# Patient Record
Sex: Female | Born: 1945
Health system: Southern US, Community
[De-identification: ages and names within clinical notes are randomized; demographics above are authoritative.]

## PROBLEM LIST (undated history)

## (undated) DIAGNOSIS — J449 Chronic obstructive pulmonary disease, unspecified: Secondary | ICD-10-CM

## (undated) DIAGNOSIS — F319 Bipolar disorder, unspecified: Secondary | ICD-10-CM

## (undated) DIAGNOSIS — I10 Essential (primary) hypertension: Secondary | ICD-10-CM

## (undated) DIAGNOSIS — R079 Chest pain, unspecified: Secondary | ICD-10-CM

## (undated) DIAGNOSIS — M1990 Unspecified osteoarthritis, unspecified site: Secondary | ICD-10-CM

## (undated) DIAGNOSIS — R0602 Shortness of breath: Secondary | ICD-10-CM

## (undated) DIAGNOSIS — M797 Fibromyalgia: Secondary | ICD-10-CM

## (undated) DIAGNOSIS — F909 Attention-deficit hyperactivity disorder, unspecified type: Secondary | ICD-10-CM

## (undated) DIAGNOSIS — I340 Nonrheumatic mitral (valve) insufficiency: Secondary | ICD-10-CM

## (undated) DIAGNOSIS — M48061 Spinal stenosis, lumbar region without neurogenic claudication: Secondary | ICD-10-CM

## (undated) DIAGNOSIS — M1712 Unilateral primary osteoarthritis, left knee: Secondary | ICD-10-CM

## (undated) DIAGNOSIS — E785 Hyperlipidemia, unspecified: Secondary | ICD-10-CM

## (undated) DIAGNOSIS — Z95810 Presence of automatic (implantable) cardiac defibrillator: Secondary | ICD-10-CM

## (undated) DIAGNOSIS — E079 Disorder of thyroid, unspecified: Secondary | ICD-10-CM

## (undated) DIAGNOSIS — G931 Anoxic brain damage, not elsewhere classified: Secondary | ICD-10-CM

## (undated) DIAGNOSIS — H543 Unqualified visual loss, both eyes: Secondary | ICD-10-CM

## (undated) DIAGNOSIS — I469 Cardiac arrest, cause unspecified: Secondary | ICD-10-CM

## (undated) HISTORY — DX: Presence of automatic (implantable) cardiac defibrillator: Z95.810

## (undated) HISTORY — DX: Anoxic brain damage, not elsewhere classified: G93.1

## (undated) HISTORY — DX: Cardiac arrest, cause unspecified: I46.9

## (undated) HISTORY — PX: PACEMAKER INSERTION: SHX728

## (undated) HISTORY — DX: Unqualified visual loss, both eyes: H54.3

## (undated) HISTORY — DX: Nonrheumatic mitral (valve) insufficiency: I34.0

## (undated) HISTORY — DX: Unilateral primary osteoarthritis, left knee: M17.12

## (undated) HISTORY — PX: TONSILLECTOMY: SUR1361

## (undated) HISTORY — DX: Spinal stenosis, lumbar region without neurogenic claudication: M48.061

## (undated) HISTORY — PX: ABDOMINAL HYSTERECTOMY: SHX81

## (undated) HISTORY — DX: Disorder of thyroid, unspecified: E07.9

## (undated) HISTORY — DX: Hyperlipidemia, unspecified: E78.5

## (undated) HISTORY — DX: Chronic obstructive pulmonary disease, unspecified: J44.9

## (undated) HISTORY — DX: Fibromyalgia: M79.7

## (undated) HISTORY — PX: COLON SURGERY: SHX602

## (undated) HISTORY — DX: Attention-deficit hyperactivity disorder, unspecified type: F90.9

## (undated) HISTORY — DX: Bipolar disorder, unspecified: F31.9

## (undated) HISTORY — PX: HAND SURGERY: SHX662

---

## 1989-10-17 HISTORY — PX: APPENDECTOMY: SHX54

## 1989-10-17 HISTORY — PX: TOTAL VAGINAL HYSTERECTOMY: SHX2548

## 2010-03-01 ENCOUNTER — Encounter: Payer: Self-pay | Admitting: Gastroenterology

## 2010-03-19 ENCOUNTER — Encounter: Payer: Self-pay | Admitting: Cardiology

## 2010-03-22 ENCOUNTER — Other Ambulatory Visit: Payer: Self-pay | Admitting: Gastroenterology

## 2010-03-22 ENCOUNTER — Ambulatory Visit
Admit: 2010-03-22 | Discharge: 2010-03-22 | Disposition: A | Discharge status: Home / Self Care | Payer: Self-pay | Source: Ambulatory Visit | Attending: Cardiology | Admitting: Cardiology

## 2010-03-22 HISTORY — DX: Chest pain, unspecified: R07.9

## 2010-03-22 HISTORY — DX: Essential (primary) hypertension: I10

## 2010-03-22 HISTORY — DX: Shortness of breath: R06.02

## 2010-03-22 HISTORY — DX: Unspecified osteoarthritis, unspecified site: M19.90

## 2010-03-22 MED ORDER — HEPARIN SODIUM (PORCINE) 1000 UNIT/ML IJ SOLN *WRAPPED*
Status: AC
Start: 2010-03-22 — End: 2010-03-22
  Filled 2010-03-22: qty 10

## 2010-03-22 MED ORDER — ASPIRIN 81 MG PO CHEW *I*
324.0000 mg | CHEWABLE_TABLET | Freq: Every day | ORAL | Status: AC
Start: 2010-03-22 — End: 2010-03-22
  Administered 2010-03-22: 324 mg via ORAL

## 2010-03-22 MED ORDER — ASPIRIN 81 MG PO CHEW *I*
CHEWABLE_TABLET | ORAL | Status: AC
Start: 2010-03-22 — End: 2010-03-22
  Filled 2010-03-22: qty 4

## 2010-03-22 MED ORDER — SODIUM CHLORIDE 0.9 % IV SOLN WRAPPED *I*
25.0000 mL/h | Status: DC
Start: 2010-03-22 — End: 2010-03-23
  Administered 2010-03-22: 25 mL/h via INTRAVENOUS

## 2010-03-22 MED ORDER — ACETAMINOPHEN 325 MG PO TABS *I*
325.0000 mg | ORAL_TABLET | ORAL | Status: DC | PRN
Start: 2010-03-22 — End: 2010-03-23

## 2010-03-22 MED ORDER — LIDOCAINE HCL 1 % IJ SOLN
INTRAMUSCULAR | Status: AC
Start: 2010-03-22 — End: 2010-03-22
  Filled 2010-03-22: qty 20

## 2010-03-22 MED ORDER — MIDAZOLAM HCL 1 MG/ML IJ SOLN *I* WRAPPED
INTRAMUSCULAR | Status: AC
Start: 2010-03-22 — End: 2010-03-22
  Filled 2010-03-22: qty 5

## 2010-03-22 MED ORDER — FENTANYL CITRATE 50 MCG/ML IJ SOLN *WRAPPED*
INTRAMUSCULAR | Status: AC
Start: 2010-03-22 — End: 2010-03-22
  Filled 2010-03-22: qty 2

## 2010-03-22 NOTE — Discharge Instructions (Signed)
[facility]  Cardiac Catheterization  Electrophysiology Labs  Patient Discharge Instructions      Date: 03/22/2010   Attending Physician: Dr Lenn Sink                                      Procedure: Left Heart Catheterization      FOLLOW-UP CARE  Call for an appointment with Dr. Corlis Leak  [address]  [address]  [place], [place] [address] [phone]  In 3 days as follow-up visit appointment post angiogram.    INSTRUCTIONS    Notify Dr Lennette Bihari, MD promptly if you experience any of the following symptoms: chest pain, shortness of breath, lightheadedness.    If you notice a sudden bright red bleeding or swelling at procedure site, apply firm pressure above the site and call 911.    If signs of infection such as redness, swelling, increased pain or fever, please call one of the following numbers:     Cardiac Catheterization Lab: [phone] between the hours of 8 am and 4:30 pm on Mon - Fri but during weekends, holidays and evening/night hours call:   Cardiac Cath Lab: [phone]   Electrophysiology Study Lab: [phone]    DIET    Resume your previous diet.  Do not drink alcoholic beverages for the next 24 hours.  Drink 8 glasses of water a day for 3 days.    CARE OF DRESSING OR INCISION    Keep incision dry and clean.  Remove dressing tomorrow.    PAIN MANAGEMENT/OTHER    Make no major decisions for the next 24 hours.  You have received medication that may make you sleepy.  Do not drive, drink alcohol, or operate machinery for 2 days.  For mild pain you can take Acetaminophen/Tylenol.    ACTIVITY    No lifting, pushing or pulling more than 5 lbs. (e.g. shoveling, mowing, raking, vacuuming) for 3-5 days.  Minimal stair climbing.    BATHING/SHOWERING    May use a pool, hot tub, in 7 days.  May shower next day.    PRESCRIPTIONS  Medication: no new medications.  Resume home meds.        Smoking Cessation:  Smoking counseling and booklet provided.  Refuses [facility] referral.  Not  ready to quit at this time.                               Bloodwork:   n/a    Additional Written Instructions Provided to Patient: No  If yes, specify: n/a    Provider Signature: Sandford Craze, NP  Date/Time: 03/22/2010 9:53 AM    Instructions reviewed with patient by: ______________________________________        Signature/Title   Date    I HAVE RECEIVED AND UNDERSTAND INSTRUCTIONS PROVIDED  ______________________________________________________________________    Signature of Patient or Significant Other (Enter Relationship to Patient if not Patient)

## 2010-03-22 NOTE — Progress Notes (Signed)
64 year old woman with chronic smoking history who presents with 3 years of chest heaviness for moderate exertion. However since the past few months symptoms have become more frequent and activity limiting. She had symptoms recently while in her PCP's office. She was referred to [facility] this. Based on her symptoms and risk factors she was considered high probability for CAD as an etiology for symptoms. She is referred for coronary angiogram today.      History:  Past Medical History   Diagnosis Date   . Hypertension    . Angina    . Chest pain, unspecified      pressure   . Shortness of breath    . Arthritis          Allergies: Allergies   Allergen Reactions   . Statins Support Therapy Rash           Prior to Admission Medications:      (Not in a hospital admission)      Active Hospital Medications:  Current outpatient prescriptions   Medication   . oxycodone-acetaminophen (PERCOCET) 5-325 MG per tablet   . alprazolam (XANAX) 0.5 MG tablet   . zolpidem (AMBIEN) 5 MG tablet   . ezetimibe (ZETIA) 10 MG tablet   . lisinopril (PRINIVIL,ZESTRIL) 20 MG tablet   . pregabalin (LYRICA) 75 MG capsule   . nitroGLYCERIN (NITROSTAT) 0.4 MG SL tablet   . diphenhydrAMINE (BENADRYL) 25 MG tablet   . aspirin 81 MG EC tablet   . Calcium Carbonate-Vitamin D (CALCIUM + D) 600-200 MG-UNIT per tablet   . Multiple Vitamin (MULTIVITAMIN) per tablet   . NONFORMULARY, OTHER, ORDER   Current facility-administered medications   Medication Dose Route Frequency   . sodium chloride IV  25 mL/hr Intravenous Continuous   . aspirin chewable tablet 324 mg  324 mg Oral Daily            Objective:     Physical Exam  Vitals:  Blood pressure 141/65, pulse 40, temperature 36.7 C (98.1 F), temperature source Temporal, resp. rate 20, height 1.524 m (5'), weight 68.5 kg (151 lb 0.2 oz), SpO2 98.00%.       Allens test:  normal    Pulses:   L radial 2 R radial 2   L Femoral 1 R Femoral 1   L posterior tibial 1 R posterior tibial 1   L dorsalis pedis 1 R  dorsalis pedis 1       Jugular venous pressure: normal  Airway Visibility: soft palate, uvula and posterior pharynx  Neuro exam: awake, alert, and oriented x 3  Breath sounds: clear  Cardiovascular:  normal S1,S2 without murmur, rubs, gallops  Abdomen:      Lab Review     No results found for this basename: NA,K,BUN,CREATININE,EGFR,HGB,HCT,PLT,INR in the last 168 hours      Anesthesiologist's Physical Status rating of the patient: Class III: Severe Systemic Disease    Plan for sedation: Moderate  I am evaluating the patient immediately prior to admission of sedation medication. The plan for sedation remains appropriate.      Assessment:    Indications for procedure: exertional angina     Plan:   Cardiac cath to rule out ischemic CAD.  8:37 AM  03/22/2010  [facility] RAO, MBBS    I personally saw and evaluated the patient.  I agree with the findings and care plan as documented above.

## 2010-03-24 LAB — POCT ASPIRIN PLATELET INHIBITION: Aspirin PLT Inhibition,POC: 405 {ARU}

## 2010-03-26 LAB — VRE SURVEILLANCE: VRE Surveillance: 0

## 2011-03-18 HISTORY — PX: REVISION TOTAL HIP ARTHROPLASTY: SHX766

## 2012-11-07 ENCOUNTER — Encounter: Payer: Self-pay | Admitting: Neurosurgery

## 2012-11-07 ENCOUNTER — Ambulatory Visit: Payer: Self-pay | Admitting: Neurosurgery

## 2012-11-07 VITALS — BP 132/78 | HR 76 | Ht 60.0 in | Wt 172.0 lb

## 2012-11-07 DIAGNOSIS — R200 Anesthesia of skin: Secondary | ICD-10-CM

## 2012-11-07 NOTE — Progress Notes (Signed)
Rinaldo Cloud, M.D.  [address]  [place], [place] [address]    Dear Dr. Delbert Phenix,    I had the pleasure of seeing Ms. Mashaw on 11-07-12 at 10 AM regarding history of left hand numbness.  As you will recall, the patient is a 67 year old right-handed divorced woman who recently worked factors with repetitive work.  Patient has a history of carpal tunnel for which she was evidently seen at [facility]. [facility] in 2010.  At that time, an EMG examination was performed by myself demonstrating a mild left carpal tunnel syndrome.  She was also noted to have a mild left C6 cervical radiculopathy at that time.  The patient indicates that she has continued to have numbness of both hands, predominantly the left hand.  She feels that the "whole hand" may go numb and that her symptoms "come and go".  She feels her right handgrip is weaker than it had been in the past.  Additionally, her left hand grip seemed to be weaker though she is not aware of specific numbness as she has experienced with the left hand.  She is having increased difficulties with activities of daily living.    Review of systems:    She's had a history of chronic low back pain as well as history of nausea at times.  All other review of systems are negative except for history of present illness and past medical history.    Social history:    History     Social History   . Marital Status: Divorced     Spouse Name: N/A     Number of Children: N/A   . Years of Education: N/A     Social History Main Topics   . Smoking status: Current Every Day Smoker -- 1.00 packs/day for 50 years   . Smokeless tobacco: Never Used   . Alcohol Use: No   . Drug Use: No   . Sexually Active: None     Other Topics Concern   . None     Social History Narrative   . None         Family history:    Family History   Problem Relation Age of Onset   . Heart disease Mother    . Heart disease Father    . Cancer Sister      Leukemia         Medications:    Current Outpatient Prescriptions    Medication Sig Dispense Refill   . fluticasone (FLONASE) 50 MCG/ACT nasal spray 2 sprays by Nasal route daily       . pitavastatin (LIVALO) 4 MG tablet Take 4 mg by mouth daily       . alendronate (FOSAMAX) 70 MG tablet Take 70 mg by mouth every 7 days   Take with a full glass of water. Do not eat or lie down for 30 min.       . metoprolol (TOPROL-XL) 25 MG 24 hr tablet Take 25 mg by mouth daily   Do not crush or chew. May be divided.       Marland Kitchen lisinopril-hydrochlorothiazide (PRINZIDE,ZESTORETIC) 20-12.5 MG per tablet Take 1 tablet by mouth every morning       . cetirizine (ZYRTEC) 10 MG tablet Take 10 mg by mouth daily       . isosorbide mononitrate (IMDUR) 30 MG 24 hr tablet Take 30 mg by mouth daily       . albuterol-ipratropium (COMBIVENT) 18-103 MCG/ACT inhaler Inhale  2 puffs into the lungs daily   Shake well before each use.       . methylphenidate (RITALIN) 10 MG tablet Take 10 mg by mouth 2 times daily (before meals)          . QUEtiapine (SEROQUEL) 50 MG tablet Take 50 mg by mouth 2 times daily       . melatonin 3 MG Take 3 mg by mouth nightly as needed       . Ascorbic Acid (VITAMIN C) 250 MG tablet Take 250 mg by mouth daily.       Marland Kitchen oxycodone-acetaminophen (PERCOCET) 5-325 MG per tablet Take 1 tablet by mouth every 4 hours as needed.       Marland Kitchen alprazolam (XANAX) 0.5 MG tablet Take 0.5 mg by mouth 2 times daily as needed.       . ezetimibe (ZETIA) 10 MG tablet Take 10 mg by mouth daily.       . pregabalin (LYRICA) 75 MG capsule Take 75 mg by mouth 2 times daily.       . nitroGLYCERIN (NITROSTAT) 0.4 MG SL tablet Place 0.4 mg under the tongue every 5 minutes as needed. May repeat x2, if pain persists call 911        . aspirin 81 MG EC tablet Take 81 mg by mouth daily.       . Calcium Carbonate-Vitamin D (CALCIUM + D) 600-200 MG-UNIT per tablet Take 2 tablets by mouth daily.       . Multiple Vitamin (MULTIVITAMIN) per tablet Take 1 tablet by mouth daily.       Marland Kitchen zolpidem (AMBIEN) 5 MG tablet Take 10 mg by  mouth nightly as needed.         No current facility-administered medications for this visit.         Allergies:  Seasonal and Statins support therapy      Past medical history:    Past Medical History   Diagnosis Date   . Hypertension    . Angina    . Chest pain, unspecified      pressure   . Shortness of breath    . Arthritis    . COPD (chronic obstructive pulmonary disease)    . ADHD (attention deficit hyperactivity disorder)    . Fibromyalgia    . Bipolar 1 disorder          Past surgical history:      Past Surgical History   Procedure Laterality Date   . Tonsillectomy     . Colon surgery       abcess   . Hand surgery Bilateral      trigger finger bilateral         Examination:  Filed Vitals:    11/07/12 1005   BP: 132/78   Pulse: 76   Height: 1.524 m (5')   Weight: 78.019 kg (172 lb)       Cardiac auscultation and auscultation of the carotids and ocular orbits were unremarkable.    Neurologic examination:  Mental status:  The patient is awake, alert, and fluent.  She appears in no acute distress.    Cranial nerves:  Examination of cranial nerves II through XII reveal pupils to be equal and reactive to light, extraocular movements are intact.  Facial features are symmetric.  Tongue is midline, soft palate elevates symmetrically.  Hearing is intact to finger rub bilaterally.    Motor examination:  On motor examination, she has normal strength, tone,  and bulk throughout the 4 extremities, both proximally and distally.    Sensory examination:  On sensory examination, she has 22(R), 25(L) seconds of vibration at the distal index finger bilaterally in the upper extremities and 8 seconds of vibratory sensation at the distal first toe in the lower extremities bilaterally.    Deep tendon reflexes:  She has 2+ responses of the biceps, triceps, brachioradialis, knees, and ankles bilaterally.  Plantar responses are downgoing bilaterally.  Tinel sign was absent at both wrists.    Coordination:  Normal including  gait.    Impression:  1.  At the present time, the patient has history of left carpal tunnel syndrome.  She believes that this has been progressing.  Additionally, she seems to be experiencing some increased symptomatology the right upper extremity as well.  The possible cervical radiculopathies is not excluded.    Recommendations:  1.  The patient is offered EMG examination of bilateral upper extremities which will be scheduled for her.    2.  She is also asked to sign release of records from [facility]. [facility].     3.  Followup one month.    Thank you for your referral of Ms. Freida Busman for consultation.  I hope these observations and information will be helpful to you in your follow-up with her in the future.  Please feel free to contact me if I can be of further assistance.    Sincerely,        Aurora Mask, M.D.

## 2012-12-12 ENCOUNTER — Ambulatory Visit: Payer: Self-pay | Admitting: Neurosurgery

## 2012-12-17 ENCOUNTER — Ambulatory Visit: Payer: Self-pay | Admitting: Neurosurgery

## 2013-01-09 ENCOUNTER — Ambulatory Visit: Payer: Self-pay | Admitting: Neurosurgery

## 2013-01-09 ENCOUNTER — Encounter: Payer: Self-pay | Admitting: Neurosurgery

## 2013-01-09 ENCOUNTER — Encounter: Payer: Self-pay | Admitting: Gastroenterology

## 2013-01-09 VITALS — BP 110/60 | HR 72 | Ht 60.0 in | Wt 175.5 lb

## 2013-01-09 DIAGNOSIS — G5603 Carpal tunnel syndrome, bilateral upper limbs: Secondary | ICD-10-CM

## 2013-01-09 DIAGNOSIS — M5412 Radiculopathy, cervical region: Secondary | ICD-10-CM

## 2013-01-09 NOTE — Procedures (Signed)
This office note has been dictated.  (Media)

## 2013-01-09 NOTE — Progress Notes (Signed)
Filed Vitals:    01/09/13 1351   BP: 110/60   Pulse: 72   Height: 1.524 m (5')   Weight: 79.606 kg (175 lb 8 oz)     Patient seen on 01-09-13 for EMG examination.  See procedure note(Media).    Farley Ly.D.

## 2013-01-21 ENCOUNTER — Ambulatory Visit: Payer: Self-pay | Admitting: Neurosurgery

## 2013-01-21 ENCOUNTER — Encounter: Payer: Self-pay | Admitting: Neurosurgery

## 2013-01-21 VITALS — BP 116/64 | HR 80 | Ht 60.0 in | Wt 176.0 lb

## 2013-01-21 DIAGNOSIS — G5603 Carpal tunnel syndrome, bilateral upper limbs: Secondary | ICD-10-CM

## 2013-01-21 NOTE — Progress Notes (Signed)
Filed Vitals:    01/21/13 1113   BP: 116/64   Pulse: 80   Height: 1.524 m (5')   Weight: 79.833 kg (176 lb)     Patient seen in followup on 01-21-13 at 11 AM regarding bilateral carpal tunnel syndrome.    As noted previously in 2010, the patient had an EMG examination performed of the left upper extremity.  That exam demonstrated a left carpal tunnel syndrome that was mild in degree.  She indicates that she "baby it" such that it has improved somewhat.  On the other hand, she has a mild-moderate right carpal tunnel syndrome on her recent examination.    The patient was counseled with regards to consideration of wrist splinting and occupational therapy consultation regarding the right hand symptoms.  At the present time, she indicates that she does not wish any referrals and insists that if she is careful with the right hand, this will also improve.  She was further advised with regards to the potential further injury of the median nerve and its potential for disability of hand grip with thenar atrophy and loss of sensation.  She understands the risks and is willing to accept the risks of potential further injury to her hand function.    Current Outpatient Prescriptions   Medication Sig Dispense Refill   . pitavastatin (LIVALO) 4 MG tablet Take 4 mg by mouth daily       . alendronate (FOSAMAX) 70 MG tablet Take 70 mg by mouth every 7 days   Take with a full glass of water. Do not eat or lie down for 30 min.       . metoprolol (TOPROL-XL) 25 MG 24 hr tablet Take 25 mg by mouth daily   Do not crush or chew. May be divided.       Marland Kitchen lisinopril-hydrochlorothiazide (PRINZIDE,ZESTORETIC) 20-12.5 MG per tablet Take 1 tablet by mouth every morning       . cetirizine (ZYRTEC) 10 MG tablet Take 10 mg by mouth daily       . isosorbide mononitrate (IMDUR) 30 MG 24 hr tablet Take 30 mg by mouth daily       . albuterol-ipratropium (COMBIVENT) 18-103 MCG/ACT inhaler Inhale 2 puffs into the lungs daily   Shake well before each use.        . methylphenidate (RITALIN) 10 MG tablet Take 10 mg by mouth 2 times daily (before meals)          . QUEtiapine (SEROQUEL) 50 MG tablet Take 150 mg by mouth nightly          . melatonin 3 MG Take 3 mg by mouth nightly as needed       . Ascorbic Acid (VITAMIN C) 250 MG tablet Take 250 mg by mouth daily.       Marland Kitchen oxycodone-acetaminophen (PERCOCET) 5-325 MG per tablet Take 1 tablet by mouth every 4 hours as needed.       Marland Kitchen alprazolam (XANAX) 0.5 MG tablet Take 0.5 mg by mouth 2 times daily as needed.       . ezetimibe (ZETIA) 10 MG tablet Take 10 mg by mouth daily.       . pregabalin (LYRICA) 75 MG capsule Take 75 mg by mouth 2 times daily.       . nitroGLYCERIN (NITROSTAT) 0.4 MG SL tablet Place 0.4 mg under the tongue every 5 minutes as needed. May repeat x2, if pain persists call 911        .  aspirin 81 MG EC tablet Take 81 mg by mouth daily.       . Calcium Carbonate-Vitamin D (CALCIUM + D) 600-200 MG-UNIT per tablet Take 2 tablets by mouth daily.       . Multiple Vitamin (MULTIVITAMIN) per tablet Take 1 tablet by mouth daily.         No current facility-administered medications for this visit.     Examination:  Patient is awake alert and fluent.  Patient appears in no acute distress.    Gait is normal.    Impression:  1.  Bilateral carpal tunnel syndrome as described above.  Patient currently refuses any specific intervention.  However, she will let me know if she changes her mind.  Previously demonstrated mild C6 radiculopathy on the left has been stable and does not appear to require any intervention presently.    Recommendations:  1.  She will plan to followup on a when necessary basis.    Aurora Mask M.D.  Copy to:  Rinaldo Cloud, M.D.  [address]  [place], [place] [address]

## 2014-12-19 ENCOUNTER — Telehealth: Payer: Self-pay

## 2015-01-07 NOTE — Telephone Encounter (Signed)
error 

## 2015-01-15 ENCOUNTER — Ambulatory Visit
Admit: 2015-01-15 | Discharge: 2015-01-15 | Disposition: A | Discharge status: Home / Self Care | Payer: Self-pay | Source: Ambulatory Visit | Attending: Colon and Rectal Surgery | Admitting: Colon and Rectal Surgery

## 2015-01-16 LAB — SURGICAL PATHOLOGY

## 2015-08-03 ENCOUNTER — Encounter: Payer: Self-pay | Admitting: Gastroenterology

## 2015-10-01 ENCOUNTER — Ambulatory Visit: Payer: Self-pay | Admitting: Cardiology

## 2015-11-17 ENCOUNTER — Encounter: Payer: Self-pay | Admitting: Cardiology

## 2015-11-19 DIAGNOSIS — I4892 Unspecified atrial flutter: Secondary | ICD-10-CM | POA: Insufficient documentation

## 2015-11-19 DIAGNOSIS — Z95810 Presence of automatic (implantable) cardiac defibrillator: Secondary | ICD-10-CM | POA: Insufficient documentation

## 2015-11-19 DIAGNOSIS — I420 Dilated cardiomyopathy: Secondary | ICD-10-CM | POA: Insufficient documentation

## 2015-11-26 ENCOUNTER — Ambulatory Visit: Payer: Self-pay | Admitting: Cardiology

## 2016-01-18 DIAGNOSIS — I951 Orthostatic hypotension: Secondary | ICD-10-CM | POA: Insufficient documentation

## 2016-01-18 DIAGNOSIS — I340 Nonrheumatic mitral (valve) insufficiency: Secondary | ICD-10-CM | POA: Insufficient documentation

## 2016-01-18 DIAGNOSIS — I4729 Other ventricular tachycardia: Secondary | ICD-10-CM | POA: Insufficient documentation

## 2016-11-24 DIAGNOSIS — I429 Cardiomyopathy, unspecified: Secondary | ICD-10-CM | POA: Diagnosis not present

## 2016-11-24 DIAGNOSIS — Z4502 Encounter for adjustment and management of automatic implantable cardiac defibrillator: Secondary | ICD-10-CM | POA: Diagnosis not present

## 2016-11-29 DIAGNOSIS — J029 Acute pharyngitis, unspecified: Secondary | ICD-10-CM | POA: Diagnosis not present

## 2016-11-29 DIAGNOSIS — R05 Cough: Secondary | ICD-10-CM | POA: Diagnosis not present

## 2016-11-29 DIAGNOSIS — Z20828 Contact with and (suspected) exposure to other viral communicable diseases: Secondary | ICD-10-CM | POA: Diagnosis not present

## 2016-12-13 DIAGNOSIS — C44321 Squamous cell carcinoma of skin of nose: Secondary | ICD-10-CM | POA: Diagnosis not present

## 2016-12-20 DIAGNOSIS — Z1231 Encounter for screening mammogram for malignant neoplasm of breast: Secondary | ICD-10-CM | POA: Diagnosis not present

## 2016-12-28 DIAGNOSIS — R928 Other abnormal and inconclusive findings on diagnostic imaging of breast: Secondary | ICD-10-CM | POA: Diagnosis not present

## 2017-01-09 DIAGNOSIS — R928 Other abnormal and inconclusive findings on diagnostic imaging of breast: Secondary | ICD-10-CM | POA: Diagnosis not present

## 2017-01-23 DIAGNOSIS — Z95810 Presence of automatic (implantable) cardiac defibrillator: Secondary | ICD-10-CM | POA: Diagnosis not present

## 2017-03-05 DIAGNOSIS — Z1212 Encounter for screening for malignant neoplasm of rectum: Secondary | ICD-10-CM | POA: Diagnosis not present

## 2017-03-05 DIAGNOSIS — Z1211 Encounter for screening for malignant neoplasm of colon: Secondary | ICD-10-CM | POA: Diagnosis not present

## 2017-03-10 LAB — COLOGUARD: Cologuard: NEGATIVE

## 2017-03-14 DIAGNOSIS — L821 Other seborrheic keratosis: Secondary | ICD-10-CM | POA: Diagnosis not present

## 2017-03-14 DIAGNOSIS — L570 Actinic keratosis: Secondary | ICD-10-CM | POA: Diagnosis not present

## 2017-03-14 DIAGNOSIS — L853 Xerosis cutis: Secondary | ICD-10-CM | POA: Diagnosis not present

## 2017-03-14 DIAGNOSIS — L814 Other melanin hyperpigmentation: Secondary | ICD-10-CM | POA: Diagnosis not present

## 2017-04-10 DIAGNOSIS — H2513 Age-related nuclear cataract, bilateral: Secondary | ICD-10-CM | POA: Diagnosis not present

## 2017-04-24 DIAGNOSIS — Z95810 Presence of automatic (implantable) cardiac defibrillator: Secondary | ICD-10-CM | POA: Diagnosis not present

## 2017-05-18 DIAGNOSIS — M25552 Pain in left hip: Secondary | ICD-10-CM | POA: Diagnosis not present

## 2017-05-18 DIAGNOSIS — M5416 Radiculopathy, lumbar region: Secondary | ICD-10-CM | POA: Diagnosis not present

## 2017-05-24 DIAGNOSIS — M25552 Pain in left hip: Secondary | ICD-10-CM | POA: Diagnosis not present

## 2017-05-24 DIAGNOSIS — M545 Low back pain: Secondary | ICD-10-CM | POA: Diagnosis not present

## 2017-05-24 DIAGNOSIS — M5416 Radiculopathy, lumbar region: Secondary | ICD-10-CM | POA: Diagnosis not present

## 2017-05-29 DIAGNOSIS — M25552 Pain in left hip: Secondary | ICD-10-CM | POA: Diagnosis not present

## 2017-05-29 DIAGNOSIS — M5416 Radiculopathy, lumbar region: Secondary | ICD-10-CM | POA: Diagnosis not present

## 2017-05-29 DIAGNOSIS — M545 Low back pain: Secondary | ICD-10-CM | POA: Diagnosis not present

## 2017-06-02 DIAGNOSIS — M5416 Radiculopathy, lumbar region: Secondary | ICD-10-CM | POA: Diagnosis not present

## 2017-06-02 DIAGNOSIS — M545 Low back pain: Secondary | ICD-10-CM | POA: Diagnosis not present

## 2017-06-02 DIAGNOSIS — M25552 Pain in left hip: Secondary | ICD-10-CM | POA: Diagnosis not present

## 2017-06-05 DIAGNOSIS — M5416 Radiculopathy, lumbar region: Secondary | ICD-10-CM | POA: Diagnosis not present

## 2017-06-05 DIAGNOSIS — M25552 Pain in left hip: Secondary | ICD-10-CM | POA: Diagnosis not present

## 2017-06-05 DIAGNOSIS — M545 Low back pain: Secondary | ICD-10-CM | POA: Diagnosis not present

## 2017-06-08 DIAGNOSIS — M25552 Pain in left hip: Secondary | ICD-10-CM | POA: Diagnosis not present

## 2017-06-08 DIAGNOSIS — M5416 Radiculopathy, lumbar region: Secondary | ICD-10-CM | POA: Diagnosis not present

## 2017-06-08 DIAGNOSIS — M545 Low back pain: Secondary | ICD-10-CM | POA: Diagnosis not present

## 2017-06-12 DIAGNOSIS — M545 Low back pain: Secondary | ICD-10-CM | POA: Diagnosis not present

## 2017-06-12 DIAGNOSIS — M5416 Radiculopathy, lumbar region: Secondary | ICD-10-CM | POA: Diagnosis not present

## 2017-06-12 DIAGNOSIS — M25552 Pain in left hip: Secondary | ICD-10-CM | POA: Diagnosis not present

## 2017-06-29 DIAGNOSIS — E782 Mixed hyperlipidemia: Secondary | ICD-10-CM | POA: Diagnosis not present

## 2017-06-29 DIAGNOSIS — I5032 Chronic diastolic (congestive) heart failure: Secondary | ICD-10-CM | POA: Diagnosis not present

## 2017-06-29 DIAGNOSIS — E663 Overweight: Secondary | ICD-10-CM | POA: Diagnosis not present

## 2017-06-30 DIAGNOSIS — S62639A Displaced fracture of distal phalanx of unspecified finger, initial encounter for closed fracture: Secondary | ICD-10-CM | POA: Diagnosis not present

## 2017-07-03 DIAGNOSIS — Z0001 Encounter for general adult medical examination with abnormal findings: Secondary | ICD-10-CM | POA: Diagnosis not present

## 2017-07-03 DIAGNOSIS — E782 Mixed hyperlipidemia: Secondary | ICD-10-CM | POA: Diagnosis not present

## 2017-07-03 DIAGNOSIS — Z23 Encounter for immunization: Secondary | ICD-10-CM | POA: Diagnosis not present

## 2017-07-03 DIAGNOSIS — Z6827 Body mass index (BMI) 27.0-27.9, adult: Secondary | ICD-10-CM | POA: Diagnosis not present

## 2017-07-03 DIAGNOSIS — E034 Atrophy of thyroid (acquired): Secondary | ICD-10-CM | POA: Diagnosis not present

## 2017-07-03 DIAGNOSIS — S60041A Contusion of right ring finger without damage to nail, initial encounter: Secondary | ICD-10-CM | POA: Diagnosis not present

## 2017-07-07 DIAGNOSIS — S62639A Displaced fracture of distal phalanx of unspecified finger, initial encounter for closed fracture: Secondary | ICD-10-CM | POA: Diagnosis not present

## 2017-07-21 DIAGNOSIS — S62639A Displaced fracture of distal phalanx of unspecified finger, initial encounter for closed fracture: Secondary | ICD-10-CM | POA: Diagnosis not present

## 2017-07-24 DIAGNOSIS — Z950 Presence of cardiac pacemaker: Secondary | ICD-10-CM | POA: Diagnosis not present

## 2017-07-25 DIAGNOSIS — S52511A Displaced fracture of right radial styloid process, initial encounter for closed fracture: Secondary | ICD-10-CM | POA: Diagnosis not present

## 2017-08-11 DIAGNOSIS — S62639B Displaced fracture of distal phalanx of unspecified finger, initial encounter for open fracture: Secondary | ICD-10-CM | POA: Diagnosis not present

## 2017-08-11 DIAGNOSIS — S52611A Displaced fracture of right ulna styloid process, initial encounter for closed fracture: Secondary | ICD-10-CM | POA: Diagnosis not present

## 2017-08-14 DIAGNOSIS — E038 Other specified hypothyroidism: Secondary | ICD-10-CM | POA: Diagnosis not present

## 2017-09-11 DIAGNOSIS — S52611A Displaced fracture of right ulna styloid process, initial encounter for closed fracture: Secondary | ICD-10-CM | POA: Diagnosis not present

## 2017-09-14 DIAGNOSIS — E038 Other specified hypothyroidism: Secondary | ICD-10-CM | POA: Diagnosis not present

## 2017-10-05 DIAGNOSIS — E782 Mixed hyperlipidemia: Secondary | ICD-10-CM | POA: Diagnosis not present

## 2017-10-05 DIAGNOSIS — I5032 Chronic diastolic (congestive) heart failure: Secondary | ICD-10-CM | POA: Diagnosis not present

## 2017-10-05 DIAGNOSIS — M1712 Unilateral primary osteoarthritis, left knee: Secondary | ICD-10-CM | POA: Diagnosis not present

## 2017-10-05 DIAGNOSIS — E038 Other specified hypothyroidism: Secondary | ICD-10-CM | POA: Diagnosis not present

## 2017-10-06 DIAGNOSIS — I5032 Chronic diastolic (congestive) heart failure: Secondary | ICD-10-CM | POA: Diagnosis not present

## 2017-10-06 DIAGNOSIS — E782 Mixed hyperlipidemia: Secondary | ICD-10-CM | POA: Diagnosis not present

## 2017-10-23 DIAGNOSIS — Z4502 Encounter for adjustment and management of automatic implantable cardiac defibrillator: Secondary | ICD-10-CM | POA: Diagnosis not present

## 2017-10-23 DIAGNOSIS — N3001 Acute cystitis with hematuria: Secondary | ICD-10-CM | POA: Diagnosis not present

## 2017-11-02 DIAGNOSIS — C44311 Basal cell carcinoma of skin of nose: Secondary | ICD-10-CM | POA: Diagnosis not present

## 2017-12-04 DIAGNOSIS — I340 Nonrheumatic mitral (valve) insufficiency: Secondary | ICD-10-CM | POA: Diagnosis not present

## 2017-12-04 DIAGNOSIS — Z95810 Presence of automatic (implantable) cardiac defibrillator: Secondary | ICD-10-CM | POA: Diagnosis not present

## 2017-12-04 DIAGNOSIS — I420 Dilated cardiomyopathy: Secondary | ICD-10-CM | POA: Diagnosis not present

## 2017-12-04 DIAGNOSIS — I472 Ventricular tachycardia: Secondary | ICD-10-CM | POA: Diagnosis not present

## 2017-12-14 DIAGNOSIS — Z4502 Encounter for adjustment and management of automatic implantable cardiac defibrillator: Secondary | ICD-10-CM | POA: Diagnosis not present

## 2017-12-14 DIAGNOSIS — I472 Ventricular tachycardia: Secondary | ICD-10-CM | POA: Diagnosis not present

## 2017-12-14 DIAGNOSIS — Z95810 Presence of automatic (implantable) cardiac defibrillator: Secondary | ICD-10-CM | POA: Diagnosis not present

## 2018-01-11 DIAGNOSIS — E038 Other specified hypothyroidism: Secondary | ICD-10-CM | POA: Diagnosis not present

## 2018-01-11 DIAGNOSIS — M1712 Unilateral primary osteoarthritis, left knee: Secondary | ICD-10-CM | POA: Diagnosis not present

## 2018-01-11 DIAGNOSIS — I5032 Chronic diastolic (congestive) heart failure: Secondary | ICD-10-CM | POA: Diagnosis not present

## 2018-01-11 DIAGNOSIS — E782 Mixed hyperlipidemia: Secondary | ICD-10-CM | POA: Diagnosis not present

## 2018-01-12 DIAGNOSIS — I5032 Chronic diastolic (congestive) heart failure: Secondary | ICD-10-CM | POA: Diagnosis not present

## 2018-01-12 DIAGNOSIS — E782 Mixed hyperlipidemia: Secondary | ICD-10-CM | POA: Diagnosis not present

## 2018-01-12 DIAGNOSIS — E038 Other specified hypothyroidism: Secondary | ICD-10-CM | POA: Diagnosis not present

## 2018-01-19 DIAGNOSIS — E782 Mixed hyperlipidemia: Secondary | ICD-10-CM | POA: Diagnosis not present

## 2018-01-19 DIAGNOSIS — J060 Acute laryngopharyngitis: Secondary | ICD-10-CM | POA: Diagnosis not present

## 2018-02-13 DIAGNOSIS — I340 Nonrheumatic mitral (valve) insufficiency: Secondary | ICD-10-CM | POA: Diagnosis not present

## 2018-03-15 DIAGNOSIS — Z4502 Encounter for adjustment and management of automatic implantable cardiac defibrillator: Secondary | ICD-10-CM | POA: Diagnosis not present

## 2018-03-19 DIAGNOSIS — D225 Melanocytic nevi of trunk: Secondary | ICD-10-CM | POA: Diagnosis not present

## 2018-03-19 DIAGNOSIS — L820 Inflamed seborrheic keratosis: Secondary | ICD-10-CM | POA: Diagnosis not present

## 2018-03-19 DIAGNOSIS — L578 Other skin changes due to chronic exposure to nonionizing radiation: Secondary | ICD-10-CM | POA: Diagnosis not present

## 2018-03-19 DIAGNOSIS — R233 Spontaneous ecchymoses: Secondary | ICD-10-CM | POA: Diagnosis not present

## 2018-05-01 DIAGNOSIS — N3000 Acute cystitis without hematuria: Secondary | ICD-10-CM | POA: Diagnosis not present

## 2018-05-28 DIAGNOSIS — H2513 Age-related nuclear cataract, bilateral: Secondary | ICD-10-CM | POA: Diagnosis not present

## 2018-06-25 DIAGNOSIS — C4442 Squamous cell carcinoma of skin of scalp and neck: Secondary | ICD-10-CM | POA: Diagnosis not present

## 2018-06-25 DIAGNOSIS — L570 Actinic keratosis: Secondary | ICD-10-CM | POA: Diagnosis not present

## 2018-08-01 DIAGNOSIS — R1013 Epigastric pain: Secondary | ICD-10-CM | POA: Diagnosis not present

## 2018-08-01 DIAGNOSIS — Z23 Encounter for immunization: Secondary | ICD-10-CM | POA: Diagnosis not present

## 2018-08-01 DIAGNOSIS — R0602 Shortness of breath: Secondary | ICD-10-CM | POA: Diagnosis not present

## 2018-08-01 DIAGNOSIS — M545 Low back pain: Secondary | ICD-10-CM | POA: Diagnosis not present

## 2018-08-09 DIAGNOSIS — I472 Ventricular tachycardia: Secondary | ICD-10-CM | POA: Diagnosis not present

## 2018-08-09 DIAGNOSIS — Z4502 Encounter for adjustment and management of automatic implantable cardiac defibrillator: Secondary | ICD-10-CM | POA: Diagnosis not present

## 2018-08-30 DIAGNOSIS — J101 Influenza due to other identified influenza virus with other respiratory manifestations: Secondary | ICD-10-CM | POA: Diagnosis not present

## 2018-09-04 DIAGNOSIS — Z0001 Encounter for general adult medical examination with abnormal findings: Secondary | ICD-10-CM | POA: Diagnosis not present

## 2018-09-04 DIAGNOSIS — Z6826 Body mass index (BMI) 26.0-26.9, adult: Secondary | ICD-10-CM | POA: Diagnosis not present

## 2018-09-04 DIAGNOSIS — Z1231 Encounter for screening mammogram for malignant neoplasm of breast: Secondary | ICD-10-CM | POA: Diagnosis not present

## 2018-09-04 DIAGNOSIS — Z13820 Encounter for screening for osteoporosis: Secondary | ICD-10-CM | POA: Diagnosis not present

## 2018-09-11 DIAGNOSIS — R2689 Other abnormalities of gait and mobility: Secondary | ICD-10-CM | POA: Diagnosis not present

## 2018-09-11 DIAGNOSIS — M25551 Pain in right hip: Secondary | ICD-10-CM | POA: Diagnosis not present

## 2018-09-11 DIAGNOSIS — R293 Abnormal posture: Secondary | ICD-10-CM | POA: Diagnosis not present

## 2018-09-11 DIAGNOSIS — M6281 Muscle weakness (generalized): Secondary | ICD-10-CM | POA: Diagnosis not present

## 2018-09-11 DIAGNOSIS — M79652 Pain in left thigh: Secondary | ICD-10-CM | POA: Diagnosis not present

## 2018-09-11 DIAGNOSIS — M2560 Stiffness of unspecified joint, not elsewhere classified: Secondary | ICD-10-CM | POA: Diagnosis not present

## 2018-09-11 DIAGNOSIS — M545 Low back pain: Secondary | ICD-10-CM | POA: Diagnosis not present

## 2018-09-14 DIAGNOSIS — Z4502 Encounter for adjustment and management of automatic implantable cardiac defibrillator: Secondary | ICD-10-CM | POA: Diagnosis not present

## 2018-09-20 DIAGNOSIS — E038 Other specified hypothyroidism: Secondary | ICD-10-CM | POA: Diagnosis not present

## 2018-09-20 DIAGNOSIS — E782 Mixed hyperlipidemia: Secondary | ICD-10-CM | POA: Diagnosis not present

## 2018-09-20 DIAGNOSIS — I5032 Chronic diastolic (congestive) heart failure: Secondary | ICD-10-CM | POA: Diagnosis not present

## 2018-09-21 DIAGNOSIS — I5032 Chronic diastolic (congestive) heart failure: Secondary | ICD-10-CM | POA: Diagnosis not present

## 2018-09-21 DIAGNOSIS — M545 Low back pain: Secondary | ICD-10-CM | POA: Diagnosis not present

## 2018-09-21 DIAGNOSIS — M6281 Muscle weakness (generalized): Secondary | ICD-10-CM | POA: Diagnosis not present

## 2018-09-21 DIAGNOSIS — M25551 Pain in right hip: Secondary | ICD-10-CM | POA: Diagnosis not present

## 2018-09-21 DIAGNOSIS — E782 Mixed hyperlipidemia: Secondary | ICD-10-CM | POA: Diagnosis not present

## 2018-09-21 DIAGNOSIS — M1712 Unilateral primary osteoarthritis, left knee: Secondary | ICD-10-CM | POA: Diagnosis not present

## 2018-09-21 DIAGNOSIS — R2689 Other abnormalities of gait and mobility: Secondary | ICD-10-CM | POA: Diagnosis not present

## 2018-09-21 DIAGNOSIS — R293 Abnormal posture: Secondary | ICD-10-CM | POA: Diagnosis not present

## 2018-09-21 DIAGNOSIS — M2560 Stiffness of unspecified joint, not elsewhere classified: Secondary | ICD-10-CM | POA: Diagnosis not present

## 2018-09-21 DIAGNOSIS — E038 Other specified hypothyroidism: Secondary | ICD-10-CM | POA: Diagnosis not present

## 2018-09-21 DIAGNOSIS — M79652 Pain in left thigh: Secondary | ICD-10-CM | POA: Diagnosis not present

## 2018-10-15 DIAGNOSIS — M545 Low back pain: Secondary | ICD-10-CM | POA: Diagnosis not present

## 2018-10-22 DIAGNOSIS — N959 Unspecified menopausal and perimenopausal disorder: Secondary | ICD-10-CM | POA: Diagnosis not present

## 2018-10-22 DIAGNOSIS — Z1231 Encounter for screening mammogram for malignant neoplasm of breast: Secondary | ICD-10-CM | POA: Diagnosis not present

## 2018-10-22 DIAGNOSIS — M85851 Other specified disorders of bone density and structure, right thigh: Secondary | ICD-10-CM | POA: Diagnosis not present

## 2018-10-22 DIAGNOSIS — M8580 Other specified disorders of bone density and structure, unspecified site: Secondary | ICD-10-CM | POA: Diagnosis not present

## 2018-10-22 LAB — HM DEXA SCAN

## 2018-11-05 DIAGNOSIS — E038 Other specified hypothyroidism: Secondary | ICD-10-CM | POA: Diagnosis not present

## 2018-11-05 DIAGNOSIS — I10 Essential (primary) hypertension: Secondary | ICD-10-CM | POA: Diagnosis not present

## 2018-11-05 DIAGNOSIS — E782 Mixed hyperlipidemia: Secondary | ICD-10-CM | POA: Diagnosis not present

## 2018-11-06 DIAGNOSIS — M545 Low back pain: Secondary | ICD-10-CM | POA: Diagnosis not present

## 2018-11-06 DIAGNOSIS — E034 Atrophy of thyroid (acquired): Secondary | ICD-10-CM | POA: Diagnosis not present

## 2018-11-06 DIAGNOSIS — Z6828 Body mass index (BMI) 28.0-28.9, adult: Secondary | ICD-10-CM | POA: Diagnosis not present

## 2018-11-06 DIAGNOSIS — E782 Mixed hyperlipidemia: Secondary | ICD-10-CM | POA: Diagnosis not present

## 2018-11-06 DIAGNOSIS — I5032 Chronic diastolic (congestive) heart failure: Secondary | ICD-10-CM | POA: Diagnosis not present

## 2018-11-29 DIAGNOSIS — M4726 Other spondylosis with radiculopathy, lumbar region: Secondary | ICD-10-CM | POA: Diagnosis not present

## 2018-11-29 DIAGNOSIS — M4156 Other secondary scoliosis, lumbar region: Secondary | ICD-10-CM | POA: Diagnosis not present

## 2018-11-29 DIAGNOSIS — M5431 Sciatica, right side: Secondary | ICD-10-CM | POA: Diagnosis not present

## 2018-12-10 DIAGNOSIS — I420 Dilated cardiomyopathy: Secondary | ICD-10-CM | POA: Diagnosis not present

## 2018-12-13 DIAGNOSIS — Z4502 Encounter for adjustment and management of automatic implantable cardiac defibrillator: Secondary | ICD-10-CM | POA: Diagnosis not present

## 2018-12-17 DIAGNOSIS — I420 Dilated cardiomyopathy: Secondary | ICD-10-CM | POA: Diagnosis not present

## 2018-12-18 DIAGNOSIS — M5431 Sciatica, right side: Secondary | ICD-10-CM | POA: Diagnosis not present

## 2018-12-18 DIAGNOSIS — M4156 Other secondary scoliosis, lumbar region: Secondary | ICD-10-CM | POA: Diagnosis not present

## 2018-12-18 DIAGNOSIS — M4726 Other spondylosis with radiculopathy, lumbar region: Secondary | ICD-10-CM | POA: Diagnosis not present

## 2018-12-20 ENCOUNTER — Telehealth: Payer: Self-pay

## 2018-12-20 ENCOUNTER — Other Ambulatory Visit: Payer: Self-pay | Admitting: Orthopedic Surgery

## 2018-12-20 DIAGNOSIS — M4156 Other secondary scoliosis, lumbar region: Principal | ICD-10-CM

## 2018-12-20 NOTE — Telephone Encounter (Signed)
Spoke with patient to screen her medications and drug allergies prior to being scheduled for a myelogram.  She was informed she will be here 2-2.5 hours, needs a driver and will need to be on bedrest for 24 hours after the procedure.  Gypsy Lore, RN

## 2018-12-31 ENCOUNTER — Ambulatory Visit
Admission: RE | Admit: 2018-12-31 | Discharge: 2018-12-31 | Disposition: A | Discharge status: Home / Self Care | Payer: Medicare Other | Source: Ambulatory Visit | Attending: Orthopedic Surgery | Admitting: Orthopedic Surgery

## 2018-12-31 ENCOUNTER — Other Ambulatory Visit: Payer: Self-pay

## 2018-12-31 DIAGNOSIS — M48061 Spinal stenosis, lumbar region without neurogenic claudication: Secondary | ICD-10-CM | POA: Diagnosis not present

## 2018-12-31 DIAGNOSIS — M4156 Other secondary scoliosis, lumbar region: Principal | ICD-10-CM

## 2018-12-31 MED ORDER — IOPAMIDOL (ISOVUE-M 200) INJECTION 41%
15.0000 mL | Freq: Once | INTRAMUSCULAR | Status: AC
  Administered 2018-12-31: 15 mL via INTRATHECAL

## 2018-12-31 MED ORDER — DIAZEPAM 5 MG PO TABS
5.0000 mg | ORAL_TABLET | Freq: Once | ORAL | Status: AC
  Administered 2018-12-31: 5 mg via ORAL

## 2018-12-31 NOTE — Discharge Instructions (Signed)

## 2019-01-08 DIAGNOSIS — Z6827 Body mass index (BMI) 27.0-27.9, adult: Secondary | ICD-10-CM | POA: Diagnosis not present

## 2019-01-08 DIAGNOSIS — M4726 Other spondylosis with radiculopathy, lumbar region: Secondary | ICD-10-CM | POA: Diagnosis not present

## 2019-01-08 DIAGNOSIS — M4156 Other secondary scoliosis, lumbar region: Secondary | ICD-10-CM | POA: Diagnosis not present

## 2019-01-08 DIAGNOSIS — M5431 Sciatica, right side: Secondary | ICD-10-CM | POA: Diagnosis not present

## 2019-01-18 DIAGNOSIS — M5416 Radiculopathy, lumbar region: Secondary | ICD-10-CM | POA: Diagnosis not present

## 2019-01-18 DIAGNOSIS — M4156 Other secondary scoliosis, lumbar region: Secondary | ICD-10-CM | POA: Diagnosis not present

## 2019-01-18 DIAGNOSIS — Z6827 Body mass index (BMI) 27.0-27.9, adult: Secondary | ICD-10-CM | POA: Diagnosis not present

## 2019-02-08 DIAGNOSIS — E782 Mixed hyperlipidemia: Secondary | ICD-10-CM | POA: Diagnosis not present

## 2019-02-08 DIAGNOSIS — E038 Other specified hypothyroidism: Secondary | ICD-10-CM | POA: Diagnosis not present

## 2019-02-11 DIAGNOSIS — M5416 Radiculopathy, lumbar region: Secondary | ICD-10-CM | POA: Diagnosis not present

## 2019-02-12 DIAGNOSIS — E782 Mixed hyperlipidemia: Secondary | ICD-10-CM | POA: Diagnosis not present

## 2019-02-12 DIAGNOSIS — E034 Atrophy of thyroid (acquired): Secondary | ICD-10-CM | POA: Diagnosis not present

## 2019-02-12 DIAGNOSIS — I5032 Chronic diastolic (congestive) heart failure: Secondary | ICD-10-CM | POA: Diagnosis not present

## 2019-02-12 DIAGNOSIS — Z6828 Body mass index (BMI) 28.0-28.9, adult: Secondary | ICD-10-CM | POA: Diagnosis not present

## 2019-03-05 DIAGNOSIS — M4156 Other secondary scoliosis, lumbar region: Secondary | ICD-10-CM | POA: Diagnosis not present

## 2019-03-05 DIAGNOSIS — M419 Scoliosis, unspecified: Secondary | ICD-10-CM | POA: Diagnosis not present

## 2019-03-05 DIAGNOSIS — M4726 Other spondylosis with radiculopathy, lumbar region: Secondary | ICD-10-CM | POA: Diagnosis not present

## 2019-03-05 DIAGNOSIS — M5416 Radiculopathy, lumbar region: Secondary | ICD-10-CM | POA: Diagnosis not present

## 2019-03-05 DIAGNOSIS — M5431 Sciatica, right side: Secondary | ICD-10-CM | POA: Diagnosis not present

## 2019-03-14 DIAGNOSIS — Z4502 Encounter for adjustment and management of automatic implantable cardiac defibrillator: Secondary | ICD-10-CM | POA: Diagnosis not present

## 2019-04-01 ENCOUNTER — Encounter: Payer: Self-pay | Admitting: Gastroenterology

## 2019-04-01 ENCOUNTER — Other Ambulatory Visit: Payer: Self-pay | Admitting: Family Medicine

## 2019-04-01 DIAGNOSIS — L570 Actinic keratosis: Secondary | ICD-10-CM | POA: Diagnosis not present

## 2019-04-01 DIAGNOSIS — L821 Other seborrheic keratosis: Secondary | ICD-10-CM | POA: Diagnosis not present

## 2019-04-01 LAB — CBC AND DIFFERENTIAL
Baso # K/uL: 0.03 10*3/uL (ref 0.01–0.08)
Basophil %: 0.6 % (ref 0.1–1.2)
Eos # K/uL: 0.09 10*3/uL (ref 0.04–0.36)
Eosinophil %: 1.7 % (ref 0.7–5.8)
Hematocrit: 49.9 % — ABNORMAL HIGH (ref 34.1–44.9)
Hemoglobin: 16 g/dL — ABNORMAL HIGH (ref 11.2–15.7)
IMM Granulocytes #: 0.2 %
IMM Granulocytes: 0.01 10*3/uL — ABNORMAL HIGH (ref 0.0–0.0)
Lymph # K/uL: 2.21 10*3/uL (ref 1.18–3.74)
Lymphocyte %: 41.5 % (ref 19.3–51.7)
MCH: 29.6 pg (ref 25.6–32.2)
MCHC: 32.1 g/dl — ABNORMAL LOW (ref 32.2–35.5)
MCV: 92.2 fl (ref 79.4–94.8)
Mean Platelet Volume: 9.7 fl (ref 8.0–12.0)
Mono # K/uL: 0.57 10*3/uL (ref 0.20–0.90)
Monocyte %: 10.7 % (ref 4.7–12.5)
Neut # K/uL: 2.42 10*3/uL (ref 1.56–6.13)
Nucl RBC # K/uL: 0 10*3/uL — ABNORMAL LOW (ref 0.00–0.00)
Nucl RBC %: 0 — ABNORMAL LOW (ref 0.0–0.2)
Platelets: 223 10*3/uL (ref 160–370)
RBC Distribution Width-SD: 50.8 fl — ABNORMAL HIGH (ref 36.4–46.3)
RBC: 5.41 10*6/uL — ABNORMAL HIGH (ref 3.93–5.22)
RDW: 15 % — ABNORMAL HIGH (ref 11.7–14.4)
Seg Neut %: 45.3 % (ref 34.0–71.1)
WBC: 5.33 10*3/uL (ref 3.98–10.04)

## 2019-04-01 LAB — COMPREHENSIVE METABOLIC PANEL
ALT: 15 U/L (ref 0–35)
AST: 31 U/L (ref 0–35)
Albumin: 4 g/dL (ref 3.5–5.2)
Alk Phos: 63 U/L (ref 35–105)
Anion Gap: 10 mmol/L (ref 6–15)
Bilirubin,Total: 0.3 mg/dL (ref 0.0–1.2)
CO2: 26 mmol/L (ref 20–28)
Calcium: 9.2 mg/dL (ref 8.6–10.2)
Chloride: 103 mmol/L (ref 96–108)
Creatinine: 0.76 mg/dL (ref 0.51–0.95)
GFR,Black: 90.52 (ref >60)
GFR,Caucasian: 74.81 (ref >60)
Glucose: 90 mg/dL (ref 60–99)
Lab: 8.5 mg/dL (ref 6.0–20.0)
Potassium: 4.3 mmol/L (ref 3.3–5.1)
Sodium: 139 mmol/L (ref 133–145)
Total Protein: 7.8 g/dL — ABNORMAL HIGH (ref 6.3–7.7)

## 2019-04-01 LAB — MAGNESIUM: Magnesium: 1.9 mg/dL (ref 1.6–2.5)

## 2019-04-01 LAB — LIPID PANEL
Chol/HDL Ratio: 2.63
Cholesterol: 192 mg/dL (ref 0–200)
HDL: 73 mg/dL — ABNORMAL HIGH (ref 40–60)
LDL Calculated: 87 mg/dL
Triglycerides: 160 mg/dL — ABNORMAL HIGH (ref 0–150)

## 2019-04-02 LAB — VITAMIN D: 25-OH Vit Total: 32 ng/mL (ref 30–60)

## 2019-04-02 LAB — VITAMIN B12: Vitamin B12: 437 pg/mL (ref 232–1245)

## 2019-04-17 HISTORY — PX: LUMBAR FUSION: SHX111

## 2019-04-30 DIAGNOSIS — M4156 Other secondary scoliosis, lumbar region: Secondary | ICD-10-CM | POA: Diagnosis not present

## 2019-04-30 DIAGNOSIS — M5416 Radiculopathy, lumbar region: Secondary | ICD-10-CM | POA: Diagnosis not present

## 2019-04-30 DIAGNOSIS — M5431 Sciatica, right side: Secondary | ICD-10-CM | POA: Diagnosis not present

## 2019-04-30 DIAGNOSIS — M4726 Other spondylosis with radiculopathy, lumbar region: Secondary | ICD-10-CM | POA: Diagnosis not present

## 2019-05-01 DIAGNOSIS — Z1159 Encounter for screening for other viral diseases: Secondary | ICD-10-CM | POA: Diagnosis not present

## 2019-05-01 DIAGNOSIS — M4156 Other secondary scoliosis, lumbar region: Secondary | ICD-10-CM | POA: Diagnosis not present

## 2019-05-01 DIAGNOSIS — M4186 Other forms of scoliosis, lumbar region: Secondary | ICD-10-CM | POA: Diagnosis not present

## 2019-05-01 DIAGNOSIS — Z01812 Encounter for preprocedural laboratory examination: Secondary | ICD-10-CM | POA: Diagnosis not present

## 2019-05-01 DIAGNOSIS — M5116 Intervertebral disc disorders with radiculopathy, lumbar region: Secondary | ICD-10-CM | POA: Diagnosis not present

## 2019-05-02 DIAGNOSIS — Z01812 Encounter for preprocedural laboratory examination: Secondary | ICD-10-CM | POA: Diagnosis not present

## 2019-05-02 DIAGNOSIS — M5116 Intervertebral disc disorders with radiculopathy, lumbar region: Secondary | ICD-10-CM | POA: Diagnosis not present

## 2019-05-02 DIAGNOSIS — M4156 Other secondary scoliosis, lumbar region: Secondary | ICD-10-CM | POA: Diagnosis not present

## 2019-05-02 DIAGNOSIS — Z1159 Encounter for screening for other viral diseases: Secondary | ICD-10-CM | POA: Diagnosis not present

## 2019-05-02 DIAGNOSIS — M4186 Other forms of scoliosis, lumbar region: Secondary | ICD-10-CM | POA: Diagnosis not present

## 2019-05-03 DIAGNOSIS — Z01818 Encounter for other preprocedural examination: Secondary | ICD-10-CM | POA: Diagnosis not present

## 2019-05-06 DIAGNOSIS — Z95810 Presence of automatic (implantable) cardiac defibrillator: Secondary | ICD-10-CM | POA: Diagnosis not present

## 2019-05-06 DIAGNOSIS — I5032 Chronic diastolic (congestive) heart failure: Secondary | ICD-10-CM | POA: Diagnosis not present

## 2019-05-06 DIAGNOSIS — E782 Mixed hyperlipidemia: Secondary | ICD-10-CM | POA: Diagnosis not present

## 2019-05-06 DIAGNOSIS — Z01810 Encounter for preprocedural cardiovascular examination: Secondary | ICD-10-CM | POA: Diagnosis not present

## 2019-05-06 DIAGNOSIS — E034 Atrophy of thyroid (acquired): Secondary | ICD-10-CM | POA: Diagnosis not present

## 2019-05-08 DIAGNOSIS — Z981 Arthrodesis status: Secondary | ICD-10-CM | POA: Diagnosis not present

## 2019-05-08 DIAGNOSIS — Z950 Presence of cardiac pacemaker: Secondary | ICD-10-CM | POA: Diagnosis not present

## 2019-05-08 DIAGNOSIS — M4156 Other secondary scoliosis, lumbar region: Secondary | ICD-10-CM | POA: Diagnosis not present

## 2019-05-08 DIAGNOSIS — M419 Scoliosis, unspecified: Secondary | ICD-10-CM | POA: Insufficient documentation

## 2019-05-08 DIAGNOSIS — M4726 Other spondylosis with radiculopathy, lumbar region: Secondary | ICD-10-CM | POA: Diagnosis not present

## 2019-05-08 DIAGNOSIS — I498 Other specified cardiac arrhythmias: Secondary | ICD-10-CM | POA: Diagnosis not present

## 2019-05-08 DIAGNOSIS — Z90710 Acquired absence of both cervix and uterus: Secondary | ICD-10-CM | POA: Diagnosis not present

## 2019-05-08 DIAGNOSIS — M4186 Other forms of scoliosis, lumbar region: Secondary | ICD-10-CM | POA: Diagnosis not present

## 2019-05-08 DIAGNOSIS — I420 Dilated cardiomyopathy: Secondary | ICD-10-CM | POA: Diagnosis not present

## 2019-05-08 DIAGNOSIS — E871 Hypo-osmolality and hyponatremia: Secondary | ICD-10-CM | POA: Diagnosis not present

## 2019-05-08 DIAGNOSIS — D649 Anemia, unspecified: Secondary | ICD-10-CM | POA: Diagnosis not present

## 2019-05-08 DIAGNOSIS — I447 Left bundle-branch block, unspecified: Secondary | ICD-10-CM | POA: Diagnosis not present

## 2019-05-08 DIAGNOSIS — F329 Major depressive disorder, single episode, unspecified: Secondary | ICD-10-CM | POA: Diagnosis present

## 2019-05-08 DIAGNOSIS — Z4789 Encounter for other orthopedic aftercare: Secondary | ICD-10-CM | POA: Diagnosis not present

## 2019-05-08 DIAGNOSIS — M5440 Lumbago with sciatica, unspecified side: Secondary | ICD-10-CM | POA: Diagnosis not present

## 2019-05-08 DIAGNOSIS — G8918 Other acute postprocedural pain: Secondary | ICD-10-CM | POA: Diagnosis not present

## 2019-05-08 DIAGNOSIS — F419 Anxiety disorder, unspecified: Secondary | ICD-10-CM | POA: Diagnosis present

## 2019-05-08 DIAGNOSIS — M48062 Spinal stenosis, lumbar region with neurogenic claudication: Secondary | ICD-10-CM | POA: Diagnosis not present

## 2019-05-08 DIAGNOSIS — Z7409 Other reduced mobility: Secondary | ICD-10-CM | POA: Diagnosis not present

## 2019-05-08 DIAGNOSIS — I509 Heart failure, unspecified: Secondary | ICD-10-CM | POA: Diagnosis present

## 2019-05-08 DIAGNOSIS — E785 Hyperlipidemia, unspecified: Secondary | ICD-10-CM | POA: Diagnosis present

## 2019-05-08 DIAGNOSIS — M189 Osteoarthritis of first carpometacarpal joint, unspecified: Secondary | ICD-10-CM | POA: Diagnosis present

## 2019-05-08 DIAGNOSIS — E039 Hypothyroidism, unspecified: Secondary | ICD-10-CM | POA: Diagnosis present

## 2019-05-08 DIAGNOSIS — M5431 Sciatica, right side: Secondary | ICD-10-CM | POA: Diagnosis not present

## 2019-05-08 DIAGNOSIS — M5416 Radiculopathy, lumbar region: Secondary | ICD-10-CM | POA: Diagnosis not present

## 2019-05-08 DIAGNOSIS — M8580 Other specified disorders of bone density and structure, unspecified site: Secondary | ICD-10-CM | POA: Diagnosis present

## 2019-05-10 DIAGNOSIS — M8580 Other specified disorders of bone density and structure, unspecified site: Secondary | ICD-10-CM | POA: Diagnosis not present

## 2019-05-10 DIAGNOSIS — M419 Scoliosis, unspecified: Secondary | ICD-10-CM | POA: Diagnosis present

## 2019-05-10 DIAGNOSIS — Z7409 Other reduced mobility: Secondary | ICD-10-CM | POA: Diagnosis not present

## 2019-05-10 DIAGNOSIS — D649 Anemia, unspecified: Secondary | ICD-10-CM | POA: Diagnosis not present

## 2019-05-10 DIAGNOSIS — Z981 Arthrodesis status: Secondary | ICD-10-CM | POA: Diagnosis not present

## 2019-05-10 DIAGNOSIS — K5900 Constipation, unspecified: Secondary | ICD-10-CM | POA: Diagnosis present

## 2019-05-10 DIAGNOSIS — Z95810 Presence of automatic (implantable) cardiac defibrillator: Secondary | ICD-10-CM | POA: Diagnosis not present

## 2019-05-10 DIAGNOSIS — M4150 Other secondary scoliosis, site unspecified: Secondary | ICD-10-CM | POA: Diagnosis not present

## 2019-05-10 DIAGNOSIS — I420 Dilated cardiomyopathy: Secondary | ICD-10-CM | POA: Diagnosis not present

## 2019-05-10 DIAGNOSIS — Z4789 Encounter for other orthopedic aftercare: Secondary | ICD-10-CM | POA: Diagnosis not present

## 2019-05-10 DIAGNOSIS — G8918 Other acute postprocedural pain: Secondary | ICD-10-CM | POA: Diagnosis not present

## 2019-05-10 DIAGNOSIS — D62 Acute posthemorrhagic anemia: Secondary | ICD-10-CM | POA: Diagnosis not present

## 2019-05-10 DIAGNOSIS — M5416 Radiculopathy, lumbar region: Secondary | ICD-10-CM | POA: Diagnosis not present

## 2019-05-10 DIAGNOSIS — J90 Pleural effusion, not elsewhere classified: Secondary | ICD-10-CM | POA: Diagnosis not present

## 2019-05-10 DIAGNOSIS — Z789 Other specified health status: Secondary | ICD-10-CM | POA: Diagnosis not present

## 2019-05-10 DIAGNOSIS — E871 Hypo-osmolality and hyponatremia: Secondary | ICD-10-CM | POA: Diagnosis present

## 2019-05-10 DIAGNOSIS — E039 Hypothyroidism, unspecified: Secondary | ICD-10-CM | POA: Diagnosis present

## 2019-05-11 DIAGNOSIS — J90 Pleural effusion, not elsewhere classified: Secondary | ICD-10-CM | POA: Diagnosis not present

## 2019-05-13 DIAGNOSIS — E871 Hypo-osmolality and hyponatremia: Secondary | ICD-10-CM | POA: Insufficient documentation

## 2019-05-24 DIAGNOSIS — M2560 Stiffness of unspecified joint, not elsewhere classified: Secondary | ICD-10-CM | POA: Diagnosis not present

## 2019-05-24 DIAGNOSIS — M6281 Muscle weakness (generalized): Secondary | ICD-10-CM | POA: Diagnosis not present

## 2019-05-24 DIAGNOSIS — M545 Low back pain: Secondary | ICD-10-CM | POA: Diagnosis not present

## 2019-05-27 ENCOUNTER — Encounter: Payer: Self-pay | Admitting: Gastroenterology

## 2019-05-27 DIAGNOSIS — M6281 Muscle weakness (generalized): Secondary | ICD-10-CM | POA: Diagnosis not present

## 2019-05-27 DIAGNOSIS — M2560 Stiffness of unspecified joint, not elsewhere classified: Secondary | ICD-10-CM | POA: Diagnosis not present

## 2019-05-27 DIAGNOSIS — M545 Low back pain: Secondary | ICD-10-CM | POA: Diagnosis not present

## 2019-05-29 DIAGNOSIS — M6281 Muscle weakness (generalized): Secondary | ICD-10-CM | POA: Diagnosis not present

## 2019-05-29 DIAGNOSIS — M2560 Stiffness of unspecified joint, not elsewhere classified: Secondary | ICD-10-CM | POA: Diagnosis not present

## 2019-05-29 DIAGNOSIS — M545 Low back pain: Secondary | ICD-10-CM | POA: Diagnosis not present

## 2019-06-03 DIAGNOSIS — M2560 Stiffness of unspecified joint, not elsewhere classified: Secondary | ICD-10-CM | POA: Diagnosis not present

## 2019-06-03 DIAGNOSIS — M6281 Muscle weakness (generalized): Secondary | ICD-10-CM | POA: Diagnosis not present

## 2019-06-03 DIAGNOSIS — M545 Low back pain: Secondary | ICD-10-CM | POA: Diagnosis not present

## 2019-06-04 DIAGNOSIS — M48061 Spinal stenosis, lumbar region without neurogenic claudication: Secondary | ICD-10-CM | POA: Diagnosis not present

## 2019-06-04 DIAGNOSIS — H539 Unspecified visual disturbance: Secondary | ICD-10-CM | POA: Diagnosis not present

## 2019-06-04 DIAGNOSIS — G5601 Carpal tunnel syndrome, right upper limb: Secondary | ICD-10-CM | POA: Diagnosis not present

## 2019-06-05 DIAGNOSIS — M2560 Stiffness of unspecified joint, not elsewhere classified: Secondary | ICD-10-CM | POA: Diagnosis not present

## 2019-06-05 DIAGNOSIS — M545 Low back pain: Secondary | ICD-10-CM | POA: Diagnosis not present

## 2019-06-05 DIAGNOSIS — M6281 Muscle weakness (generalized): Secondary | ICD-10-CM | POA: Diagnosis not present

## 2019-06-06 DIAGNOSIS — M5416 Radiculopathy, lumbar region: Secondary | ICD-10-CM | POA: Diagnosis not present

## 2019-06-07 DIAGNOSIS — H25813 Combined forms of age-related cataract, bilateral: Secondary | ICD-10-CM | POA: Diagnosis not present

## 2019-06-07 NOTE — Progress Notes (Signed)
[facility]  Hematology Consult Service  Initial Consultation Note  [facility]  Name: Kylie Parker  MRN: [id number]  PCP: Francisco CapuchinHayward, Jean E, NP  Requesting physician: Ainsley SpinnerHayward, Jean NP   Date: 06/11/19    Reason for Consult: Assistance with evaluation of polycythemia     History of Present Illness:  [name] is a 73 y.o. woman with the past medical history of   Hypertension, CAD,  Hypercholesteremia, bipolar disorder, depression, Vit d deficiency, COPD, Osteoarthritis, fibromyalgia, chronic smoker for almost > 55 years who is referred for polycythemia.     [place] and lives alone with her many cats. She mentioned she noticed that she has these chronic headaches for the last 1 year which usually starts in the late morning and gets worse into evening. Sometimes he notes sparkling in her vision during these episodes.  She takes migraine pills and helps the pain. She also mentions regarding drenching night sweats very night where she has to change her clothes with associated weight loss of about 20 lbs over the last 6 to 7 months. She continues to smoke half pack a day and decreased from being a chain smoker most of her life. Denies any fever, chills, SOB, chest pain. She do not think she snores, but do not know. She got an overnight oximetry per pt 4 to 5 years ago and her doctor was not happy with the results. She doesn't like CPAP and not uses it.     She denies any blood clots that she knows of and do not remember taking any blood thinners except aspirin that she is on now. She is using her inhalers sparsely as needed.     Past Medical and Surgical History:  Past Medical History:   Diagnosis Date    ADHD (attention deficit hyperactivity disorder)     Angina     Arthritis     Bipolar 1 disorder     Chest pain, unspecified     pressure    COPD (chronic obstructive pulmonary disease)     Fibromyalgia     Hypertension     Shortness of breath        Allergies:  Allergies    Allergen Reactions    Seasonal Allergies Itching    Acid Blockers Support Rash       Medications:  Current Outpatient Medications:     metoprolol (TOPROL-XL) 25 MG 24 hr tablet, Take 25 mg by mouth daily   Do not crush or chew. May be divided., Disp: , Rfl:     lisinopril-hydrochlorothiazide (PRINZIDE,ZESTORETIC) 20-12.5 MG per tablet, Take 1 tablet by mouth every morning, Disp: , Rfl:     isosorbide mononitrate (IMDUR) 30 MG 24 hr tablet, Take 30 mg by mouth daily, Disp: , Rfl:     albuterol-ipratropium (COMBIVENT) 18-103 MCG/ACT inhaler, Inhale 2 puffs into the lungs daily   Shake well before each use., Disp: , Rfl:     methylphenidate (RITALIN) 10 MG tablet, Take 10 mg by mouth 2 times daily (before meals)   , Disp: , Rfl:     melatonin 3 MG, Take 3 mg by mouth nightly as needed, Disp: , Rfl:     Ascorbic Acid (VITAMIN C) 250 MG tablet, Take 250 mg by mouth daily., Disp: , Rfl:     oxycodone-acetaminophen (PERCOCET) 5-325 MG per tablet, Take 1 tablet by mouth every 4 hours as needed., Disp: , Rfl:     alprazolam (  XANAX) 0.5 MG tablet, Take 0.5 mg by mouth 2 times daily as needed., Disp: , Rfl:     ezetimibe (ZETIA) 10 MG tablet, Take 10 mg by mouth daily., Disp: , Rfl:     pregabalin (LYRICA) 75 MG capsule, Take 75 mg by mouth 2 times daily., Disp: , Rfl:     aspirin 81 MG EC tablet, Take 81 mg by mouth daily., Disp: , Rfl:     Calcium Carbonate-Vitamin D (CALCIUM + D) 600-200 MG-UNIT per tablet, Take 2 tablets by mouth daily., Disp: , Rfl:     Multiple Vitamin (MULTIVITAMIN) per tablet, Take 1 tablet by mouth daily., Disp: , Rfl:       Social History:  Social History     Socioeconomic History    Marital status: Divorced     Spouse name: Not on file    Number of children: Not on file    Years of education: Not on file    Highest education level: Not on file   Occupational History    Not on file   Tobacco Use    Smoking status: Current Every Day Smoker     Packs/day: 1.00     Years: 50.00      Pack years: 50.00    Smokeless tobacco: Never Used   Substance and Sexual Activity    Alcohol use: No    Drug use: No    Sexual activity: Not on file   Social History Narrative    Not on file       Family History:  Family History   Problem Relation Age of Onset    Heart Disease Mother     Heart Disease Father     Cancer Sister         Leukemia     Review of Systems: Review of Systems: Refer to HPI for pertinent positives and negatives. Remainder of detailed 12 point ROS (constitutional, eyes, ears/nose/mouth/throat, cardiovascular, respiratory, gastrointestinal, genitourinary, musculoskeletal, integumentary, neurologic, psychiatric, endocrine, hematologic, allergic/immunologic) is negative.     Physical Exam  BP 100/73    Pulse 82    Temp 36.4 C (97.5 F)    Wt 69.2 kg (152 lb 7.2 oz)    SpO2 95%    BMI 29.77 kg/m   Wt Readings from Last 3 Encounters:   06/10/19 69.2 kg (152 lb 7.2 oz)   01/21/13 79.8 kg (176 lb)   01/09/13 79.6 kg (175 lb 8 oz)       General appearance:  white female lying in NAD, appears stated age.   HENT: No head deformities, normal external appearance of ears. normal gross hearing. No mucositis/thrush, petechiae, gingival bleeding.    Eyes: No scleral icterus. EOMI, PERRL.    Lymph nodes: No enlarged cervical, axillary, inguinal or femoral lymph nodes.   Chest: Clear to ascultation bilaterally; no wheezes, crackles, rhonchi.   Cardiac: Regular rate and rhythm, no murmurs/rubs/gallops.   Abdominal: Active bowel sounds, soft, non-tender to palpation in all four quadrants, no palpable hepatomegaly or splenomegaly.   Musculoskeletal: No CVA tenderness, no tenderness over spine or other bones, no joint swelling, muscle atrophy.   Skin: No bruises or rashes.   Extremities: Warm to touch, palpable pulses, no lower extremity edema.   Neuro: Alert and oriented to person, place and time; normal comprehension and speech. grossly normal cranial nerves, strength and sensation; moves  all four extremities spontaneously.   Psych: Calm, pleasant affect.       Laboratory Results:  No results found for this or any previous visit (from the past 24 hour(s)).     Radiology:  No results found.    Pathology:  06/11/2019 Peripheral blood smear:   Erythrocytes: normocytic, normochromic, <1 schistocyte per HPF   Leukocytes: adequate neutrophils, some are dysplastic with bilobed nuclei.    Platelets: adequate platelets, minimal clumping noted    Impression:   [name] is a 73 y.o. woman with the past medical history of Hypertension, CAD,  Hypercholesteremia, bipolar disorder, depression, Vit d deficiency, COPD, Osteoarthritis, fibromyalgia, chronic smoker for almost > 55 years who is referred for polycythemia.     On review of her blood work, she is noted to have elevated HCT of 52 with hemoglobin of 16.3. Other wise other counts are unremarkable. Liver and Iron studies are normal as well as reticulocyte counts. Her carbon monoxide is significantly elevated at 15.5. Erythropoietin and JAK 2 is pending.   On review of the history, the erythrocytosis is likely secondary to her long standing smoking. It is unclear what is causing her weight loss and drenching night sweats. With mild increase in her HCT, we do not except these findings to be related to her elevated HCT. With ling standing smoking, we would recommend obtaining a CT chest for lung cancer screening. Other possibility is the thiazide diuretics which can concentrate the blood and cause elevated HCT.     Recommend:     - Await JAK 2 testing and erythropoietin levels.     - Recommend obtaining a CT chest lung cancer screening.     - Will decide on follow up depending on the test results.     Thank you for this consult.    The patient was seen and discussed with attending hematologist Dr. Rita OharaHuselton.   _______________________________________________  Ivan CroftManidhar Reddy Lieutenant Abarca, MBBS  Fellow in Hematology & Medical Oncology  [facility]  Division of Hematology/Oncology, Department of Medicine  [facility]

## 2019-06-10 ENCOUNTER — Ambulatory Visit: Payer: Medicare (Managed Care) | Attending: Hematology and Oncology | Admitting: Hematology and Oncology

## 2019-06-10 ENCOUNTER — Other Ambulatory Visit
Admission: RE | Admit: 2019-06-10 | Discharge: 2019-06-10 | Disposition: A | Discharge status: Home / Self Care | Payer: Medicare (Managed Care) | Source: Ambulatory Visit

## 2019-06-10 VITALS — BP 100/73 | HR 82 | Temp 97.5°F | Wt 152.4 lb

## 2019-06-10 DIAGNOSIS — D751 Secondary polycythemia: Secondary | ICD-10-CM

## 2019-06-10 DIAGNOSIS — F17200 Nicotine dependence, unspecified, uncomplicated: Secondary | ICD-10-CM | POA: Insufficient documentation

## 2019-06-10 DIAGNOSIS — M545 Low back pain: Secondary | ICD-10-CM | POA: Diagnosis not present

## 2019-06-10 DIAGNOSIS — M2560 Stiffness of unspecified joint, not elsewhere classified: Secondary | ICD-10-CM | POA: Diagnosis not present

## 2019-06-10 DIAGNOSIS — M6281 Muscle weakness (generalized): Secondary | ICD-10-CM | POA: Diagnosis not present

## 2019-06-10 LAB — SEDIMENTATION RATE, AUTOMATED: Sedimentation Rate: 25 mm/hr (ref 0–30)

## 2019-06-10 LAB — CBC AND DIFFERENTIAL
Baso # K/uL: 0.1 10*3/uL (ref 0.0–0.1)
Basophil %: 0.9 %
Eos # K/uL: 0.6 10*3/uL — ABNORMAL HIGH (ref 0.0–0.4)
Eosinophil %: 6.7 %
Hematocrit: 52 % — ABNORMAL HIGH (ref 34–45)
Hemoglobin: 16.3 g/dL — ABNORMAL HIGH (ref 11.2–15.7)
IMM Granulocytes #: 0 10*3/uL (ref 0.0–0.0)
IMM Granulocytes: 0.2 %
Lymph # K/uL: 2.7 10*3/uL (ref 1.2–3.7)
Lymphocyte %: 29.6 %
MCH: 30 pg/cell (ref 26–32)
MCHC: 32 g/dL (ref 32–36)
MCV: 94 fL (ref 79–95)
Mono # K/uL: 0.6 10*3/uL (ref 0.2–0.9)
Monocyte %: 7 %
Neut # K/uL: 5 10*3/uL (ref 1.6–6.1)
Nucl RBC # K/uL: 0 10*3/uL (ref 0.0–0.0)
Nucl RBC %: 0 /100 WBC (ref 0.0–0.2)
Platelets: 231 10*3/uL (ref 160–370)
RBC: 5.5 MIL/uL — ABNORMAL HIGH (ref 3.9–5.2)
RDW: 15 % — ABNORMAL HIGH (ref 11.7–14.4)
Seg Neut %: 55.6 %
WBC: 9 10*3/uL (ref 4.0–10.0)

## 2019-06-10 LAB — COMPREHENSIVE METABOLIC PANEL
ALT: 12 U/L (ref 0–35)
AST: 25 U/L (ref 0–35)
Albumin: 4.3 g/dL (ref 3.5–5.2)
Alk Phos: 65 U/L (ref 35–105)
Anion Gap: 12 (ref 7–16)
Bilirubin,Total: 0.3 mg/dL (ref 0.0–1.2)
CO2: 25 mmol/L (ref 20–28)
Calcium: 9.8 mg/dL (ref 8.6–10.2)
Chloride: 102 mmol/L (ref 96–108)
Creatinine: 0.79 mg/dL (ref 0.51–0.95)
GFR,Black: 86 *
GFR,Caucasian: 75 *
Glucose: 89 mg/dL (ref 60–99)
Lab: 11 mg/dL (ref 6–20)
Potassium: 4.6 mmol/L (ref 3.3–5.1)
Sodium: 139 mmol/L (ref 133–145)
Total Protein: 7.6 g/dL (ref 6.3–7.7)

## 2019-06-10 LAB — DUPLICATE SLIDE: Slide Sent To: 123

## 2019-06-10 LAB — TIBC
Iron: 70 ug/dL (ref 34–165)
TIBC: 402 ug/dL (ref 250–450)
Transferrin Saturation: 17 % (ref 15–50)

## 2019-06-10 LAB — FOLATE: Folate: >14 ng/mL (ref >=4.6)

## 2019-06-10 LAB — NEUTROPHIL #-INSTRUMENT: Neutrophil #-Instrument: 5 10*3/uL

## 2019-06-10 LAB — TRANSFERRIN: Transferrin: 298 mg/dL (ref 200–360)

## 2019-06-10 LAB — VITAMIN B12: Vitamin B12: 543 pg/mL (ref 232–1245)

## 2019-06-10 LAB — RETICULOCYTES
Retic %: 1.6 % (ref 0.7–2.1)
Retic Abs: 86.4 10*3/uL (ref 29.9–90.9)

## 2019-06-10 LAB — FERRITIN: Ferritin: 55 ng/mL (ref 10–120)

## 2019-06-10 LAB — CARBOXYHEMOGLOBIN: CO: 15.5 %

## 2019-06-12 DIAGNOSIS — M545 Low back pain: Secondary | ICD-10-CM | POA: Diagnosis not present

## 2019-06-12 DIAGNOSIS — M6281 Muscle weakness (generalized): Secondary | ICD-10-CM | POA: Diagnosis not present

## 2019-06-12 DIAGNOSIS — M2560 Stiffness of unspecified joint, not elsewhere classified: Secondary | ICD-10-CM | POA: Diagnosis not present

## 2019-06-12 LAB — ERYTHROPOIETIN: Erythropoietin: 13 mU/mL (ref 4–27)

## 2019-06-13 DIAGNOSIS — Z4502 Encounter for adjustment and management of automatic implantable cardiac defibrillator: Secondary | ICD-10-CM | POA: Diagnosis not present

## 2019-06-17 ENCOUNTER — Other Ambulatory Visit
Admission: RE | Admit: 2019-06-17 | Discharge: 2019-06-17 | Disposition: A | Discharge status: Home / Self Care | Payer: Medicare (Managed Care) | Source: Ambulatory Visit | Attending: Hematology and Oncology | Admitting: Hematology and Oncology

## 2019-06-17 DIAGNOSIS — D751 Secondary polycythemia: Secondary | ICD-10-CM | POA: Insufficient documentation

## 2019-06-17 DIAGNOSIS — M545 Low back pain: Secondary | ICD-10-CM | POA: Diagnosis not present

## 2019-06-17 DIAGNOSIS — M2560 Stiffness of unspecified joint, not elsewhere classified: Secondary | ICD-10-CM | POA: Diagnosis not present

## 2019-06-17 DIAGNOSIS — M6281 Muscle weakness (generalized): Secondary | ICD-10-CM | POA: Diagnosis not present

## 2019-06-17 LAB — JAK2 GENE: JAK2 V617F: NEGATIVE

## 2019-06-17 LAB — JAK2 REVIEW

## 2019-06-18 ENCOUNTER — Telehealth: Payer: Self-pay

## 2019-06-18 NOTE — Telephone Encounter (Signed)
Called Kylie Parker to discuss her lab results. Notified her that her Jak2 was negative, and Dr. Garnette Czech and Dr. Lona Kettle do not believe her polycythemia has a hematological cause. Told her it is most likely due to smoking. Offered smoking cessation resources, but states she has to think about this as smoking is "all she has". Told her to follow up with her primary about smoking cessation, as well as possible cancer screening due to her prolonged and continued smoking history. She states her dad lived until 7 while smoking, and believes she has his genes. Again reviewed risks and recommendations. She was appreciative of the call and is going to think about things.

## 2019-06-20 DIAGNOSIS — M6281 Muscle weakness (generalized): Secondary | ICD-10-CM | POA: Diagnosis not present

## 2019-06-20 DIAGNOSIS — M2560 Stiffness of unspecified joint, not elsewhere classified: Secondary | ICD-10-CM | POA: Diagnosis not present

## 2019-06-20 DIAGNOSIS — M545 Low back pain: Secondary | ICD-10-CM | POA: Diagnosis not present

## 2019-06-25 DIAGNOSIS — M6281 Muscle weakness (generalized): Secondary | ICD-10-CM | POA: Diagnosis not present

## 2019-06-25 DIAGNOSIS — M545 Low back pain: Secondary | ICD-10-CM | POA: Diagnosis not present

## 2019-06-25 DIAGNOSIS — M2560 Stiffness of unspecified joint, not elsewhere classified: Secondary | ICD-10-CM | POA: Diagnosis not present

## 2019-06-27 DIAGNOSIS — M6281 Muscle weakness (generalized): Secondary | ICD-10-CM | POA: Diagnosis not present

## 2019-06-27 DIAGNOSIS — M2560 Stiffness of unspecified joint, not elsewhere classified: Secondary | ICD-10-CM | POA: Diagnosis not present

## 2019-06-27 DIAGNOSIS — M545 Low back pain: Secondary | ICD-10-CM | POA: Diagnosis not present

## 2019-07-01 DIAGNOSIS — M6281 Muscle weakness (generalized): Secondary | ICD-10-CM | POA: Diagnosis not present

## 2019-07-01 DIAGNOSIS — M545 Low back pain: Secondary | ICD-10-CM | POA: Diagnosis not present

## 2019-07-01 DIAGNOSIS — M2560 Stiffness of unspecified joint, not elsewhere classified: Secondary | ICD-10-CM | POA: Diagnosis not present

## 2019-07-03 DIAGNOSIS — M545 Low back pain: Secondary | ICD-10-CM | POA: Diagnosis not present

## 2019-07-03 DIAGNOSIS — M2560 Stiffness of unspecified joint, not elsewhere classified: Secondary | ICD-10-CM | POA: Diagnosis not present

## 2019-07-03 DIAGNOSIS — M6281 Muscle weakness (generalized): Secondary | ICD-10-CM | POA: Diagnosis not present

## 2019-07-08 DIAGNOSIS — M545 Low back pain: Secondary | ICD-10-CM | POA: Diagnosis not present

## 2019-07-08 DIAGNOSIS — M2560 Stiffness of unspecified joint, not elsewhere classified: Secondary | ICD-10-CM | POA: Diagnosis not present

## 2019-07-08 DIAGNOSIS — M6281 Muscle weakness (generalized): Secondary | ICD-10-CM | POA: Diagnosis not present

## 2019-07-10 DIAGNOSIS — M545 Low back pain: Secondary | ICD-10-CM | POA: Diagnosis not present

## 2019-07-10 DIAGNOSIS — M6281 Muscle weakness (generalized): Secondary | ICD-10-CM | POA: Diagnosis not present

## 2019-07-10 DIAGNOSIS — M2560 Stiffness of unspecified joint, not elsewhere classified: Secondary | ICD-10-CM | POA: Diagnosis not present

## 2019-07-17 DIAGNOSIS — M2560 Stiffness of unspecified joint, not elsewhere classified: Secondary | ICD-10-CM | POA: Diagnosis not present

## 2019-07-17 DIAGNOSIS — M6281 Muscle weakness (generalized): Secondary | ICD-10-CM | POA: Diagnosis not present

## 2019-07-17 DIAGNOSIS — M545 Low back pain: Secondary | ICD-10-CM | POA: Diagnosis not present

## 2019-07-24 DIAGNOSIS — M2560 Stiffness of unspecified joint, not elsewhere classified: Secondary | ICD-10-CM | POA: Diagnosis not present

## 2019-07-24 DIAGNOSIS — M545 Low back pain: Secondary | ICD-10-CM | POA: Diagnosis not present

## 2019-07-24 DIAGNOSIS — M6281 Muscle weakness (generalized): Secondary | ICD-10-CM | POA: Diagnosis not present

## 2019-07-26 DIAGNOSIS — Z23 Encounter for immunization: Secondary | ICD-10-CM | POA: Diagnosis not present

## 2019-08-02 DIAGNOSIS — M4726 Other spondylosis with radiculopathy, lumbar region: Secondary | ICD-10-CM | POA: Diagnosis not present

## 2019-08-02 DIAGNOSIS — M5431 Sciatica, right side: Secondary | ICD-10-CM | POA: Diagnosis not present

## 2019-08-02 DIAGNOSIS — M5416 Radiculopathy, lumbar region: Secondary | ICD-10-CM | POA: Diagnosis not present

## 2019-08-02 DIAGNOSIS — M4156 Other secondary scoliosis, lumbar region: Secondary | ICD-10-CM | POA: Diagnosis not present

## 2019-08-06 DIAGNOSIS — M545 Low back pain: Secondary | ICD-10-CM | POA: Diagnosis not present

## 2019-08-06 DIAGNOSIS — M2560 Stiffness of unspecified joint, not elsewhere classified: Secondary | ICD-10-CM | POA: Diagnosis not present

## 2019-08-06 DIAGNOSIS — M6281 Muscle weakness (generalized): Secondary | ICD-10-CM | POA: Diagnosis not present

## 2019-08-12 DIAGNOSIS — M48061 Spinal stenosis, lumbar region without neurogenic claudication: Secondary | ICD-10-CM | POA: Diagnosis not present

## 2019-08-12 DIAGNOSIS — E782 Mixed hyperlipidemia: Secondary | ICD-10-CM | POA: Diagnosis not present

## 2019-08-12 DIAGNOSIS — Z6828 Body mass index (BMI) 28.0-28.9, adult: Secondary | ICD-10-CM | POA: Diagnosis not present

## 2019-08-12 DIAGNOSIS — E034 Atrophy of thyroid (acquired): Secondary | ICD-10-CM | POA: Diagnosis not present

## 2019-08-12 DIAGNOSIS — E038 Other specified hypothyroidism: Secondary | ICD-10-CM | POA: Diagnosis not present

## 2019-08-12 DIAGNOSIS — I5032 Chronic diastolic (congestive) heart failure: Secondary | ICD-10-CM | POA: Diagnosis not present

## 2019-08-12 DIAGNOSIS — I119 Hypertensive heart disease without heart failure: Secondary | ICD-10-CM | POA: Diagnosis not present

## 2019-08-16 DIAGNOSIS — H2511 Age-related nuclear cataract, right eye: Secondary | ICD-10-CM | POA: Diagnosis not present

## 2019-08-16 DIAGNOSIS — H40033 Anatomical narrow angle, bilateral: Secondary | ICD-10-CM | POA: Diagnosis not present

## 2019-08-16 DIAGNOSIS — Z01818 Encounter for other preprocedural examination: Secondary | ICD-10-CM | POA: Diagnosis not present

## 2019-08-16 DIAGNOSIS — H2513 Age-related nuclear cataract, bilateral: Secondary | ICD-10-CM | POA: Diagnosis not present

## 2019-08-27 DIAGNOSIS — I509 Heart failure, unspecified: Secondary | ICD-10-CM | POA: Diagnosis not present

## 2019-08-27 DIAGNOSIS — H40039 Anatomical narrow angle, unspecified eye: Secondary | ICD-10-CM | POA: Diagnosis not present

## 2019-08-27 DIAGNOSIS — H18519 Endothelial corneal dystrophy, unspecified eye: Secondary | ICD-10-CM | POA: Diagnosis not present

## 2019-08-27 DIAGNOSIS — H2511 Age-related nuclear cataract, right eye: Secondary | ICD-10-CM | POA: Diagnosis not present

## 2019-08-27 DIAGNOSIS — Z79899 Other long term (current) drug therapy: Secondary | ICD-10-CM | POA: Diagnosis not present

## 2019-08-27 DIAGNOSIS — H259 Unspecified age-related cataract: Secondary | ICD-10-CM | POA: Diagnosis not present

## 2019-08-27 DIAGNOSIS — I110 Hypertensive heart disease with heart failure: Secondary | ICD-10-CM | POA: Diagnosis not present

## 2019-08-27 DIAGNOSIS — H3530 Unspecified macular degeneration: Secondary | ICD-10-CM | POA: Diagnosis not present

## 2019-08-27 DIAGNOSIS — Z950 Presence of cardiac pacemaker: Secondary | ICD-10-CM | POA: Diagnosis not present

## 2019-08-29 DIAGNOSIS — Z4502 Encounter for adjustment and management of automatic implantable cardiac defibrillator: Secondary | ICD-10-CM | POA: Diagnosis not present

## 2019-08-29 DIAGNOSIS — I472 Ventricular tachycardia: Secondary | ICD-10-CM | POA: Diagnosis not present

## 2019-09-01 DIAGNOSIS — J80 Acute respiratory distress syndrome: Secondary | ICD-10-CM | POA: Diagnosis not present

## 2019-09-01 DIAGNOSIS — I110 Hypertensive heart disease with heart failure: Secondary | ICD-10-CM | POA: Diagnosis not present

## 2019-09-01 DIAGNOSIS — Z4682 Encounter for fitting and adjustment of non-vascular catheter: Secondary | ICD-10-CM | POA: Diagnosis not present

## 2019-09-01 DIAGNOSIS — R4020 Unspecified coma: Secondary | ICD-10-CM | POA: Diagnosis not present

## 2019-09-01 DIAGNOSIS — I499 Cardiac arrhythmia, unspecified: Secondary | ICD-10-CM | POA: Diagnosis not present

## 2019-09-01 DIAGNOSIS — R069 Unspecified abnormalities of breathing: Secondary | ICD-10-CM | POA: Diagnosis not present

## 2019-09-01 DIAGNOSIS — K219 Gastro-esophageal reflux disease without esophagitis: Secondary | ICD-10-CM | POA: Diagnosis not present

## 2019-09-01 DIAGNOSIS — M1990 Unspecified osteoarthritis, unspecified site: Secondary | ICD-10-CM | POA: Diagnosis not present

## 2019-09-01 DIAGNOSIS — I249 Acute ischemic heart disease, unspecified: Secondary | ICD-10-CM | POA: Diagnosis not present

## 2019-09-01 DIAGNOSIS — R0602 Shortness of breath: Secondary | ICD-10-CM | POA: Diagnosis not present

## 2019-09-01 DIAGNOSIS — E7800 Pure hypercholesterolemia, unspecified: Secondary | ICD-10-CM | POA: Diagnosis not present

## 2019-09-01 DIAGNOSIS — Z03818 Encounter for observation for suspected exposure to other biological agents ruled out: Secondary | ICD-10-CM | POA: Diagnosis not present

## 2019-09-01 DIAGNOSIS — Z95810 Presence of automatic (implantable) cardiac defibrillator: Secondary | ICD-10-CM | POA: Diagnosis not present

## 2019-09-01 DIAGNOSIS — R404 Transient alteration of awareness: Secondary | ICD-10-CM | POA: Diagnosis not present

## 2019-09-01 DIAGNOSIS — I469 Cardiac arrest, cause unspecified: Secondary | ICD-10-CM | POA: Diagnosis not present

## 2019-09-01 DIAGNOSIS — I509 Heart failure, unspecified: Secondary | ICD-10-CM | POA: Diagnosis not present

## 2019-09-02 DIAGNOSIS — J9383 Other pneumothorax: Secondary | ICD-10-CM | POA: Diagnosis present

## 2019-09-02 DIAGNOSIS — R079 Chest pain, unspecified: Secondary | ICD-10-CM | POA: Diagnosis not present

## 2019-09-02 DIAGNOSIS — R6520 Severe sepsis without septic shock: Secondary | ICD-10-CM | POA: Diagnosis present

## 2019-09-02 DIAGNOSIS — R578 Other shock: Secondary | ICD-10-CM | POA: Diagnosis not present

## 2019-09-02 DIAGNOSIS — A419 Sepsis, unspecified organism: Secondary | ICD-10-CM | POA: Diagnosis present

## 2019-09-02 DIAGNOSIS — J9601 Acute respiratory failure with hypoxia: Secondary | ICD-10-CM | POA: Diagnosis present

## 2019-09-02 DIAGNOSIS — S270XXA Traumatic pneumothorax, initial encounter: Secondary | ICD-10-CM | POA: Diagnosis not present

## 2019-09-02 DIAGNOSIS — J810 Acute pulmonary edema: Secondary | ICD-10-CM | POA: Diagnosis not present

## 2019-09-02 DIAGNOSIS — I340 Nonrheumatic mitral (valve) insufficiency: Secondary | ICD-10-CM | POA: Diagnosis present

## 2019-09-02 DIAGNOSIS — I447 Left bundle-branch block, unspecified: Secondary | ICD-10-CM | POA: Diagnosis not present

## 2019-09-02 DIAGNOSIS — J939 Pneumothorax, unspecified: Secondary | ICD-10-CM | POA: Diagnosis not present

## 2019-09-02 DIAGNOSIS — I509 Heart failure, unspecified: Secondary | ICD-10-CM | POA: Diagnosis not present

## 2019-09-02 DIAGNOSIS — I10 Essential (primary) hypertension: Secondary | ICD-10-CM | POA: Diagnosis present

## 2019-09-02 DIAGNOSIS — S27329A Contusion of lung, unspecified, initial encounter: Secondary | ICD-10-CM | POA: Diagnosis not present

## 2019-09-02 DIAGNOSIS — R918 Other nonspecific abnormal finding of lung field: Secondary | ICD-10-CM | POA: Diagnosis not present

## 2019-09-02 DIAGNOSIS — R579 Shock, unspecified: Secondary | ICD-10-CM | POA: Diagnosis present

## 2019-09-02 DIAGNOSIS — I420 Dilated cardiomyopathy: Secondary | ICD-10-CM | POA: Diagnosis not present

## 2019-09-02 DIAGNOSIS — K219 Gastro-esophageal reflux disease without esophagitis: Secondary | ICD-10-CM | POA: Diagnosis not present

## 2019-09-02 DIAGNOSIS — Z20828 Contact with and (suspected) exposure to other viral communicable diseases: Secondary | ICD-10-CM | POA: Diagnosis present

## 2019-09-02 DIAGNOSIS — I110 Hypertensive heart disease with heart failure: Secondary | ICD-10-CM | POA: Diagnosis not present

## 2019-09-02 DIAGNOSIS — Z95810 Presence of automatic (implantable) cardiac defibrillator: Secondary | ICD-10-CM | POA: Diagnosis not present

## 2019-09-02 DIAGNOSIS — I428 Other cardiomyopathies: Secondary | ICD-10-CM | POA: Diagnosis not present

## 2019-09-02 DIAGNOSIS — Z791 Long term (current) use of non-steroidal anti-inflammatories (NSAID): Secondary | ICD-10-CM | POA: Diagnosis not present

## 2019-09-02 DIAGNOSIS — I469 Cardiac arrest, cause unspecified: Secondary | ICD-10-CM | POA: Diagnosis not present

## 2019-09-02 DIAGNOSIS — Z8674 Personal history of sudden cardiac arrest: Secondary | ICD-10-CM | POA: Diagnosis not present

## 2019-09-02 DIAGNOSIS — Z9911 Dependence on respirator [ventilator] status: Secondary | ICD-10-CM | POA: Diagnosis not present

## 2019-09-02 DIAGNOSIS — E7800 Pure hypercholesterolemia, unspecified: Secondary | ICD-10-CM | POA: Diagnosis not present

## 2019-09-02 DIAGNOSIS — Z4682 Encounter for fitting and adjustment of non-vascular catheter: Secondary | ICD-10-CM | POA: Diagnosis not present

## 2019-09-02 DIAGNOSIS — J189 Pneumonia, unspecified organism: Secondary | ICD-10-CM | POA: Diagnosis present

## 2019-09-02 DIAGNOSIS — E039 Hypothyroidism, unspecified: Secondary | ICD-10-CM | POA: Diagnosis present

## 2019-09-02 DIAGNOSIS — R404 Transient alteration of awareness: Secondary | ICD-10-CM | POA: Diagnosis not present

## 2019-09-02 DIAGNOSIS — Z79899 Other long term (current) drug therapy: Secondary | ICD-10-CM | POA: Diagnosis not present

## 2019-09-10 DIAGNOSIS — Z938 Other artificial opening status: Secondary | ICD-10-CM | POA: Insufficient documentation

## 2019-09-10 DIAGNOSIS — I10 Essential (primary) hypertension: Secondary | ICD-10-CM | POA: Insufficient documentation

## 2019-09-10 DIAGNOSIS — E039 Hypothyroidism, unspecified: Secondary | ICD-10-CM | POA: Insufficient documentation

## 2019-09-10 DIAGNOSIS — Z0000 Encounter for general adult medical examination without abnormal findings: Secondary | ICD-10-CM | POA: Insufficient documentation

## 2019-09-10 DIAGNOSIS — Z8674 Personal history of sudden cardiac arrest: Secondary | ICD-10-CM | POA: Insufficient documentation

## 2019-09-11 DIAGNOSIS — I420 Dilated cardiomyopathy: Secondary | ICD-10-CM | POA: Diagnosis not present

## 2019-09-11 DIAGNOSIS — H543 Unqualified visual loss, both eyes: Secondary | ICD-10-CM | POA: Diagnosis not present

## 2019-09-11 DIAGNOSIS — I6789 Other cerebrovascular disease: Secondary | ICD-10-CM | POA: Diagnosis not present

## 2019-09-11 DIAGNOSIS — H538 Other visual disturbances: Secondary | ICD-10-CM | POA: Diagnosis not present

## 2019-09-11 DIAGNOSIS — Z7409 Other reduced mobility: Secondary | ICD-10-CM | POA: Diagnosis not present

## 2019-09-11 DIAGNOSIS — R9089 Other abnormal findings on diagnostic imaging of central nervous system: Secondary | ICD-10-CM | POA: Diagnosis not present

## 2019-09-11 DIAGNOSIS — H547 Unspecified visual loss: Secondary | ICD-10-CM | POA: Diagnosis not present

## 2019-09-11 DIAGNOSIS — M2550 Pain in unspecified joint: Secondary | ICD-10-CM | POA: Diagnosis not present

## 2019-09-11 DIAGNOSIS — E039 Hypothyroidism, unspecified: Secondary | ICD-10-CM | POA: Diagnosis not present

## 2019-09-11 DIAGNOSIS — Z7401 Bed confinement status: Secondary | ICD-10-CM | POA: Diagnosis not present

## 2019-09-12 DIAGNOSIS — R269 Unspecified abnormalities of gait and mobility: Secondary | ICD-10-CM | POA: Diagnosis present

## 2019-09-12 DIAGNOSIS — Z7409 Other reduced mobility: Secondary | ICD-10-CM | POA: Diagnosis not present

## 2019-09-12 DIAGNOSIS — I10 Essential (primary) hypertension: Secondary | ICD-10-CM | POA: Diagnosis present

## 2019-09-12 DIAGNOSIS — I051 Rheumatic mitral insufficiency: Secondary | ICD-10-CM | POA: Diagnosis present

## 2019-09-12 DIAGNOSIS — Z90710 Acquired absence of both cervix and uterus: Secondary | ICD-10-CM | POA: Diagnosis not present

## 2019-09-12 DIAGNOSIS — Z7982 Long term (current) use of aspirin: Secondary | ICD-10-CM | POA: Diagnosis not present

## 2019-09-12 DIAGNOSIS — H543 Unqualified visual loss, both eyes: Secondary | ICD-10-CM | POA: Diagnosis present

## 2019-09-12 DIAGNOSIS — Z79899 Other long term (current) drug therapy: Secondary | ICD-10-CM | POA: Diagnosis not present

## 2019-09-12 DIAGNOSIS — I420 Dilated cardiomyopathy: Secondary | ICD-10-CM | POA: Diagnosis present

## 2019-09-12 DIAGNOSIS — Z981 Arthrodesis status: Secondary | ICD-10-CM | POA: Diagnosis not present

## 2019-09-12 DIAGNOSIS — Z20828 Contact with and (suspected) exposure to other viral communicable diseases: Secondary | ICD-10-CM | POA: Diagnosis present

## 2019-09-12 DIAGNOSIS — E039 Hypothyroidism, unspecified: Secondary | ICD-10-CM | POA: Diagnosis present

## 2019-09-12 DIAGNOSIS — Z95810 Presence of automatic (implantable) cardiac defibrillator: Secondary | ICD-10-CM | POA: Diagnosis not present

## 2019-09-12 DIAGNOSIS — H547 Unspecified visual loss: Secondary | ICD-10-CM | POA: Diagnosis not present

## 2019-09-12 DIAGNOSIS — I480 Paroxysmal atrial fibrillation: Secondary | ICD-10-CM | POA: Diagnosis not present

## 2019-09-13 DIAGNOSIS — Z8701 Personal history of pneumonia (recurrent): Secondary | ICD-10-CM | POA: Diagnosis not present

## 2019-09-13 DIAGNOSIS — I10 Essential (primary) hypertension: Secondary | ICD-10-CM | POA: Diagnosis not present

## 2019-09-13 DIAGNOSIS — I2510 Atherosclerotic heart disease of native coronary artery without angina pectoris: Secondary | ICD-10-CM | POA: Diagnosis not present

## 2019-09-13 DIAGNOSIS — Z79899 Other long term (current) drug therapy: Secondary | ICD-10-CM | POA: Diagnosis not present

## 2019-09-13 DIAGNOSIS — Z7982 Long term (current) use of aspirin: Secondary | ICD-10-CM | POA: Diagnosis not present

## 2019-09-13 DIAGNOSIS — I480 Paroxysmal atrial fibrillation: Secondary | ICD-10-CM | POA: Diagnosis not present

## 2019-09-13 DIAGNOSIS — I472 Ventricular tachycardia: Secondary | ICD-10-CM | POA: Diagnosis present

## 2019-09-13 DIAGNOSIS — I447 Left bundle-branch block, unspecified: Secondary | ICD-10-CM | POA: Diagnosis present

## 2019-09-13 DIAGNOSIS — I340 Nonrheumatic mitral (valve) insufficiency: Secondary | ICD-10-CM | POA: Diagnosis present

## 2019-09-13 DIAGNOSIS — I208 Other forms of angina pectoris: Secondary | ICD-10-CM | POA: Insufficient documentation

## 2019-09-13 DIAGNOSIS — I420 Dilated cardiomyopathy: Secondary | ICD-10-CM | POA: Diagnosis present

## 2019-09-13 DIAGNOSIS — I255 Ischemic cardiomyopathy: Secondary | ICD-10-CM | POA: Diagnosis not present

## 2019-09-13 DIAGNOSIS — Z7409 Other reduced mobility: Secondary | ICD-10-CM | POA: Diagnosis not present

## 2019-09-13 DIAGNOSIS — I498 Other specified cardiac arrhythmias: Secondary | ICD-10-CM | POA: Diagnosis not present

## 2019-09-13 DIAGNOSIS — J9601 Acute respiratory failure with hypoxia: Secondary | ICD-10-CM | POA: Diagnosis not present

## 2019-09-13 DIAGNOSIS — I160 Hypertensive urgency: Secondary | ICD-10-CM | POA: Diagnosis not present

## 2019-09-13 DIAGNOSIS — I454 Nonspecific intraventricular block: Secondary | ICD-10-CM | POA: Diagnosis not present

## 2019-09-13 DIAGNOSIS — I501 Left ventricular failure: Secondary | ICD-10-CM | POA: Diagnosis not present

## 2019-09-13 DIAGNOSIS — R0789 Other chest pain: Secondary | ICD-10-CM | POA: Diagnosis not present

## 2019-09-13 DIAGNOSIS — Z95810 Presence of automatic (implantable) cardiac defibrillator: Secondary | ICD-10-CM | POA: Diagnosis not present

## 2019-09-13 DIAGNOSIS — M9689 Other intraoperative and postprocedural complications and disorders of the musculoskeletal system: Secondary | ICD-10-CM | POA: Diagnosis present

## 2019-09-13 DIAGNOSIS — R079 Chest pain, unspecified: Secondary | ICD-10-CM | POA: Diagnosis not present

## 2019-09-13 DIAGNOSIS — Z90710 Acquired absence of both cervix and uterus: Secondary | ICD-10-CM | POA: Diagnosis not present

## 2019-09-13 DIAGNOSIS — I110 Hypertensive heart disease with heart failure: Secondary | ICD-10-CM | POA: Diagnosis present

## 2019-09-13 DIAGNOSIS — I509 Heart failure, unspecified: Secondary | ICD-10-CM | POA: Diagnosis present

## 2019-09-13 DIAGNOSIS — I25118 Atherosclerotic heart disease of native coronary artery with other forms of angina pectoris: Secondary | ICD-10-CM | POA: Diagnosis present

## 2019-09-13 DIAGNOSIS — E039 Hypothyroidism, unspecified: Secondary | ICD-10-CM | POA: Diagnosis present

## 2019-09-13 DIAGNOSIS — H543 Unqualified visual loss, both eyes: Secondary | ICD-10-CM | POA: Diagnosis not present

## 2019-09-13 DIAGNOSIS — I493 Ventricular premature depolarization: Secondary | ICD-10-CM | POA: Diagnosis not present

## 2019-09-13 DIAGNOSIS — I459 Conduction disorder, unspecified: Secondary | ICD-10-CM | POA: Diagnosis not present

## 2019-09-18 DIAGNOSIS — N3000 Acute cystitis without hematuria: Secondary | ICD-10-CM | POA: Diagnosis not present

## 2019-09-19 DIAGNOSIS — I119 Hypertensive heart disease without heart failure: Secondary | ICD-10-CM | POA: Diagnosis not present

## 2019-09-19 DIAGNOSIS — I2510 Atherosclerotic heart disease of native coronary artery without angina pectoris: Secondary | ICD-10-CM | POA: Diagnosis not present

## 2019-09-19 DIAGNOSIS — N390 Urinary tract infection, site not specified: Secondary | ICD-10-CM | POA: Diagnosis not present

## 2019-09-19 DIAGNOSIS — Z7982 Long term (current) use of aspirin: Secondary | ICD-10-CM | POA: Diagnosis not present

## 2019-09-19 DIAGNOSIS — Z8674 Personal history of sudden cardiac arrest: Secondary | ICD-10-CM | POA: Diagnosis not present

## 2019-09-19 DIAGNOSIS — Z9181 History of falling: Secondary | ICD-10-CM | POA: Diagnosis not present

## 2019-09-19 DIAGNOSIS — I420 Dilated cardiomyopathy: Secondary | ICD-10-CM | POA: Diagnosis not present

## 2019-09-19 DIAGNOSIS — M419 Scoliosis, unspecified: Secondary | ICD-10-CM | POA: Diagnosis not present

## 2019-09-19 DIAGNOSIS — Z95810 Presence of automatic (implantable) cardiac defibrillator: Secondary | ICD-10-CM | POA: Diagnosis not present

## 2019-09-19 DIAGNOSIS — E039 Hypothyroidism, unspecified: Secondary | ICD-10-CM | POA: Diagnosis not present

## 2019-09-19 DIAGNOSIS — I340 Nonrheumatic mitral (valve) insufficiency: Secondary | ICD-10-CM | POA: Diagnosis not present

## 2019-09-20 DIAGNOSIS — I119 Hypertensive heart disease without heart failure: Secondary | ICD-10-CM | POA: Diagnosis not present

## 2019-09-20 DIAGNOSIS — I420 Dilated cardiomyopathy: Secondary | ICD-10-CM | POA: Diagnosis not present

## 2019-09-20 DIAGNOSIS — E039 Hypothyroidism, unspecified: Secondary | ICD-10-CM | POA: Diagnosis not present

## 2019-09-20 DIAGNOSIS — N390 Urinary tract infection, site not specified: Secondary | ICD-10-CM | POA: Diagnosis not present

## 2019-09-20 DIAGNOSIS — I340 Nonrheumatic mitral (valve) insufficiency: Secondary | ICD-10-CM | POA: Diagnosis not present

## 2019-09-20 DIAGNOSIS — I2510 Atherosclerotic heart disease of native coronary artery without angina pectoris: Secondary | ICD-10-CM | POA: Diagnosis not present

## 2019-09-23 DIAGNOSIS — H353132 Nonexudative age-related macular degeneration, bilateral, intermediate dry stage: Secondary | ICD-10-CM | POA: Diagnosis not present

## 2019-09-24 DIAGNOSIS — I119 Hypertensive heart disease without heart failure: Secondary | ICD-10-CM | POA: Diagnosis not present

## 2019-09-24 DIAGNOSIS — N390 Urinary tract infection, site not specified: Secondary | ICD-10-CM | POA: Diagnosis not present

## 2019-09-24 DIAGNOSIS — I340 Nonrheumatic mitral (valve) insufficiency: Secondary | ICD-10-CM | POA: Diagnosis not present

## 2019-09-24 DIAGNOSIS — I420 Dilated cardiomyopathy: Secondary | ICD-10-CM | POA: Diagnosis not present

## 2019-09-24 DIAGNOSIS — I2510 Atherosclerotic heart disease of native coronary artery without angina pectoris: Secondary | ICD-10-CM | POA: Diagnosis not present

## 2019-09-24 DIAGNOSIS — E039 Hypothyroidism, unspecified: Secondary | ICD-10-CM | POA: Diagnosis not present

## 2019-09-25 DIAGNOSIS — I469 Cardiac arrest, cause unspecified: Secondary | ICD-10-CM | POA: Diagnosis not present

## 2019-09-26 DIAGNOSIS — I420 Dilated cardiomyopathy: Secondary | ICD-10-CM | POA: Diagnosis not present

## 2019-09-26 DIAGNOSIS — E039 Hypothyroidism, unspecified: Secondary | ICD-10-CM | POA: Diagnosis not present

## 2019-09-26 DIAGNOSIS — I340 Nonrheumatic mitral (valve) insufficiency: Secondary | ICD-10-CM | POA: Diagnosis not present

## 2019-09-26 DIAGNOSIS — I119 Hypertensive heart disease without heart failure: Secondary | ICD-10-CM | POA: Diagnosis not present

## 2019-09-26 DIAGNOSIS — N390 Urinary tract infection, site not specified: Secondary | ICD-10-CM | POA: Diagnosis not present

## 2019-09-26 DIAGNOSIS — I2510 Atherosclerotic heart disease of native coronary artery without angina pectoris: Secondary | ICD-10-CM | POA: Diagnosis not present

## 2019-10-01 DIAGNOSIS — I420 Dilated cardiomyopathy: Secondary | ICD-10-CM | POA: Diagnosis not present

## 2019-10-01 DIAGNOSIS — E039 Hypothyroidism, unspecified: Secondary | ICD-10-CM | POA: Diagnosis not present

## 2019-10-01 DIAGNOSIS — N390 Urinary tract infection, site not specified: Secondary | ICD-10-CM | POA: Diagnosis not present

## 2019-10-01 DIAGNOSIS — I2510 Atherosclerotic heart disease of native coronary artery without angina pectoris: Secondary | ICD-10-CM | POA: Diagnosis not present

## 2019-10-01 DIAGNOSIS — I340 Nonrheumatic mitral (valve) insufficiency: Secondary | ICD-10-CM | POA: Diagnosis not present

## 2019-10-01 DIAGNOSIS — I119 Hypertensive heart disease without heart failure: Secondary | ICD-10-CM | POA: Diagnosis not present

## 2019-10-02 DIAGNOSIS — I340 Nonrheumatic mitral (valve) insufficiency: Secondary | ICD-10-CM | POA: Diagnosis not present

## 2019-10-02 DIAGNOSIS — I420 Dilated cardiomyopathy: Secondary | ICD-10-CM | POA: Diagnosis not present

## 2019-10-02 DIAGNOSIS — N390 Urinary tract infection, site not specified: Secondary | ICD-10-CM | POA: Diagnosis not present

## 2019-10-02 DIAGNOSIS — I119 Hypertensive heart disease without heart failure: Secondary | ICD-10-CM | POA: Diagnosis not present

## 2019-10-02 DIAGNOSIS — E039 Hypothyroidism, unspecified: Secondary | ICD-10-CM | POA: Diagnosis not present

## 2019-10-02 DIAGNOSIS — I2510 Atherosclerotic heart disease of native coronary artery without angina pectoris: Secondary | ICD-10-CM | POA: Diagnosis not present

## 2019-10-03 DIAGNOSIS — E039 Hypothyroidism, unspecified: Secondary | ICD-10-CM | POA: Diagnosis not present

## 2019-10-03 DIAGNOSIS — I420 Dilated cardiomyopathy: Secondary | ICD-10-CM | POA: Diagnosis not present

## 2019-10-03 DIAGNOSIS — N390 Urinary tract infection, site not specified: Secondary | ICD-10-CM | POA: Diagnosis not present

## 2019-10-03 DIAGNOSIS — I119 Hypertensive heart disease without heart failure: Secondary | ICD-10-CM | POA: Diagnosis not present

## 2019-10-03 DIAGNOSIS — I340 Nonrheumatic mitral (valve) insufficiency: Secondary | ICD-10-CM | POA: Diagnosis not present

## 2019-10-03 DIAGNOSIS — I2510 Atherosclerotic heart disease of native coronary artery without angina pectoris: Secondary | ICD-10-CM | POA: Diagnosis not present

## 2019-10-06 DIAGNOSIS — I2510 Atherosclerotic heart disease of native coronary artery without angina pectoris: Secondary | ICD-10-CM | POA: Diagnosis not present

## 2019-10-06 DIAGNOSIS — I340 Nonrheumatic mitral (valve) insufficiency: Secondary | ICD-10-CM | POA: Diagnosis not present

## 2019-10-06 DIAGNOSIS — E039 Hypothyroidism, unspecified: Secondary | ICD-10-CM | POA: Diagnosis not present

## 2019-10-06 DIAGNOSIS — N390 Urinary tract infection, site not specified: Secondary | ICD-10-CM | POA: Diagnosis not present

## 2019-10-06 DIAGNOSIS — I420 Dilated cardiomyopathy: Secondary | ICD-10-CM | POA: Diagnosis not present

## 2019-10-06 DIAGNOSIS — I119 Hypertensive heart disease without heart failure: Secondary | ICD-10-CM | POA: Diagnosis not present

## 2019-10-08 DIAGNOSIS — I2510 Atherosclerotic heart disease of native coronary artery without angina pectoris: Secondary | ICD-10-CM | POA: Diagnosis not present

## 2019-10-08 DIAGNOSIS — I340 Nonrheumatic mitral (valve) insufficiency: Secondary | ICD-10-CM | POA: Diagnosis not present

## 2019-10-08 DIAGNOSIS — E039 Hypothyroidism, unspecified: Secondary | ICD-10-CM | POA: Diagnosis not present

## 2019-10-08 DIAGNOSIS — I119 Hypertensive heart disease without heart failure: Secondary | ICD-10-CM | POA: Diagnosis not present

## 2019-10-08 DIAGNOSIS — N390 Urinary tract infection, site not specified: Secondary | ICD-10-CM | POA: Diagnosis not present

## 2019-10-08 DIAGNOSIS — I420 Dilated cardiomyopathy: Secondary | ICD-10-CM | POA: Diagnosis not present

## 2019-10-15 DIAGNOSIS — N390 Urinary tract infection, site not specified: Secondary | ICD-10-CM | POA: Diagnosis not present

## 2019-10-15 DIAGNOSIS — I2510 Atherosclerotic heart disease of native coronary artery without angina pectoris: Secondary | ICD-10-CM | POA: Diagnosis not present

## 2019-10-15 DIAGNOSIS — I420 Dilated cardiomyopathy: Secondary | ICD-10-CM | POA: Diagnosis not present

## 2019-10-15 DIAGNOSIS — E039 Hypothyroidism, unspecified: Secondary | ICD-10-CM | POA: Diagnosis not present

## 2019-10-15 DIAGNOSIS — I119 Hypertensive heart disease without heart failure: Secondary | ICD-10-CM | POA: Diagnosis not present

## 2019-10-15 DIAGNOSIS — I340 Nonrheumatic mitral (valve) insufficiency: Secondary | ICD-10-CM | POA: Diagnosis not present

## 2019-10-16 DIAGNOSIS — I420 Dilated cardiomyopathy: Secondary | ICD-10-CM | POA: Diagnosis not present

## 2019-10-16 DIAGNOSIS — I119 Hypertensive heart disease without heart failure: Secondary | ICD-10-CM | POA: Diagnosis not present

## 2019-10-16 DIAGNOSIS — I340 Nonrheumatic mitral (valve) insufficiency: Secondary | ICD-10-CM | POA: Diagnosis not present

## 2019-10-16 DIAGNOSIS — E039 Hypothyroidism, unspecified: Secondary | ICD-10-CM | POA: Diagnosis not present

## 2019-10-16 DIAGNOSIS — I2510 Atherosclerotic heart disease of native coronary artery without angina pectoris: Secondary | ICD-10-CM | POA: Diagnosis not present

## 2019-10-16 DIAGNOSIS — N390 Urinary tract infection, site not specified: Secondary | ICD-10-CM | POA: Diagnosis not present

## 2019-10-19 DIAGNOSIS — Z9181 History of falling: Secondary | ICD-10-CM | POA: Diagnosis not present

## 2019-10-19 DIAGNOSIS — I420 Dilated cardiomyopathy: Secondary | ICD-10-CM | POA: Diagnosis not present

## 2019-10-19 DIAGNOSIS — Z95810 Presence of automatic (implantable) cardiac defibrillator: Secondary | ICD-10-CM | POA: Diagnosis not present

## 2019-10-19 DIAGNOSIS — E039 Hypothyroidism, unspecified: Secondary | ICD-10-CM | POA: Diagnosis not present

## 2019-10-19 DIAGNOSIS — I119 Hypertensive heart disease without heart failure: Secondary | ICD-10-CM | POA: Diagnosis not present

## 2019-10-19 DIAGNOSIS — M419 Scoliosis, unspecified: Secondary | ICD-10-CM | POA: Diagnosis not present

## 2019-10-19 DIAGNOSIS — Z7982 Long term (current) use of aspirin: Secondary | ICD-10-CM | POA: Diagnosis not present

## 2019-10-19 DIAGNOSIS — Z8674 Personal history of sudden cardiac arrest: Secondary | ICD-10-CM | POA: Diagnosis not present

## 2019-10-19 DIAGNOSIS — I2510 Atherosclerotic heart disease of native coronary artery without angina pectoris: Secondary | ICD-10-CM | POA: Diagnosis not present

## 2019-10-19 DIAGNOSIS — N390 Urinary tract infection, site not specified: Secondary | ICD-10-CM | POA: Diagnosis not present

## 2019-10-19 DIAGNOSIS — I340 Nonrheumatic mitral (valve) insufficiency: Secondary | ICD-10-CM | POA: Diagnosis not present

## 2019-10-22 DIAGNOSIS — I119 Hypertensive heart disease without heart failure: Secondary | ICD-10-CM | POA: Diagnosis not present

## 2019-10-22 DIAGNOSIS — N390 Urinary tract infection, site not specified: Secondary | ICD-10-CM | POA: Diagnosis not present

## 2019-10-22 DIAGNOSIS — I2510 Atherosclerotic heart disease of native coronary artery without angina pectoris: Secondary | ICD-10-CM | POA: Diagnosis not present

## 2019-10-22 DIAGNOSIS — I420 Dilated cardiomyopathy: Secondary | ICD-10-CM | POA: Diagnosis not present

## 2019-10-22 DIAGNOSIS — E039 Hypothyroidism, unspecified: Secondary | ICD-10-CM | POA: Diagnosis not present

## 2019-10-22 DIAGNOSIS — I340 Nonrheumatic mitral (valve) insufficiency: Secondary | ICD-10-CM | POA: Diagnosis not present

## 2019-10-23 DIAGNOSIS — I2510 Atherosclerotic heart disease of native coronary artery without angina pectoris: Secondary | ICD-10-CM | POA: Diagnosis not present

## 2019-10-23 DIAGNOSIS — I340 Nonrheumatic mitral (valve) insufficiency: Secondary | ICD-10-CM | POA: Diagnosis not present

## 2019-10-23 DIAGNOSIS — E039 Hypothyroidism, unspecified: Secondary | ICD-10-CM | POA: Diagnosis not present

## 2019-10-23 DIAGNOSIS — I119 Hypertensive heart disease without heart failure: Secondary | ICD-10-CM | POA: Diagnosis not present

## 2019-10-23 DIAGNOSIS — I420 Dilated cardiomyopathy: Secondary | ICD-10-CM | POA: Diagnosis not present

## 2019-10-23 DIAGNOSIS — N390 Urinary tract infection, site not specified: Secondary | ICD-10-CM | POA: Diagnosis not present

## 2019-10-25 DIAGNOSIS — I2510 Atherosclerotic heart disease of native coronary artery without angina pectoris: Secondary | ICD-10-CM | POA: Diagnosis not present

## 2019-10-25 DIAGNOSIS — I420 Dilated cardiomyopathy: Secondary | ICD-10-CM | POA: Diagnosis not present

## 2019-10-25 DIAGNOSIS — I119 Hypertensive heart disease without heart failure: Secondary | ICD-10-CM | POA: Diagnosis not present

## 2019-10-25 DIAGNOSIS — I340 Nonrheumatic mitral (valve) insufficiency: Secondary | ICD-10-CM | POA: Diagnosis not present

## 2019-10-25 DIAGNOSIS — E039 Hypothyroidism, unspecified: Secondary | ICD-10-CM | POA: Diagnosis not present

## 2019-10-25 DIAGNOSIS — N390 Urinary tract infection, site not specified: Secondary | ICD-10-CM | POA: Diagnosis not present

## 2019-10-26 ENCOUNTER — Encounter: Payer: Self-pay | Admitting: Family Medicine

## 2019-10-26 DIAGNOSIS — H539 Unspecified visual disturbance: Secondary | ICD-10-CM | POA: Insufficient documentation

## 2019-10-26 DIAGNOSIS — G931 Anoxic brain damage, not elsewhere classified: Secondary | ICD-10-CM

## 2019-10-26 DIAGNOSIS — I469 Cardiac arrest, cause unspecified: Secondary | ICD-10-CM | POA: Insufficient documentation

## 2019-10-28 ENCOUNTER — Other Ambulatory Visit: Payer: Self-pay | Admitting: *Deleted

## 2019-10-28 DIAGNOSIS — Z4502 Encounter for adjustment and management of automatic implantable cardiac defibrillator: Secondary | ICD-10-CM | POA: Diagnosis not present

## 2019-10-28 NOTE — Patient Outreach (Signed)
Per Patient Pearletha Forge member has had 7 ED visits/hospitalizations in the past 6 months. She primarily goes to [place] per Patient Pearletha Forge.   Member screened for potential [facility] Care Management needs as a benefit of [place] [employer].   Telephone call made to Mrs. Garguilo at [phone] to discuss [facility] needs. Mrs. Mathias declines [facility] follow up.   Marthenia Rolling, MSN-Ed, RN,BSN [place] Acute Care Coordinator [phone] [facility]) [phone]  (Toll free office)

## 2019-10-30 DIAGNOSIS — E039 Hypothyroidism, unspecified: Secondary | ICD-10-CM | POA: Diagnosis not present

## 2019-10-30 DIAGNOSIS — I119 Hypertensive heart disease without heart failure: Secondary | ICD-10-CM | POA: Diagnosis not present

## 2019-10-30 DIAGNOSIS — N390 Urinary tract infection, site not specified: Secondary | ICD-10-CM | POA: Diagnosis not present

## 2019-10-30 DIAGNOSIS — I340 Nonrheumatic mitral (valve) insufficiency: Secondary | ICD-10-CM | POA: Diagnosis not present

## 2019-10-30 DIAGNOSIS — I2510 Atherosclerotic heart disease of native coronary artery without angina pectoris: Secondary | ICD-10-CM | POA: Diagnosis not present

## 2019-10-30 DIAGNOSIS — I420 Dilated cardiomyopathy: Secondary | ICD-10-CM | POA: Diagnosis not present

## 2019-10-31 DIAGNOSIS — I340 Nonrheumatic mitral (valve) insufficiency: Secondary | ICD-10-CM | POA: Diagnosis not present

## 2019-10-31 DIAGNOSIS — N390 Urinary tract infection, site not specified: Secondary | ICD-10-CM | POA: Diagnosis not present

## 2019-10-31 DIAGNOSIS — E039 Hypothyroidism, unspecified: Secondary | ICD-10-CM | POA: Diagnosis not present

## 2019-10-31 DIAGNOSIS — I420 Dilated cardiomyopathy: Secondary | ICD-10-CM | POA: Diagnosis not present

## 2019-10-31 DIAGNOSIS — I2510 Atherosclerotic heart disease of native coronary artery without angina pectoris: Secondary | ICD-10-CM | POA: Diagnosis not present

## 2019-10-31 DIAGNOSIS — I119 Hypertensive heart disease without heart failure: Secondary | ICD-10-CM | POA: Diagnosis not present

## 2019-11-04 DIAGNOSIS — N390 Urinary tract infection, site not specified: Secondary | ICD-10-CM | POA: Diagnosis not present

## 2019-11-04 DIAGNOSIS — E039 Hypothyroidism, unspecified: Secondary | ICD-10-CM | POA: Diagnosis not present

## 2019-11-04 DIAGNOSIS — I2510 Atherosclerotic heart disease of native coronary artery without angina pectoris: Secondary | ICD-10-CM | POA: Diagnosis not present

## 2019-11-04 DIAGNOSIS — I340 Nonrheumatic mitral (valve) insufficiency: Secondary | ICD-10-CM | POA: Diagnosis not present

## 2019-11-04 DIAGNOSIS — I420 Dilated cardiomyopathy: Secondary | ICD-10-CM | POA: Diagnosis not present

## 2019-11-04 DIAGNOSIS — I119 Hypertensive heart disease without heart failure: Secondary | ICD-10-CM | POA: Diagnosis not present

## 2019-11-05 DIAGNOSIS — I119 Hypertensive heart disease without heart failure: Secondary | ICD-10-CM | POA: Diagnosis not present

## 2019-11-05 DIAGNOSIS — N390 Urinary tract infection, site not specified: Secondary | ICD-10-CM | POA: Diagnosis not present

## 2019-11-05 DIAGNOSIS — E039 Hypothyroidism, unspecified: Secondary | ICD-10-CM | POA: Diagnosis not present

## 2019-11-05 DIAGNOSIS — I340 Nonrheumatic mitral (valve) insufficiency: Secondary | ICD-10-CM | POA: Diagnosis not present

## 2019-11-05 DIAGNOSIS — I2510 Atherosclerotic heart disease of native coronary artery without angina pectoris: Secondary | ICD-10-CM | POA: Diagnosis not present

## 2019-11-05 DIAGNOSIS — I420 Dilated cardiomyopathy: Secondary | ICD-10-CM | POA: Diagnosis not present

## 2019-11-06 DIAGNOSIS — F319 Bipolar disorder, unspecified: Secondary | ICD-10-CM | POA: Insufficient documentation

## 2019-11-06 DIAGNOSIS — F411 Generalized anxiety disorder: Secondary | ICD-10-CM | POA: Diagnosis present

## 2019-11-06 DIAGNOSIS — I2510 Atherosclerotic heart disease of native coronary artery without angina pectoris: Secondary | ICD-10-CM | POA: Diagnosis present

## 2019-11-06 DIAGNOSIS — I340 Nonrheumatic mitral (valve) insufficiency: Secondary | ICD-10-CM | POA: Diagnosis not present

## 2019-11-06 DIAGNOSIS — I420 Dilated cardiomyopathy: Secondary | ICD-10-CM | POA: Diagnosis not present

## 2019-11-06 DIAGNOSIS — N390 Urinary tract infection, site not specified: Secondary | ICD-10-CM | POA: Diagnosis not present

## 2019-11-06 DIAGNOSIS — I119 Hypertensive heart disease without heart failure: Secondary | ICD-10-CM | POA: Diagnosis not present

## 2019-11-06 DIAGNOSIS — E039 Hypothyroidism, unspecified: Secondary | ICD-10-CM | POA: Diagnosis not present

## 2019-11-08 ENCOUNTER — Encounter: Payer: Self-pay | Admitting: Family Medicine

## 2019-11-13 DIAGNOSIS — I2510 Atherosclerotic heart disease of native coronary artery without angina pectoris: Secondary | ICD-10-CM | POA: Diagnosis not present

## 2019-11-13 DIAGNOSIS — I420 Dilated cardiomyopathy: Secondary | ICD-10-CM | POA: Diagnosis not present

## 2019-11-13 DIAGNOSIS — I340 Nonrheumatic mitral (valve) insufficiency: Secondary | ICD-10-CM | POA: Diagnosis not present

## 2019-11-13 DIAGNOSIS — I119 Hypertensive heart disease without heart failure: Secondary | ICD-10-CM | POA: Diagnosis not present

## 2019-11-13 DIAGNOSIS — E039 Hypothyroidism, unspecified: Secondary | ICD-10-CM | POA: Diagnosis not present

## 2019-11-13 DIAGNOSIS — N390 Urinary tract infection, site not specified: Secondary | ICD-10-CM | POA: Diagnosis not present

## 2019-11-19 ENCOUNTER — Other Ambulatory Visit: Payer: Self-pay

## 2019-11-19 ENCOUNTER — Encounter: Payer: Self-pay | Admitting: Family Medicine

## 2019-11-19 ENCOUNTER — Ambulatory Visit (INDEPENDENT_AMBULATORY_CARE_PROVIDER_SITE_OTHER): Payer: Medicare Other | Admitting: Family Medicine

## 2019-11-19 VITALS — BP 128/78 | HR 80 | Temp 98.2°F | Resp 16 | Ht 62.0 in | Wt 157.2 lb

## 2019-11-19 DIAGNOSIS — M8588 Other specified disorders of bone density and structure, other site: Secondary | ICD-10-CM | POA: Diagnosis not present

## 2019-11-19 DIAGNOSIS — I5022 Chronic systolic (congestive) heart failure: Secondary | ICD-10-CM

## 2019-11-19 DIAGNOSIS — E782 Mixed hyperlipidemia: Secondary | ICD-10-CM

## 2019-11-19 NOTE — Progress Notes (Signed)
Established Patient Office Visit  Subjective:  Patient ID: Debra Mann, female    DOB: 05/13/1946  Age: 74 y.o. MRN: [id number]  CC:  Chief Complaint  Patient presents with  . Hyperlipidemia  . Coronary Artery Disease  . Decreased Visual Acuity    HPI Debra Mann presents for Congestive heart failure, hyperlipidemia, and anoxic injuries suffered in 09/2019. Patient is improving. She is better able to care for herself. Eats healthy. Not exercising.  Vision loss has not improved.    Past Medical History:  Diagnosis Date  . Anoxic brain damage, not elsewhere classified ([place])   . Cardiac arrest, cause unspecified ([place])   . Hyperlipidemia   . Nonrheumatic mitral (valve) insufficiency   . Presence of automatic (implantable) cardiac defibrillator   . Spinal stenosis, lumbar region without neurogenic claudication   . Thyroid disease   . Unilateral primary osteoarthritis, left knee   . Unqualified visual loss of both eyes     Past Surgical History:  Procedure Laterality Date  . ABDOMINAL HYSTERECTOMY    . APPENDECTOMY  1991  . LUMBAR FUSION  04/2019  . PACEMAKER INSERTION  2004, 2013   2014 replaced  lead wires;with biventricular defibrillator: pericardial sac infusion  . REVISION TOTAL HIP ARTHROPLASTY Left 03/2011   blood transfusion 03/2011  . TONSILLECTOMY    . TOTAL VAGINAL HYSTERECTOMY  1991    Family History  Problem Relation Age of Onset  . Diabetes Mother   . Transient ischemic attack Father     Social History   Socioeconomic History  . Marital status: Married    Spouse name: Not on file  . Number of children: 1  . Years of education: Not on file  . Highest education level: Not on file  Occupational History  . Not on file  Tobacco Use  . Smoking status: Never Smoker  . Smokeless tobacco: Never Used  Substance and Sexual Activity  . Alcohol use: Never  . Drug use: Never  . Sexual activity: Not on file  Other Topics Concern  . Not on file  Social  History Narrative  . Not on file   Social Determinants of Health   Financial Resource Strain:   . Difficulty of Paying Living Expenses: Not on file  Food Insecurity:   . Worried About Charity fundraiser in the Last Year: Not on file  . Ran Out of Food in the Last Year: Not on file  Transportation Needs:   . Lack of Transportation (Medical): Not on file  . Lack of Transportation (Non-Medical): Not on file  Physical Activity:   . Days of Exercise per Week: Not on file  . Minutes of Exercise per Session: Not on file  Stress:   . Feeling of Stress : Not on file  Social Connections:   . Frequency of Communication with Friends and Family: Not on file  . Frequency of Social Gatherings with Friends and Family: Not on file  . Attends Religious Services: Not on file  . Active Member of Clubs or Organizations: Not on file  . Attends Archivist Meetings: Not on file  . Marital Status: Not on file  Intimate Partner Violence:   . Fear of Current or Ex-Partner: Not on file  . Emotionally Abused: Not on file  . Physically Abused: Not on file  . Sexually Abused: Not on file    Outpatient Medications Prior to Visit  Medication Sig Dispense Refill  . aspirin 81 MG EC  tablet Take by mouth.    . co-enzyme Q-10 30 MG capsule Take by mouth.    . losartan (COZAAR) 50 MG tablet Take by mouth.    . meloxicam (MOBIC) 15 MG tablet Take by mouth.    . Multiple Vitamin (MULTIVITAMIN) tablet Take 1 tablet by mouth daily.    . Multiple Vitamins-Minerals (MULTIVITAMIN WITH MINERALS) tablet     . acetaminophen (TYLENOL) 500 MG tablet Take 1,000 mg by mouth every 6 (six) hours as needed.    Marland Kitchen atorvastatin (LIPITOR) 80 MG tablet Take by mouth.    . Calcium Carbonate-Vitamin D (CALCIUM 500/D) 500-125 MG-UNIT TABS Take by mouth.    . famotidine (PEPCID) 20 MG tablet Take by mouth.    . furosemide (LASIX) 20 MG tablet Take 20 mg by mouth daily.    Marland Kitchen gabapentin (NEURONTIN) 300 MG capsule Take 300 mg  by mouth 2 (two) times daily.    . hydrALAZINE (APRESOLINE) 10 MG tablet Take by mouth.    . Levothyroxine Sodium (TIROSINT) 88 MCG CAPS Take by mouth daily before breakfast.    . Omega-3 1000 MG CAPS Take by mouth.    . ondansetron (ZOFRAN-ODT) 4 MG disintegrating tablet Take by mouth.    . prednisoLONE acetate (PRED FORTE) 1 % ophthalmic suspension Place 1 drop into the right eye daily.    . traMADol (ULTRAM) 50 MG tablet Take by mouth.    . zolpidem (AMBIEN) 5 MG tablet Take by mouth.    . carvedilol (COREG) 12.5 MG tablet Take 12.5 mg by mouth 2 (two) times daily with a meal.    . meloxicam (MOBIC) 15 MG tablet Take 15 mg by mouth daily.    . rosuvastatin (CRESTOR) 5 MG tablet Take 5 mg by mouth every 14 (fourteen) days.     No facility-administered medications prior to visit.    Allergies  Allergen Reactions  . Atorvastatin     ROS Review of Systems  Constitutional: Negative for chills, fatigue and fever.  HENT: Negative for congestion, ear pain and sore throat.   Respiratory: Negative for cough and shortness of breath.   Cardiovascular: Negative for leg swelling.  Gastrointestinal: Negative for abdominal pain, constipation, diarrhea, nausea and vomiting.  Genitourinary: Negative for dysuria and urgency.  Musculoskeletal: Negative for arthralgias and myalgias.  Neurological: Negative for dizziness, speech difficulty, numbness and headaches.  Psychiatric/Behavioral: Negative for dysphoric mood and sleep disturbance.      Objective:    Physical Exam  Constitutional: She is oriented to person, place, and time. She appears well-developed and well-nourished.  Cardiovascular: Normal rate, regular rhythm, normal heart sounds and normal pulses.  No murmur heard. Pulmonary/Chest: Effort normal and breath sounds normal. No respiratory distress. She has no wheezes. She has no rales.  Abdominal: Soft. Bowel sounds are normal. There is no abdominal tenderness.  Musculoskeletal:         General: No edema. Normal range of motion.  Neurological: She is alert and oriented to person, place, and time.  Psychiatric: She has a normal mood and affect.    BP 128/78 (BP Location: Right Arm, Patient Position: Sitting)   Pulse 80   Temp 98.2 F (36.8 C)   Resp 16   Ht 5\' 2"  (1.575 m)   Wt 157 lb 3.2 oz (71.3 kg)   BMI 28.75 kg/m  Wt Readings from Last 3 Encounters:  11/19/19 157 lb 3.2 oz (71.3 kg)  12/20/18 161 lb (73 kg)     Health Maintenance  Due  Topic Date Due  . Hepatitis C Screening  09[birth year]  . TETANUS/TDAP  11/28/1965  . MAMMOGRAM  11/28/1996  . COLONOSCOPY  11/28/1996  . DEXA SCAN  11/29/2011    There are no preventive care reminders to display for this patient.  No results found for: TSH Lab Results  Component Value Date   WBC 5.1 11/19/2019   HGB 13.3 11/19/2019   HCT 39.4 11/19/2019   MCV 89 11/19/2019   PLT 299 11/19/2019   Lab Results  Component Value Date   NA 138 11/19/2019   K 4.4 11/19/2019   CO2 23 11/19/2019   GLUCOSE 91 11/19/2019   BUN 11 11/19/2019   CREATININE 0.81 11/19/2019   BILITOT 0.4 11/19/2019   ALKPHOS 105 11/19/2019   AST 18 11/19/2019   ALT 14 11/19/2019   PROT 6.8 11/19/2019   ALBUMIN 4.2 11/19/2019   CALCIUM 9.7 11/19/2019   Lab Results  Component Value Date   CHOL 168 11/19/2019   Lab Results  Component Value Date   HDL 49 11/19/2019   Lab Results  Component Value Date   LDLCALC 95 11/19/2019   Lab Results  Component Value Date   TRIG 136 11/19/2019   Lab Results  Component Value Date   CHOLHDL 3.4 11/19/2019   No results found for: HGBA1C    Assessment & Plan:   Problem List Items Addressed This Visit      Cardiovascular and Mediastinum   Congestive heart failure, NYHA class 1, chronic, systolic (HCC)   Relevant Medications   aspirin 81 MG EC tablet   atorvastatin (LIPITOR) 80 MG tablet   hydrALAZINE (APRESOLINE) 10 MG tablet   losartan (COZAAR) 50 MG tablet   Other Relevant  Orders   CBC with Differential (Completed)     Musculoskeletal and Integument   Osteopenia of spine - Primary   Relevant Orders   Vitamin D, 25-hydroxy (Completed)     Other   Mixed hyperlipidemia   Relevant Medications   aspirin 81 MG EC tablet   atorvastatin (LIPITOR) 80 MG tablet   hydrALAZINE (APRESOLINE) 10 MG tablet   losartan (COZAAR) 50 MG tablet   Other Relevant Orders   Comprehensive metabolic panel (Completed)   Lipid Panel (Completed)      No orders of the defined types were placed in this encounter.   Follow-up: Return in about 3 months (around 02/16/2020).    [name], MD

## 2019-11-20 LAB — CBC WITH DIFFERENTIAL/PLATELET
Basophils Absolute: 0.1 10*3/uL (ref 0.0–0.2)
Basos: 1 %
EOS (ABSOLUTE): 0.2 10*3/uL (ref 0.0–0.4)
Eos: 4 %
Hematocrit: 39.4 % (ref 34.0–46.6)
Hemoglobin: 13.3 g/dL (ref 11.1–15.9)
Immature Grans (Abs): 0 10*3/uL (ref 0.0–0.1)
Immature Granulocytes: 0 %
Lymphocytes Absolute: 1.6 10*3/uL (ref 0.7–3.1)
Lymphs: 31 %
MCH: 29.9 pg (ref 26.6–33.0)
MCHC: 33.8 g/dL (ref 31.5–35.7)
MCV: 89 fL (ref 79–97)
Monocytes Absolute: 0.8 10*3/uL (ref 0.1–0.9)
Monocytes: 16 %
Neutrophils Absolute: 2.4 10*3/uL (ref 1.4–7.0)
Neutrophils: 48 %
Platelets: 299 10*3/uL (ref 150–450)
RBC: 4.45 x10E6/uL (ref 3.77–5.28)
RDW: 13 % (ref 11.7–15.4)
WBC: 5.1 10*3/uL (ref 3.4–10.8)

## 2019-11-20 LAB — COMPREHENSIVE METABOLIC PANEL
ALT: 14 IU/L (ref 0–32)
AST: 18 IU/L (ref 0–40)
Albumin/Globulin Ratio: 1.6 (ref 1.2–2.2)
Albumin: 4.2 g/dL (ref 3.7–4.7)
Alkaline Phosphatase: 105 IU/L (ref 39–117)
BUN/Creatinine Ratio: 14 (ref 12–28)
BUN: 11 mg/dL (ref 8–27)
Bilirubin Total: 0.4 mg/dL (ref 0.0–1.2)
CO2: 23 mmol/L (ref 20–29)
Calcium: 9.7 mg/dL (ref 8.7–10.3)
Chloride: 102 mmol/L (ref 96–106)
Creatinine, Ser: 0.81 mg/dL (ref 0.57–1.00)
GFR calc Af Amer: 84 mL/min/{1.73_m2} (ref >59)
GFR calc non Af Amer: 73 mL/min/{1.73_m2} (ref >59)
Globulin, Total: 2.6 g/dL (ref 1.5–4.5)
Glucose: 91 mg/dL (ref 65–99)
Potassium: 4.4 mmol/L (ref 3.5–5.2)
Sodium: 138 mmol/L (ref 134–144)
Total Protein: 6.8 g/dL (ref 6.0–8.5)

## 2019-11-20 LAB — LIPID PANEL
Chol/HDL Ratio: 3.4 ratio (ref 0.0–4.4)
Cholesterol, Total: 168 mg/dL (ref 100–199)
HDL: 49 mg/dL (ref >39)
LDL Chol Calc (NIH): 95 mg/dL (ref 0–99)
Triglycerides: 136 mg/dL (ref 0–149)
VLDL Cholesterol Cal: 24 mg/dL (ref 5–40)

## 2019-11-20 LAB — CARDIOVASCULAR RISK ASSESSMENT

## 2019-11-20 LAB — VITAMIN D 25 HYDROXY (VIT D DEFICIENCY, FRACTURES): Vit D, 25-Hydroxy: 31.6 ng/mL (ref 30.0–100.0)

## 2019-11-21 ENCOUNTER — Other Ambulatory Visit: Payer: Self-pay

## 2019-11-21 MED ORDER — ROSUVASTATIN CALCIUM 5 MG PO TABS: 5.0000 mg | ORAL_TABLET | Freq: Every day | ORAL | 0 refills | Status: DC

## 2019-11-21 MED ORDER — CARVEDILOL 25 MG PO TABS: ORAL_TABLET | ORAL | 0 refills | Status: DC

## 2019-11-21 NOTE — Telephone Encounter (Signed)
Per patient , she would like to try the Carvedilol 25mg  with 1/2 tablet daily instead of doing the 12.5mg  twice daily. She stated that she wanted to try and see if it would save her money. LA

## 2019-11-28 NOTE — Patient Instructions (Signed)
No changes to medicines.  Continue home exercises.  Continue to eat healthy.

## 2019-12-03 DIAGNOSIS — M5431 Sciatica, right side: Secondary | ICD-10-CM | POA: Diagnosis not present

## 2019-12-03 DIAGNOSIS — M4726 Other spondylosis with radiculopathy, lumbar region: Secondary | ICD-10-CM | POA: Diagnosis not present

## 2019-12-03 DIAGNOSIS — Z1231 Encounter for screening mammogram for malignant neoplasm of breast: Secondary | ICD-10-CM | POA: Diagnosis not present

## 2019-12-03 DIAGNOSIS — M4156 Other secondary scoliosis, lumbar region: Secondary | ICD-10-CM | POA: Diagnosis not present

## 2019-12-16 DIAGNOSIS — Z23 Encounter for immunization: Secondary | ICD-10-CM | POA: Diagnosis not present

## 2019-12-23 ENCOUNTER — Encounter: Payer: Self-pay | Admitting: Family Medicine

## 2019-12-24 ENCOUNTER — Other Ambulatory Visit: Payer: Self-pay

## 2019-12-24 MED ORDER — LEVOTHYROXINE SODIUM 88 MCG PO CAPS: 88.0000 ug | ORAL_CAPSULE | Freq: Every day | ORAL | 2 refills | Status: DC

## 2019-12-25 ENCOUNTER — Other Ambulatory Visit: Payer: Self-pay

## 2019-12-25 MED ORDER — LEVOTHYROXINE SODIUM 88 MCG PO CAPS: 88.0000 ug | ORAL_CAPSULE | Freq: Every day | ORAL | 2 refills | Status: DC

## 2020-01-02 ENCOUNTER — Other Ambulatory Visit: Payer: Self-pay

## 2020-01-02 MED ORDER — LEVOTHYROXINE SODIUM 88 MCG PO TABS: 88.0000 ug | ORAL_TABLET | Freq: Every day | ORAL | 2 refills | Status: DC

## 2020-01-02 MED ORDER — LEVOTHYROXINE SODIUM 88 MCG PO TABS: 88.0000 ug | ORAL_TABLET | Freq: Every day | ORAL | 3 refills | Status: DC

## 2020-01-13 DIAGNOSIS — Z23 Encounter for immunization: Secondary | ICD-10-CM | POA: Diagnosis not present

## 2020-01-23 ENCOUNTER — Telehealth: Payer: Self-pay | Admitting: Family Medicine

## 2020-01-23 NOTE — Progress Notes (Signed)
  Chronic Care Management   Note  01/23/2020 Name: CHEVY ZEGARRA MRN: [id number] DOB: [birth year]  PERSAYIS WEAD is a 74 y.o. year old female who is a primary care patient of [name], Kirsten, MD. I reached out to [name] by phone today in response to a referral sent by Ms. Carma Lair PCP, [name], Kirsten, MD.   Ms. Schwieger was given information about Chronic Care Management services today including:  1. CCM service includes personalized support from designated clinical staff supervised by her physician, including individualized plan of care and coordination with other care providers 2. 24/7 contact phone numbers for assistance for urgent and routine care needs. 3. Service will only be billed when office clinical staff spend 20 minutes or more in a month to coordinate care. 4. Only one practitioner may furnish and bill the service in a calendar month. 5. The patient may stop CCM services at any time (effective at the end of the month) by phone call to the office staff.   Patient agreed to services and verbal consent obtained.   Follow up plan:   Earney Hamburg Upstream Scheduler

## 2020-01-23 NOTE — Progress Notes (Signed)
  Chronic Care Management   Outreach Note  01/23/2020 Name: Debra Mann MRN: [id number] DOB: 10-28-1945  Referred by: [name], MD Reason for referral : No chief complaint on file.   An unsuccessful telephone outreach was attempted today. The patient was referred to the pharmacist for assistance with care management and care coordination.   Follow Up Plan:   Earney Hamburg Upstream Scheduler

## 2020-01-27 DIAGNOSIS — Z4502 Encounter for adjustment and management of automatic implantable cardiac defibrillator: Secondary | ICD-10-CM | POA: Diagnosis not present

## 2020-02-06 NOTE — Chronic Care Management (AMB) (Deleted)
Chronic Care Management Pharmacy  Name: Debra Mann  MRN: [id number] DOB: 01/09/1946  Chief Complaint/ HPI  Debra Mann,  74 y.o. , female presents for their Initial CCM visit with the clinical pharmacist via telephone due to COVID-19 Pandemic.  PCP : Debra Brome, MD  Their chronic conditions include: CHF NYHA Class 1 - systolic, Atrial flutter, HTN, Hypothyroidism, Osteopenia, HLD, Pain management.  Office Visits:  11/19/2019- Patient is improving. She is better able to care for herself. Eats healthy. Not exercising.  Vision loss has not improved. No medication changes. 10/03/2019 - medicare physical 09/25/2019 - Transition of care after cardiac arrest. Per Dr. Alyse Mann note - Patient lucky to be alive thanks to quick action of husband with home CPR. No new meds.  09/18/2019 - Acute cystitis prescribed bactrim DS. Patient s/p rehab with catheter.  08/12/2019 - increased Crestor to 10 mg due to elevated LDL. CBC/Kidney/Liver - normal.   Consult Visit: 09/10/2019 - Implantable cardioverter defibrillator.  09/01/2019-09/17/2019 - Hospital for cardiac arrest and rehab therafter.   Medications: Outpatient Encounter Medications as of 02/11/2020  Medication Sig  . acetaminophen (TYLENOL) 500 MG tablet Take 1,000 mg by mouth every 6 (six) hours as needed.  Marland Kitchen aspirin 81 MG EC tablet Take by mouth.  Marland Kitchen atorvastatin (LIPITOR) 80 MG tablet Take by mouth.  . Calcium Carbonate-Vitamin D (CALCIUM 500/D) 500-125 MG-UNIT TABS Take by mouth.  . carvedilol (COREG) 25 MG tablet Take 1/2 tablet daily.  Marland Kitchen co-enzyme Q-10 30 MG capsule Take by mouth.  . famotidine (PEPCID) 20 MG tablet Take by mouth.  . furosemide (LASIX) 20 MG tablet Take 20 mg by mouth daily.  Marland Kitchen gabapentin (NEURONTIN) 300 MG capsule Take 300 mg by mouth 2 (two) times daily.  . hydrALAZINE (APRESOLINE) 10 MG tablet Take by mouth.  . levothyroxine (SYNTHROID) 88 MCG tablet Take 1 tablet (88 mcg total) by mouth daily.  Marland Kitchen losartan  (COZAAR) 50 MG tablet Take by mouth.  . meloxicam (MOBIC) 15 MG tablet Take by mouth.  . Multiple Vitamin (MULTIVITAMIN) tablet Take 1 tablet by mouth daily.  . Multiple Vitamins-Minerals (MULTIVITAMIN WITH MINERALS) tablet   . Omega-3 1000 MG CAPS Take by mouth.  . ondansetron (ZOFRAN-ODT) 4 MG disintegrating tablet Take by mouth.  . prednisoLONE acetate (PRED FORTE) 1 % ophthalmic suspension Place 1 drop into the right eye daily.  . rosuvastatin (CRESTOR) 5 MG tablet Take 1 tablet (5 mg total) by mouth at bedtime.  . traMADol (ULTRAM) 50 MG tablet Take by mouth.  . zolpidem (AMBIEN) 5 MG tablet Take by mouth.   No facility-administered encounter medications on file as of 02/11/2020.   Allergies  Allergen Reactions  . Atorvastatin    SDOH Screenings   Alcohol Screen:   . Last Alcohol Screening Score (AUDIT):   Depression (PHQ2-9):   . PHQ-2 Score:   Financial Resource Strain:   . Difficulty of Paying Living Expenses:   Food Insecurity:   . Worried About Charity fundraiser in the Last Year:   . [place] in the Last Year:   Housing:   . Last Housing Risk Score:   Physical Activity:   . Days of Exercise per Week:   . Minutes of Exercise per Session:   Social Connections:   . Frequency of Communication with Friends and Family:   . Frequency of Social Gatherings with Friends and Family:   . Attends Religious Services:   . Active Member of Clubs  or Organizations:   . Attends Archivist Meetings:   Marland Kitchen Marital Status:   Stress:   . Feeling of Stress :   Tobacco Use: Mann Risk   . Smoking Tobacco Use: Never Smoker  . Smokeless Tobacco Use: Never Used  Transportation Needs:   . Film/video editor (Medical):   Marland Kitchen Lack of Transportation (Non-Medical):       Current Diagnosis/Assessment:  Goals Addressed   None     Heart Failure   Type: Systolic  Last ejection fraction: *** NYHA Class: I (no actitivty limitation) AHA HF Stage: {CHL HP Upstream  Pharm AHA HF [phone]}  BP today is:  {CHL HP UPSTREAM Pharmacist BP ranges:[phone]}  Office blood pressures are  BP Readings from Last 3 Encounters:  11/19/19 128/78  12/31/18 (!) 183/91    Patient checks BP at home {CHL HP BP Monitoring Frequency:[id number]}  Patient home BP readings are ranging: ***  Patient has failed these meds in past: *** Patient is currently {CHL Controlled/Uncontrolled:[phone]} on the following medications: carvedilol 25 mg 1/2 tab daily, furosemide 20 mg daily, hydralazine 10 mg ***, losartan 50 mg ***  We discussed {CHL HP Upstream Pharmacy discussion:[phone]}  Plan  Continue {CHL HP Upstream Pharmacy Plans:[phone]}  and   Hypothyroidism   No results found for: TSH   Patient has failed these meds in past: *** Patient is currently {CHL Controlled/Uncontrolled:[phone]} on the following medications: levothyroxine 88 mcg  We discussed:  {CHL HP Upstream Pharmacy discussion:[phone]}  Plan  Continue {CHL HP Upstream Pharmacy Plans:[phone]}   Hyperlipidemia   Lipid Panel     Component Value Date/Time   CHOL 168 11/19/2019 0832   TRIG 136 11/19/2019 0832   HDL 49 11/19/2019 0832   CHOLHDL 3.4 11/19/2019 0832   [place] 95 11/19/2019 0832   LABVLDL 24 11/19/2019 0832     The 10-year ASCVD risk score Mikey Bussing DC Jr., et al., 2013) is: 16.8%   Values used to calculate the score:     Age: 72 years     Sex: Female     Is Non-Hispanic African American: No     Diabetic: No     Tobacco smoker: No     Systolic Blood Pressure: 0000000 mmHg     Is BP treated: Yes     HDL Cholesterol: 49 mg/dL     Total Cholesterol: 168 mg/dL   Patient has failed these meds in past: atorvastatin  Patient is currently {CHL Controlled/Uncontrolled:[phone]} on the following medications: rosuvastatin 5 mg or atorvastatin 80 (Dr. Tobie Mann had once increased to 10 mg)*** qhs, COQ10 - 30 mg, omega-3 1000 mg ***  We discussed:  {CHL HP Upstream  Pharmacy discussion:[phone]}  Plan  Continue {CHL HP Upstream Pharmacy Plans:[phone]}   ***Pain   Patient has failed these meds in past: not reported Patient is currently {CHL Controlled/Uncontrolled:[phone]} on the following medications: tylenol 500 mg q6h prn, meloxicam 15 mg daily, tramadol 50 mg ***, gabapentin 300 mg bid  We discussed:  ***  Plan  Continue {CHL HP Upstream Pharmacy Plans:[phone]}  Osteopenia / Osteoporosis   Last DEXA Scan: ***   T-Score femoral neck: ***  T-Score total hip: ***  T-Score lumbar spine: ***  10-year probability of major osteoporotic fracture: ***  10-year probability of hip fracture: ***  Patient {is;is not an osteoporosis candidate:23886}  Patient has failed these meds in past: *** Patient is currently {CHL Controlled/Uncontrolled:[phone]} on the following medications: Calcium carbonate with D - 500-125 mg   We  discussed:  {Osteoporosis Counseling:23892}  Plan  Continue {CHL HP Upstream Pharmacy Plans:[phone]}   ***GERD?   Patient has failed these meds in past: *** Patient is currently {CHL Controlled/Uncontrolled:[id number]} on the following medications: famotidine 20 mg   We discussed:  ***  Plan  Continue {CHL HP Upstream Pharmacy Plans:[phone]}   Health Maintenance   Patient is currently {CHL Controlled/Uncontrolled:4500224607} on the following medications: *** Zolpidem 5 mg daily -  Ondansetron ODT 4 mg -  Prednisolone eye drops***-  Multivitamin -  We discussed:  ***  Plan  Continue {CHL HP Upstream Pharmacy PH:[id number]  Vaccines   Reviewed and discussed patient's vaccination history.  COVID vaccine***  Immunization History  Administered Date(s) Administered  . Influenza, High Dose Seasonal PF 06/06/2016, 07/03/2017  . Influenza, Seasonal, Injecte, Preservative Fre 11/18/2014  . Influenza-Unspecified 08/17/2013, 08/01/2018, 07/26/2019  . Pneumococcal Conjugate-13 03/17/2016    . Pneumococcal Polysaccharide-23 10/22/2012    Plan  Recommended patient receive *** vaccine in *** office/pharmacy.   Medication Management   Pt uses [facility] for all medications Uses pill box? {Yes or If no, why not?:20788} Pt endorses ***% compliance  We discussed: ***  Plan  {[facility] [place]:[id number]    Follow up: *** month phone visit

## 2020-02-10 ENCOUNTER — Other Ambulatory Visit: Payer: Self-pay

## 2020-02-10 MED ORDER — CARVEDILOL 12.5 MG PO TABS: 12.5000 mg | ORAL_TABLET | Freq: Two times a day (BID) | ORAL | 0 refills | Status: DC

## 2020-02-11 ENCOUNTER — Telehealth: Payer: Medicare Other

## 2020-02-11 ENCOUNTER — Other Ambulatory Visit: Payer: Self-pay

## 2020-02-11 DIAGNOSIS — I5022 Chronic systolic (congestive) heart failure: Secondary | ICD-10-CM

## 2020-02-11 MED ORDER — LOSARTAN POTASSIUM 50 MG PO TABS: 50.0000 mg | ORAL_TABLET | Freq: Every day | ORAL | 0 refills | Status: DC

## 2020-02-17 DIAGNOSIS — E785 Hyperlipidemia, unspecified: Secondary | ICD-10-CM | POA: Insufficient documentation

## 2020-02-17 DIAGNOSIS — E079 Disorder of thyroid, unspecified: Secondary | ICD-10-CM | POA: Insufficient documentation

## 2020-02-17 NOTE — Progress Notes (Signed)
Subjective:  Patient ID: Debra Mann, female    DOB: 1946/07/08  Age: 74 y.o. MRN: [id number]  Chief Complaint  Patient presents with  . Hyperlipidemia  . Hypothyroidism    HPI Patient to be evaluated for atrophy of thyroid (acquired).  She is currently taking Synthroid, 88 mcg daily.      Dx with chronic diastolic (congestive) heart failure; she is here today for routine follow-up.  The course of the disease has been stable.  Currently, her treatment regimen consists of a diuretic ( furosemide 20 mg 1/2 daily prn ) losartan 50 mg once daily and carvedilol 3.125 mg twice daily.  History of vtach. Has defibrillator.     Pt presents with hyperlipidemia.  Current treatment includes Crestor.  Compliance with treatment has been poor; she does not follow a diet and exercise regimen.      Debra Mann presents with a diagnosis of nonrheumatic mitral (valve) insufficiency.  The course has been stable and nonprogressive.      Debra Mann presents with a diagnosis of spinal stenosis, lumbar region without neurogenic claudication.  Patient had surgery in 04/2019. She is recovering well. Pain 1/10.   Past Medical History:  Diagnosis Date  . Anoxic brain damage, not elsewhere classified ([place])   . Cardiac arrest, cause unspecified ([place])   . Hyperlipidemia   . Nonrheumatic mitral (valve) insufficiency   . Presence of automatic (implantable) cardiac defibrillator   . Spinal stenosis, lumbar region without neurogenic claudication   . Thyroid disease   . Unilateral primary osteoarthritis, left knee   . Unqualified visual loss of both eyes    Past Surgical History:  Procedure Laterality Date  . ABDOMINAL HYSTERECTOMY    . APPENDECTOMY  1991  . LUMBAR FUSION  04/2019  . PACEMAKER INSERTION  2004, 2013   2014 replaced  lead wires;with biventricular defibrillator: pericardial sac infusion  . REVISION TOTAL HIP ARTHROPLASTY Left 03/2011   blood transfusion 03/2011  . TONSILLECTOMY    . TOTAL VAGINAL  HYSTERECTOMY  1991    Family History  Problem Relation Age of Onset  . Diabetes Mother   . Transient ischemic attack Father    Social History   Socioeconomic History  . Marital status: Married    Spouse name: Not on file  . Number of children: 1  . Years of education: Not on file  . Highest education level: Not on file  Occupational History  . Not on file  Tobacco Use  . Smoking status: Never Smoker  . Smokeless tobacco: Never Used  Substance and Sexual Activity  . Alcohol use: Never  . Drug use: Never  . Sexual activity: Not on file  Other Topics Concern  . Not on file  Social History Narrative  . Not on file   Social Determinants of Health   Financial Resource Strain:   . Difficulty of Paying Living Expenses:   Food Insecurity:   . Worried About Charity fundraiser in the Last Year:   . Arboriculturist in the Last Year:   Transportation Needs:   . Film/video editor (Medical):   Marland Kitchen Lack of Transportation (Non-Medical):   Physical Activity:   . Days of Exercise per Week:   . Minutes of Exercise per Session:   Stress:   . Feeling of Stress :   Social Connections:   . Frequency of Communication with Friends and Family:   . Frequency of Social Gatherings with Friends and Family:   .  Attends Religious Services:   . Active Member of Clubs or Organizations:   . Attends Archivist Meetings:   Marland Kitchen Marital Status:     Review of Systems  Constitutional: Negative for chills, fatigue and fever.  HENT: Negative for congestion, ear pain, rhinorrhea and sore throat.   Respiratory: Negative for cough and shortness of breath.   Cardiovascular: Negative for chest pain.  Gastrointestinal: Negative for abdominal pain, constipation, diarrhea, nausea and vomiting.  Genitourinary: Negative for dysuria and urgency.  Musculoskeletal: Negative for back pain and myalgias.  Neurological: Negative for dizziness, weakness, light-headedness and headaches.    Psychiatric/Behavioral: Negative for dysphoric mood. The patient is not nervous/anxious.      Objective:  BP 138/72   Pulse (!) 56   Temp (!) 97.3 F (36.3 C)   Resp 16   Ht 5\' 2"  (1.575 m)   Wt 156 lb 9.6 oz (71 kg)   BMI 28.64 kg/m   BP/Weight 02/18/2020 11/19/2019 123456  Systolic BP 0000000 0000000 [id number]  Diastolic BP 72 78 91  Wt. (Lbs) 156.6 157.2 -  BMI 28.64 28.75 -    Physical Exam Vitals reviewed.  Constitutional:      Appearance: Normal appearance. She is normal weight.  Cardiovascular:     Rate and Rhythm: Normal rate and regular rhythm.     Pulses: Normal pulses.     Heart sounds: Normal heart sounds.  Pulmonary:     Effort: Pulmonary effort is normal. No respiratory distress.     Breath sounds: Normal breath sounds.  Abdominal:     General: Abdomen is flat. Bowel sounds are normal.     Palpations: Abdomen is soft.     Tenderness: There is no abdominal tenderness.  Neurological:     Mental Status: She is alert and oriented to person, place, and time.  Psychiatric:        Mood and Affect: Mood normal.        Behavior: Behavior normal.     Lab Results  Component Value Date   WBC 6.5 02/18/2020   HGB 12.9 02/18/2020   HCT 40.0 02/18/2020   PLT 273 02/18/2020   GLUCOSE 88 02/18/2020   CHOL 169 02/18/2020   TRIG 83 02/18/2020   HDL 51 02/18/2020   LDLCALC 102 (H) 02/18/2020   ALT 15 02/18/2020   AST 19 02/18/2020   NA 137 02/18/2020   K 4.5 02/18/2020   CL 102 02/18/2020   CREATININE 0.74 02/18/2020   BUN 17 02/18/2020   CO2 22 02/18/2020      Assessment & Plan:  1. Mixed hyperlipidemia Well controlled.  No changes to medicines.  Continue to work on eating a healthy diet and exercise.  Labs drawn today.  - Lipid panel  2. Hypothyroid disease The current medical regimen is effective;  continue present plan and medications.  3. Benign hypertension with coincident congestive heart failure (HCC) Well controlled.  No changes to medicines.   Continue to work on eating a healthy diet and exercise.  Labs drawn today.  - CBC with Differential/Platelet - Comprehensive metabolic panel  4. Anoxic brain injury ([place]) Confusion and slowed response.   Orders Placed This Encounter  Procedures  . CBC with Differential/Platelet  . Lipid panel  . Comprehensive metabolic panel  . Cardiovascular Risk Assessment     Follow-up: Return in about 3 months (around 05/20/2020) for fasting.  An After Visit Summary was printed and given to the patient.  [name] [facility] [phone]

## 2020-02-18 ENCOUNTER — Other Ambulatory Visit: Payer: Self-pay

## 2020-02-18 ENCOUNTER — Ambulatory Visit (INDEPENDENT_AMBULATORY_CARE_PROVIDER_SITE_OTHER): Payer: Medicare Other | Admitting: Family Medicine

## 2020-02-18 VITALS — BP 138/72 | HR 56 | Temp 97.3°F | Resp 16 | Ht 62.0 in | Wt 156.6 lb

## 2020-02-18 DIAGNOSIS — E782 Mixed hyperlipidemia: Secondary | ICD-10-CM

## 2020-02-18 DIAGNOSIS — E079 Disorder of thyroid, unspecified: Secondary | ICD-10-CM | POA: Diagnosis not present

## 2020-02-18 DIAGNOSIS — E038 Other specified hypothyroidism: Secondary | ICD-10-CM | POA: Diagnosis not present

## 2020-02-18 DIAGNOSIS — I110 Hypertensive heart disease with heart failure: Secondary | ICD-10-CM | POA: Diagnosis not present

## 2020-02-18 DIAGNOSIS — G931 Anoxic brain damage, not elsewhere classified: Secondary | ICD-10-CM

## 2020-02-18 DIAGNOSIS — I5022 Chronic systolic (congestive) heart failure: Secondary | ICD-10-CM | POA: Insufficient documentation

## 2020-02-19 LAB — COMPREHENSIVE METABOLIC PANEL
ALT: 15 IU/L (ref 0–32)
AST: 19 IU/L (ref 0–40)
Albumin/Globulin Ratio: 2.2 (ref 1.2–2.2)
Albumin: 4.2 g/dL (ref 3.7–4.7)
Alkaline Phosphatase: 80 IU/L (ref 39–117)
BUN/Creatinine Ratio: 23 (ref 12–28)
BUN: 17 mg/dL (ref 8–27)
Bilirubin Total: 0.5 mg/dL (ref 0.0–1.2)
CO2: 22 mmol/L (ref 20–29)
Calcium: 9.4 mg/dL (ref 8.7–10.3)
Chloride: 102 mmol/L (ref 96–106)
Creatinine, Ser: 0.74 mg/dL (ref 0.57–1.00)
GFR calc Af Amer: 93 mL/min/{1.73_m2} (ref >59)
GFR calc non Af Amer: 81 mL/min/{1.73_m2} (ref >59)
Globulin, Total: 1.9 g/dL (ref 1.5–4.5)
Glucose: 88 mg/dL (ref 65–99)
Potassium: 4.5 mmol/L (ref 3.5–5.2)
Sodium: 137 mmol/L (ref 134–144)
Total Protein: 6.1 g/dL (ref 6.0–8.5)

## 2020-02-19 LAB — CBC WITH DIFFERENTIAL/PLATELET
Basophils Absolute: 0.1 10*3/uL (ref 0.0–0.2)
Basos: 1 %
EOS (ABSOLUTE): 0.3 10*3/uL (ref 0.0–0.4)
Eos: 4 %
Hematocrit: 40 % (ref 34.0–46.6)
Hemoglobin: 12.9 g/dL (ref 11.1–15.9)
Immature Grans (Abs): 0 10*3/uL (ref 0.0–0.1)
Immature Granulocytes: 0 %
Lymphocytes Absolute: 1.5 10*3/uL (ref 0.7–3.1)
Lymphs: 24 %
MCH: 30 pg (ref 26.6–33.0)
MCHC: 32.3 g/dL (ref 31.5–35.7)
MCV: 93 fL (ref 79–97)
Monocytes Absolute: 0.9 10*3/uL (ref 0.1–0.9)
Monocytes: 14 %
Neutrophils Absolute: 3.7 10*3/uL (ref 1.4–7.0)
Neutrophils: 57 %
Platelets: 273 10*3/uL (ref 150–450)
RBC: 4.3 x10E6/uL (ref 3.77–5.28)
RDW: 12.9 % (ref 11.7–15.4)
WBC: 6.5 10*3/uL (ref 3.4–10.8)

## 2020-02-19 LAB — LIPID PANEL
Chol/HDL Ratio: 3.3 ratio (ref 0.0–4.4)
Cholesterol, Total: 169 mg/dL (ref 100–199)
HDL: 51 mg/dL (ref >39)
LDL Chol Calc (NIH): 102 mg/dL — ABNORMAL HIGH (ref 0–99)
Triglycerides: 83 mg/dL (ref 0–149)
VLDL Cholesterol Cal: 16 mg/dL (ref 5–40)

## 2020-02-19 LAB — CARDIOVASCULAR RISK ASSESSMENT

## 2020-02-20 ENCOUNTER — Encounter: Payer: Self-pay | Admitting: Family Medicine

## 2020-02-21 ENCOUNTER — Other Ambulatory Visit: Payer: Self-pay

## 2020-02-21 MED ORDER — ROSUVASTATIN CALCIUM 10 MG PO TABS: 10.0000 mg | ORAL_TABLET | Freq: Every day | ORAL | 1 refills | Status: DC

## 2020-02-27 ENCOUNTER — Other Ambulatory Visit: Payer: Self-pay | Admitting: Family Medicine

## 2020-02-28 ENCOUNTER — Other Ambulatory Visit: Payer: Self-pay | Admitting: Family Medicine

## 2020-03-03 NOTE — Chronic Care Management (AMB) (Signed)
Chronic Care Management Pharmacy  Name: Debra Mann  MRN: [id number] DOB: 1945-11-29  Chief Complaint/ HPI  Debra Mann,  74 y.o. , female presents for their Initial CCM visit with the clinical pharmacist via telephone due to COVID-19 Pandemic.  PCP : [name], MD  Their chronic conditions include: CHF NYHA Class 1 - systolic, Atrial flutter, HTN, Hypothyroidism, Osteopenia, HLD, Pain management.  Office Visits:  02/18/2020 - No medication changes. Chronic disease visit.  11/19/2019- Patient is improving. She is better able to care for herself. Eats healthy. Not exercising.  Vision loss has not improved. No medication changes. 10/03/2019 - medicare physical 09/25/2019 - Transition of care after cardiac arrest. Per Dr. Alyse Low note - Patient lucky to be alive thanks to quick action of husband with home CPR. No new meds.  09/18/2019 - Acute cystitis prescribed bactrim DS. Patient s/p rehab with catheter.  08/12/2019 - increased Crestor to 10 mg due to elevated LDL. CBC/Kidney/Liver - normal.   Consult Visit: 09/10/2019 - Implantable cardioverter defibrillator.  09/01/2019-09/17/2019 - Hospital for cardiac arrest and rehab therafter.  Medications: Outpatient Encounter Medications as of 03/04/2020  Medication Sig  . acetaminophen (TYLENOL) 500 MG tablet Take 500 mg by mouth in the morning and at bedtime.   Marland Kitchen aspirin 81 MG EC tablet Take 81 mg by mouth daily.   . Calcium Carbonate-Vitamin D (CALCIUM 500/D) 500-125 MG-UNIT TABS Take 1 tablet by mouth daily.   . carvedilol (COREG) 12.5 MG tablet Take 1 tablet (12.5 mg total) by mouth 2 (two) times daily with a meal.  . furosemide (LASIX) 20 MG tablet Take 20 mg by mouth daily as needed.   Marland Kitchen levothyroxine (SYNTHROID) 88 MCG tablet Take 1 tablet (88 mcg total) by mouth daily.  Marland Kitchen losartan (COZAAR) 50 MG tablet Take 1 tablet (50 mg total) by mouth daily.  . meloxicam (MOBIC) 15 MG tablet Take by mouth.  . Multiple Vitamins-Minerals  (EYE VITAMINS PO) Take 1 tablet by mouth daily.  . Omega-3 1000 MG CAPS Take 1 capsule by mouth daily.   . rosuvastatin (CRESTOR) 10 MG tablet Take 1 tablet (10 mg total) by mouth daily.  . carvedilol (COREG) 25 MG tablet TAKE 1/2 TABLET BY MOUTH DAILY  . Multiple Vitamin (MULTIVITAMIN) tablet Take 1 tablet by mouth daily.  . rosuvastatin (CRESTOR) 5 MG tablet TAKE 1 TABLET(5 MG) BY MOUTH AT BEDTIME (Patient not taking: Reported on 03/04/2020)   No facility-administered encounter medications on file as of 03/04/2020.   Allergies  Allergen Reactions  . Atorvastatin    SDOH Screenings   Alcohol Screen:   . Last Alcohol Screening Score (AUDIT):   Depression (PHQ2-9): Low Risk   . PHQ-2 Score: 0  Financial Resource Strain:   . Difficulty of Paying Living Expenses:   Food Insecurity: No Food Insecurity  . Worried About Charity fundraiser in the Last Year: Never true  . Ran Out of Food in the Last Year: Never true  Housing: Low Risk   . Last Housing Risk Score: 0  Physical Activity:   . Days of Exercise per Week:   . Minutes of Exercise per Session:   Social Connections:   . Frequency of Communication with Friends and Family:   . Frequency of Social Gatherings with Friends and Family:   . Attends Religious Services:   . Active Member of Clubs or Organizations:   . Attends Archivist Meetings:   Marland Kitchen Marital Status:   Stress:   .  Feeling of Stress :   Tobacco Use: Low Risk   . Smoking Tobacco Use: Never Smoker  . Smokeless Tobacco Use: Never Used  Transportation Needs:   . Film/video editor (Medical):   Marland Kitchen Lack of Transportation (Non-Medical):       Current Diagnosis/Assessment:  Goals Addressed            This Visit's Progress   . Pharmacy Care Plan       CARE PLAN ENTRY  Current Barriers:  . Chronic Disease Management support, education, and care coordination needs related to Hypertension and Hyperlipidemia   Hypertension . Pharmacist Clinical  Goal(s): o Over the next 90 days, patient will work with PharmD and providers to maintain BP goal <130/80 . Current regimen:  o Carvedilol 12.5 mg bid, Furosemide 20 mg daily prn, Losartan 50 mg daily . Interventions: o Recommend patient begin checking blood pressure weekly.  . Patient self care activities - Over the next 90 days, patient will: o Check BP 90, document, and provide at future appointments o Ensure daily salt intake < 2300 mg/day  Hyperlipidemia . Pharmacist Clinical Goal(s): o Over the next 90 days, patient will work with PharmD and providers to achieve LDL goal < 100 mg/dL . Current regimen:  o Rosuvastatin 10 mg daily . Interventions: o Recommend patient consume a low-fat diet to improve cholesterol levels.  . Patient self care activities - Over the next 90 days, patient will: o Work with pharmacist to reduce cholesterol levels by taking rosuvastatin 10 mg daily and being mindful of fat in diet.   Medication management . Pharmacist Clinical Goal(s): o Over the next 90 days, patient will work with PharmD and providers to maintain optimal medication adherence . Current pharmacy: [facility] . Interventions o Comprehensive medication review performed. o Continue current medication management strategy . Patient self care activities - Over the next 90 days, patient will: o Focus on medication adherence by continuing to take medications as scheduled.  o Take medications as prescribed o Report any questions or concerns to PharmD and/or provider(s)  Initial goal documentation        Heart Failure   Type: Systolic  Last ejection fraction: 35-40% NYHA Class: I (no actitivty limitation)   BP today is:  <130/80  Office blood pressures are  BP Readings from Last 3 Encounters:  11/19/19 128/78  12/31/18 (!) 183/91    Patient checks BP at home infrequently  Patient home BP readings are ranging: 120/70 mmHg  Patient has failed these meds in past: hydralazine    Patient is currently controlled on the following medications: carvedilol 12.5 mg twice daily, furosemide 20 mg daily prn,  losartan 50 mg daily  We discussed diet and exercise extensively. Patient limits salt intake. Patient does not do formal exercise. She indicates that she is not having issues with swelling. Patient walks to mailbox and does housework for exercise.   Plan  Continue current medications  and   Hypothyroidism   No results found for: TSH   Patient has failed these meds in past: n/a Patient is currently controlled on the following medications: levothyroxine 88 mcg  We discussed:  Takes appropriately before all other food and medications.   Plan  Continue current medications   Hyperlipidemia   Lipid Panel     Component Value Date/Time   CHOL 169 02/18/2020 0757   TRIG 83 02/18/2020 0757   HDL 51 02/18/2020 0757   CHOLHDL 3.3 02/18/2020 0757   LDLCALC 102 (H) 02/18/2020  0757   LABVLDL 16 02/18/2020 0757     The 10-year ASCVD risk score Mikey Bussing DC Jr., et al., 2013) is: 19.3%   Values used to calculate the score:     Age: 75 years     Sex: Female     Is Non-Hispanic African American: No     Diabetic: No     Tobacco smoker: No     Systolic Blood Pressure: 0000000 mmHg     Is BP treated: Yes     HDL Cholesterol: 51 mg/dL     Total Cholesterol: 169 mg/dL   Patient has failed these meds in past: atorvastatin  Patient is currently uncontrolled on the following medications: rosuvastatin 10 mg qhs, omega-3 1000 mg bid.   We discussed:  diet and exercise extensively. Patient eats "whatever she wants and mostly what she shouldn't". Enjoys sandwiches, chicken, potatoes, potato salad, carrot raisin salad, deviled eggs, salads peas and carrots. Cooks main meals on Saturdays and Sundays. Recommended low-fat diet. Cholesterol medication was increased from 5 mg to 10 mg daily in October.   Plan  Continue current medications   Pain   Patient has failed these meds in  past: not reported Patient is currently controlled on the following medications: tylenol 500 mg q6h prn, meloxicam 15 mg daily  We discussed:  Patient is doing well with pain. She describes it as discomfort at this point. She has recovered well from back surgery. Patient mentioned that her knee is bothering her and wanted to talk about [facility] referral. Educated that [place] therapy is not always covered by insurance and Dr.[name] could give her a kenalog shot in her knee to help. Patient is scheduling an appointment with Dr. Tobie Poet.   Plan  Continue current medications  Osteopenia / Osteoporosis   Last DEXA Scan: 10/22/2018   T-Score femoral neck: -1.6  10-year probability of major osteoporotic fracture: 25.3%  10-year probability of hip fracture: 7.4%  Patient is a candidate for pharmacologic treatment due to T-Score -1.0 to -2.5 and 10-year risk of hip fracture > 3%  Patient has failed these meds in past: n/a Patient is currently uncontrolled on the following medications: Calcium carbonate with D - 500-125 mg daily  We discussed:  Recommend 1200 mg of calcium daily from dietary and supplemental sources.Patient has a history of a broken hip and elevated FRAX score. She currently takes Calcium with D daily but is willing to increase to twice daily with food. We talked about adding a bisphosphonate but patient declined at this time due to concern of potential side effects. She would like to see what increasing Calcium with D to twice daily does on her next Dexa scan January 2022. Pharmacist counseled on the importance of weight bearing exercise for bone health. Patient indicates that she walks quite a bit but could do more.   Plan  Increase Calcium with Vitamin D to twice daily and recheck Bone Mineral Density in January 2022.    Health Maintenance   Patient is currently controlled on the following medications:   Eye supplement Daily - eye vision  We discussed:  Patient was taking  a double dose of the eye support vitamin in attempts of approving vision. Have counseled patient to resume taking at recommended doses due to daily recommendation of the fat soluble vitamins in supplement.   Plan  Continue current medications  Vaccines   Reviewed and discussed patient's vaccination history.  COVID vaccine Moderna [facility] on 12/16/2019 and 01/13/2020.  Immunization History  Administered Date(s) Administered  . Influenza, High Dose Seasonal PF 06/06/2016, 07/03/2017  . Influenza, Seasonal, Injecte, Preservative Fre 11/18/2014  . Influenza-Unspecified 08/17/2013, 08/01/2018, 07/26/2019  . Pneumococcal Conjugate-13 03/17/2016  . Pneumococcal Polysaccharide-23 10/22/2012    Plan  Recommended patient receive annual flu vaccine in office.   Medication Management   Pt uses [place] [facility] for all medications Uses pill box? No - she takes them out as needed.  Pt endorses 100% compliance  We discussed: Patient would rather take them out individually so she doesn't get confused.   Plan  Continue current medication management strategy    Follow up: 3 month phone visit

## 2020-03-04 ENCOUNTER — Ambulatory Visit: Payer: Medicare Other

## 2020-03-04 DIAGNOSIS — I110 Hypertensive heart disease with heart failure: Secondary | ICD-10-CM

## 2020-03-04 DIAGNOSIS — E782 Mixed hyperlipidemia: Secondary | ICD-10-CM

## 2020-03-06 ENCOUNTER — Encounter: Payer: Self-pay | Admitting: Family Medicine

## 2020-03-06 ENCOUNTER — Ambulatory Visit (INDEPENDENT_AMBULATORY_CARE_PROVIDER_SITE_OTHER): Payer: Medicare Other | Admitting: Family Medicine

## 2020-03-06 ENCOUNTER — Other Ambulatory Visit: Payer: Self-pay

## 2020-03-06 VITALS — BP 158/76 | HR 72 | Temp 97.4°F | Resp 18 | Ht 65.0 in | Wt 160.4 lb

## 2020-03-06 DIAGNOSIS — M25562 Pain in left knee: Secondary | ICD-10-CM | POA: Diagnosis not present

## 2020-03-06 DIAGNOSIS — M25561 Pain in right knee: Secondary | ICD-10-CM | POA: Diagnosis not present

## 2020-03-06 MED ORDER — TRIAMCINOLONE ACETONIDE 40 MG/ML IJ SUSP: 80.0000 mg | Freq: Once | INTRAMUSCULAR | Status: AC

## 2020-03-06 NOTE — Patient Instructions (Addendum)
Visit Information  Thank you for your time discussing your medications. I look forward to working with you to achieve your health care goals. Below is a summary of what we talked about during our visit.   Goals Addressed            This Visit's Progress   . Pharmacy Care Plan       CARE PLAN ENTRY  Current Barriers:  . Chronic Disease Management support, education, and care coordination needs related to Hypertension and Hyperlipidemia   Hypertension . Pharmacist Clinical Goal(s): o Over the next 90 days, patient will work with PharmD and providers to maintain BP goal <130/80 . Current regimen:  o Carvedilol 12.5 mg bid, Furosemide 20 mg daily prn, Losartan 50 mg daily . Interventions: o Recommend patient begin checking blood pressure weekly.  . Patient self care activities - Over the next 90 days, patient will: o Check BP 90, document, and provide at future appointments o Ensure daily salt intake < 2300 mg/day  Hyperlipidemia . Pharmacist Clinical Goal(s): o Over the next 90 days, patient will work with PharmD and providers to achieve LDL goal < 100 mg/dL . Current regimen:  o Rosuvastatin 10 mg daily . Interventions: o Recommend patient consume a low-fat diet to improve cholesterol levels.  . Patient self care activities - Over the next 90 days, patient will: o Work with pharmacist to reduce cholesterol levels by taking rosuvastatin 10 mg daily and being mindful of fat in diet.   Medication management . Pharmacist Clinical Goal(s): o Over the next 90 days, patient will work with PharmD and providers to maintain optimal medication adherence . Current pharmacy: [facility] . Interventions o Comprehensive medication review performed. o Continue current medication management strategy . Patient self care activities - Over the next 90 days, patient will: o Focus on medication adherence by continuing to take medications as scheduled.  o Take medications as prescribed o Report  any questions or concerns to PharmD and/or provider(s)  Initial goal documentation        Debra Mann was given information about Chronic Care Management services today including:  1. CCM service includes personalized support from designated clinical staff supervised by her physician, including individualized plan of care and coordination with other care providers 2. 24/7 contact phone numbers for assistance for urgent and routine care needs. 3. Standard insurance, coinsurance, copays and deductibles apply for chronic care management only during months in which we provide at least 20 minutes of these services. Most insurances cover these services at 100%, however patients may be responsible for any copay, coinsurance and/or deductible if applicable. This service may help you avoid the need for more expensive face-to-face services. 4. Only one practitioner may furnish and bill the service in a calendar month. 5. The patient may stop CCM services at any time (effective at the end of the month) by phone call to the office staff.  Patient agreed to services and verbal consent obtained.   The patient verbalized understanding of instructions provided today and agreed to receive a mailed copy of patient instruction and/or educational materials. Telephone follow up appointment with pharmacy team member scheduled for: 06/03/2020   Sherre Poot, PharmD Clinical Pharmacist [name] [phone] (office) [phone] (mobile)  Osteopenia  Osteopenia is a loss of thickness (density) inside of the bones. Another name for osteopenia is low bone mass. Mild osteopenia is a normal part of aging. It is not a disease, and it does not cause symptoms. However,  if you have osteopenia and continue to lose bone mass, you could develop a condition that causes the bones to become thin and break more easily (osteoporosis). You may also lose some height, have back pain, and have a stooped posture.  Although osteopenia is not a disease, making changes to your lifestyle and diet can help to prevent osteopenia from developing into osteoporosis. What are the causes? Osteopenia is caused by loss of calcium in the bones.  Bones are constantly changing. Old bone cells are continually being replaced with new bone cells. This process builds new bone. The mineral calcium is needed to build new bone and maintain bone density. Bone density is usually highest around age 13. After that, most people's bodies cannot replace all the bone they have lost with new bone. What increases the risk? You are more likely to develop this condition if:  You are older than age 34.  You are a woman who went through menopause early.  You have a long illness that keeps you in bed.  You do not get enough exercise.  You lack certain nutrients (malnutrition).  You have an overactive thyroid gland (hyperthyroidism).  You smoke.  You drink a lot of alcohol.  You are taking medicines that weaken the bones, such as steroids. What are the signs or symptoms? This condition does not cause any symptoms. You may have a slightly higher risk for bone breaks (fractures), so getting fractures more easily than normal may be an indication of osteopenia. How is this diagnosed? Your health care provider can diagnose this condition with a special type of X-ray exam that measures bone density (dual-energy X-ray absorptiometry, DEXA). This test can measure bone density in your hips, spine, and wrists. Osteopenia has no symptoms, so this condition is usually diagnosed after a routine bone density screening test is done for osteoporosis. This routine screening is usually done for:  Women who are age 69 or older.  Men who are age 37 or older. If you have risk factors for osteopenia, you may have the screening test at an earlier age. How is this treated? Making dietary and lifestyle changes can lower your risk for osteoporosis. If you  have severe osteopenia that is close to becoming osteoporosis, your health care provider may prescribe medicines and dietary supplements such as calcium and vitamin D. These supplements help to rebuild bone density. Follow these instructions at home:   Take over-the-counter and prescription medicines only as told by your health care provider. These include vitamins and supplements.  Eat a diet that is high in calcium and vitamin D. ? Calcium is found in dairy products, beans, salmon, and leafy green vegetables like spinach and broccoli. ? Look for foods that have vitamin D and calcium added to them (fortified foods), such as orange juice, cereal, and bread.  Do 30 or more minutes of a weight-bearing exercise every day, such as walking, jogging, or playing a sport. These types of exercises strengthen the bones.  Take precautions at home to lower your risk of falling, such as: ? Keeping rooms well-lit and free of clutter, such as cords. ? Installing safety rails on stairs. ? Using rubber mats in the bathroom or other areas that are often wet or slippery.  Do not use any products that contain nicotine or tobacco, such as cigarettes and e-cigarettes. If you need help quitting, ask your health care provider.  Avoid alcohol or limit alcohol intake to no more than 1 drink a day for nonpregnant women  and 2 drinks a day for men. One drink equals 12 oz of beer, 5 oz of wine, or 1 oz of hard liquor.  Keep all follow-up visits as told by your health care provider. This is important. Contact a health care provider if:  You have not had a bone density screening for osteoporosis and you are: ? A woman, age 16 or older. ? A man, age 71 or older.  You are a postmenopausal woman who has not had a bone density screening for osteoporosis.  You are older than age 20 and you want to know if you should have bone density screening for osteoporosis. Summary  Osteopenia is a loss of thickness (density)  inside of the bones. Another name for osteopenia is low bone mass.  Osteopenia is not a disease, but it may increase your risk for a condition that causes the bones to become thin and break more easily (osteoporosis).  You may be at risk for osteopenia if you are older than age 62 or if you are a woman who went through early menopause.  Osteopenia does not cause any symptoms, but it can be diagnosed with a bone density screening test.  Dietary and lifestyle changes are the first treatment for osteopenia. These may lower your risk for osteoporosis. This information is not intended to replace advice given to you by your health care provider. Make sure you discuss any questions you have with your health care provider. Document Revised: 09/15/2017 Document Reviewed: 07/12/2017 Elsevier Patient Education  2020 [employer].

## 2020-03-06 NOTE — Progress Notes (Signed)
Acute Office Visit  Subjective:    Patient ID: Debra Mann, female    DOB: 13-Mar-1946, 74 y.o.   MRN: [id number]  Chief Complaint  Patient presents with  . Knee Pain    HPI Patient is in today for BL knee pain, but the left one has been worse. Stairs hurt. Knee tries to lock up on her. Tried tylenol helps some. Meloxicam is not helping. No xrays have been done.   Past Medical History:  Diagnosis Date  . Anoxic brain damage, not elsewhere classified ([place])   . Cardiac arrest, cause unspecified ([place])   . Hyperlipidemia   . Nonrheumatic mitral (valve) insufficiency   . Presence of automatic (implantable) cardiac defibrillator   . Spinal stenosis, lumbar region without neurogenic claudication   . Thyroid disease   . Unilateral primary osteoarthritis, left knee   . Unqualified visual loss of both eyes     Past Surgical History:  Procedure Laterality Date  . ABDOMINAL HYSTERECTOMY    . APPENDECTOMY  1991  . LUMBAR FUSION  04/2019  . PACEMAKER INSERTION  2004, 2013   2014 replaced  lead wires;with biventricular defibrillator: pericardial sac infusion  . REVISION TOTAL HIP ARTHROPLASTY Left 03/2011   blood transfusion 03/2011  . TONSILLECTOMY    . TOTAL VAGINAL HYSTERECTOMY  1991    Family History  Problem Relation Age of Onset  . Diabetes Mother   . Transient ischemic attack Father     Social History   Socioeconomic History  . Marital status: Married    Spouse name: Not on file  . Number of children: 1  . Years of education: Not on file  . Highest education level: Not on file  Occupational History  . Not on file  Tobacco Use  . Smoking status: Never Smoker  . Smokeless tobacco: Never Used  Substance and Sexual Activity  . Alcohol use: Never  . Drug use: Never  . Sexual activity: Not on file  Other Topics Concern  . Not on file  Social History Narrative  . Not on file   Social Determinants of Health   Financial Resource Strain:   . Difficulty of  Paying Living Expenses:   Food Insecurity: No Food Insecurity  . Worried About Charity fundraiser in the Last Year: Never true  . Ran Out of Food in the Last Year: Never true  Transportation Needs: No Transportation Needs  . Lack of Transportation (Medical): No  . Lack of Transportation (Non-Medical): No  Physical Activity:   . Days of Exercise per Week:   . Minutes of Exercise per Session:   Stress:   . Feeling of Stress :   Social Connections:   . Frequency of Communication with Friends and Family:   . Frequency of Social Gatherings with Friends and Family:   . Attends Religious Services:   . Active Member of Clubs or Organizations:   . Attends Archivist Meetings:   Marland Kitchen Marital Status:   Intimate Partner Violence:   . Fear of Current or Ex-Partner:   . Emotionally Abused:   Marland Kitchen Physically Abused:   . Sexually Abused:     Outpatient Medications Prior to Visit  Medication Sig Dispense Refill  . acetaminophen (TYLENOL) 500 MG tablet Take 500 mg by mouth in the morning and at bedtime.     Marland Kitchen aspirin 81 MG EC tablet Take 81 mg by mouth daily.     . Calcium Carbonate-Vitamin D (CALCIUM 500/D) 500-125 MG-UNIT  TABS Take 1 tablet by mouth daily.     . carvedilol (COREG) 12.5 MG tablet Take 1 tablet (12.5 mg total) by mouth 2 (two) times daily with a meal. 180 tablet 0  . furosemide (LASIX) 20 MG tablet Take 20 mg by mouth daily as needed.     Marland Kitchen levothyroxine (SYNTHROID) 88 MCG tablet Take 1 tablet (88 mcg total) by mouth daily. 30 tablet 2  . losartan (COZAAR) 50 MG tablet Take 1 tablet (50 mg total) by mouth daily. 90 tablet 0  . meloxicam (MOBIC) 15 MG tablet Take by mouth.    . Multiple Vitamin (MULTIVITAMIN) tablet Take 1 tablet by mouth daily.    . Multiple Vitamins-Minerals (EYE VITAMINS PO) Take 1 tablet by mouth daily.    . Omega-3 1000 MG CAPS Take 1 capsule by mouth daily.     . rosuvastatin (CRESTOR) 10 MG tablet Take 1 tablet (10 mg total) by mouth daily. 90 tablet  1  . carvedilol (COREG) 25 MG tablet TAKE 1/2 TABLET BY MOUTH DAILY 45 tablet 0  . rosuvastatin (CRESTOR) 5 MG tablet TAKE 1 TABLET(5 MG) BY MOUTH AT BEDTIME (Patient not taking: Reported on 03/04/2020) 90 tablet 0   No facility-administered medications prior to visit.    Allergies  Allergen Reactions  . Atorvastatin     Review of Systems  Constitutional: Negative for chills, fatigue and fever.  HENT: Negative for congestion, ear pain and sore throat.   Respiratory: Positive for shortness of breath. Negative for cough.   Cardiovascular: Negative for chest pain.  Musculoskeletal: Positive for arthralgias (bilateral knee pain, left greater than right ).  Neurological: Negative for dizziness and headaches.       Objective:    Physical Exam Constitutional:      Appearance: Normal appearance. She is normal weight.  Musculoskeletal:        General: Tenderness (left knee.  Right knee nontender. Full ROM. Negative ligament laxity. ) present. No swelling or deformity.  Neurological:     Mental Status: She is alert.  Psychiatric:        Mood and Affect: Mood normal.        Behavior: Behavior normal.     BP (!) 158/76   Pulse 72   Temp (!) 97.4 F (36.3 C)   Resp 18   Ht 5\' 5"  (1.651 m)   Wt 160 lb 6.4 oz (72.8 kg)   BMI 26.69 kg/m  Wt Readings from Last 3 Encounters:  03/06/20 160 lb 6.4 oz (72.8 kg)  02/18/20 156 lb 9.6 oz (71 kg)  11/19/19 157 lb 3.2 oz (71.3 kg)    Health Maintenance Due  Topic Date Due  . Hepatitis C Screening  Never done  . COVID-19 Vaccine (1) Never done  . TETANUS/TDAP  Never done  . COLONOSCOPY  Never done  . DEXA SCAN  Never done    There are no preventive care reminders to display for this patient.   No results found for: TSH Lab Results  Component Value Date   WBC 6.5 02/18/2020   HGB 12.9 02/18/2020   HCT 40.0 02/18/2020   MCV 93 02/18/2020   PLT 273 02/18/2020   Lab Results  Component Value Date   NA 137 02/18/2020   K 4.5  02/18/2020   CO2 22 02/18/2020   GLUCOSE 88 02/18/2020   BUN 17 02/18/2020   CREATININE 0.74 02/18/2020   BILITOT 0.5 02/18/2020   ALKPHOS 80 02/18/2020   AST 19  02/18/2020   ALT 15 02/18/2020   PROT 6.1 02/18/2020   ALBUMIN 4.2 02/18/2020   CALCIUM 9.4 02/18/2020   Lab Results  Component Value Date   CHOL 169 02/18/2020   Lab Results  Component Value Date   HDL 51 02/18/2020   Lab Results  Component Value Date   LDLCALC 102 (H) 02/18/2020   Lab Results  Component Value Date   TRIG 83 02/18/2020   Lab Results  Component Value Date   CHOLHDL 3.3 02/18/2020   No results found for: HGBA1C     Assessment & Plan:  1. Acute pain of right knee - DG Knee Complete 4 Views Right; Future  2. Acute pain of left knee Risks were discussed including bleeding, infection, increase in sugars if diabetic, atrophy at site of injection, and increased pain.  After consent was obtained, using sterile technique the lateral left knee was prepped with betadine and alcohol.  The joint was entered and 0 ml's of fluid was withdrawn. Kenalog 80 mg and 5 ml plain Lidocaine was then injected and the needle withdrawn.  The procedure was well tolerated.  The patient is asked to continue to rest the joint for a few more days before resuming regular activities.  It may be more painful for the first 1-2 days.  Watch for fever, or increased swelling or persistent pain in the joint. Call or return to clinic prn if such symptoms occur or there is failure to improve as anticipated. - triamcinolone acetonide (KENALOG-40) injection 80 mg - DG Knee Complete 4 Views Left; Future    Meds ordered this encounter  Medications  . triamcinolone acetonide (KENALOG-40) injection 80 mg    Orders Placed This Encounter  Procedures  . DG Knee Complete 4 Views Left  . DG Knee Complete 4 Views Right     Follow-up: No follow-ups on file.  An After Visit Summary was printed and given to the patient.  [name] [facility] [phone]

## 2020-03-09 DIAGNOSIS — M1712 Unilateral primary osteoarthritis, left knee: Secondary | ICD-10-CM | POA: Diagnosis not present

## 2020-03-09 DIAGNOSIS — M25561 Pain in right knee: Secondary | ICD-10-CM | POA: Diagnosis not present

## 2020-03-09 DIAGNOSIS — M25462 Effusion, left knee: Secondary | ICD-10-CM | POA: Diagnosis not present

## 2020-03-09 DIAGNOSIS — M1711 Unilateral primary osteoarthritis, right knee: Secondary | ICD-10-CM | POA: Diagnosis not present

## 2020-03-09 DIAGNOSIS — M25562 Pain in left knee: Secondary | ICD-10-CM | POA: Diagnosis not present

## 2020-03-12 ENCOUNTER — Other Ambulatory Visit: Payer: Self-pay

## 2020-03-12 DIAGNOSIS — M25561 Pain in right knee: Secondary | ICD-10-CM

## 2020-03-12 DIAGNOSIS — M25562 Pain in left knee: Secondary | ICD-10-CM

## 2020-03-13 ENCOUNTER — Other Ambulatory Visit: Payer: Self-pay

## 2020-03-13 DIAGNOSIS — E782 Mixed hyperlipidemia: Secondary | ICD-10-CM

## 2020-03-13 DIAGNOSIS — E038 Other specified hypothyroidism: Secondary | ICD-10-CM

## 2020-03-13 DIAGNOSIS — I110 Hypertensive heart disease with heart failure: Secondary | ICD-10-CM

## 2020-03-13 NOTE — Progress Notes (Signed)
Appt was on 03/04/2020 with Sherre Poot, PharmD.

## 2020-03-18 ENCOUNTER — Encounter: Payer: Self-pay | Admitting: Gastroenterology

## 2020-03-27 ENCOUNTER — Other Ambulatory Visit: Payer: Self-pay | Admitting: Primary Care

## 2020-03-27 LAB — COMPREHENSIVE METABOLIC PANEL
ALT: 11 U/L (ref 0–35)
AST: 21 U/L (ref 0–35)
Albumin: 4.1 g/dL (ref 3.5–5.2)
Alk Phos: 59 U/L (ref 35–105)
Anion Gap: 13 mmol/L (ref 6–15)
Bilirubin,Total: 0.2 mg/dL (ref 0.0–1.2)
CO2: 25 mmol/L (ref 20–28)
Calcium: 9.4 mg/dL (ref 8.6–10.2)
Chloride: 104 mmol/L (ref 96–108)
Creatinine: 0.71 mg/dL (ref 0.51–0.95)
GFR,Black: 97.64 (ref >60)
GFR,Caucasian: 80.69 (ref >60)
Glucose: 93 mg/dL (ref 60–99)
Lab: 5.6 mg/dL — ABNORMAL LOW (ref 6.0–20.0)
Potassium: 4.3 mmol/L (ref 3.3–5.1)
Sodium: 142 mmol/L (ref 133–145)
Total Protein: 7.5 g/dL (ref 6.3–7.7)

## 2020-03-27 LAB — CBC AND DIFFERENTIAL
Baso # K/uL: 0.03 10*3/uL (ref 0.01–0.08)
Basophil %: 0.6 % (ref 0.1–1.2)
Eos # K/uL: 0.23 10*3/uL (ref 0.04–0.36)
Eosinophil %: 4.6 % (ref 0.7–5.8)
Hematocrit: 49.6 % — ABNORMAL HIGH (ref 34.1–44.9)
Hemoglobin: 16 g/dL — ABNORMAL HIGH (ref 11.2–15.7)
IMM Granulocytes #: 0.01 10*3/uL — ABNORMAL HIGH (ref 0.0–0.0)
IMM Granulocytes: 0.2 %
Lymph # K/uL: 1.7 10*3/uL (ref 1.18–3.74)
Lymphocyte %: 33.7 % (ref 19.3–51.7)
MCH: 30.1 pg (ref 25.6–32.2)
MCHC: 32.3 g/dl (ref 32.2–35.5)
MCV: 93.2 fl (ref 79.4–94.8)
Mean Platelet Volume: 10.5 fl (ref 9.4–12.3)
Mono # K/uL: 0.51 10*3/uL (ref 0.20–0.90)
Monocyte %: 10.1 % (ref 4.7–12.5)
Neut # K/uL: 2.57 10*3/uL (ref 1.56–6.13)
Nucl RBC # K/uL: 0 10*3/uL (ref 0.00–0.00)
Nucl RBC %: 0 (ref 0.0–0.2)
Platelets: 205 10*3/uL (ref 160–370)
RBC Distribution Width-SD: 49.6 fl — ABNORMAL HIGH (ref 36.4–46.3)
RBC: 5.32 10*6/uL — ABNORMAL HIGH (ref 3.93–5.22)
RDW: 14.5 % — ABNORMAL HIGH (ref 11.7–14.4)
Seg Neut %: 50.8 % (ref 34.0–71.1)
WBC: 5.05 10*3/uL (ref 3.98–10.04)

## 2020-03-27 LAB — LIPID PANEL
Chol/HDL Ratio: 3.22
Cholesterol: 219 mg/dL — ABNORMAL HIGH (ref 0–200)
HDL: 68 mg/dL — ABNORMAL HIGH (ref 40–60)
LDL Calculated: 123.2 mg/dL
Triglycerides: 139 mg/dL (ref 0–150)

## 2020-03-28 LAB — VITAMIN D: 25-OH Vit Total: 44 ng/mL (ref 30–60)

## 2020-04-07 DIAGNOSIS — I454 Nonspecific intraventricular block: Secondary | ICD-10-CM | POA: Diagnosis not present

## 2020-04-07 DIAGNOSIS — Z950 Presence of cardiac pacemaker: Secondary | ICD-10-CM | POA: Diagnosis not present

## 2020-04-09 ENCOUNTER — Telehealth (INDEPENDENT_AMBULATORY_CARE_PROVIDER_SITE_OTHER): Payer: Medicare Other | Admitting: Physician Assistant

## 2020-04-09 ENCOUNTER — Encounter: Payer: Self-pay | Admitting: Physician Assistant

## 2020-04-09 VITALS — BP 113/66 | HR 70 | Temp 97.2°F

## 2020-04-09 DIAGNOSIS — J060 Acute laryngopharyngitis: Secondary | ICD-10-CM | POA: Insufficient documentation

## 2020-04-09 MED ORDER — AZITHROMYCIN 250 MG PO TABS: ORAL_TABLET | ORAL | 0 refills | Status: DC

## 2020-04-09 NOTE — Progress Notes (Signed)
Virtual Visit via Telephone Note   This visit type was conducted due to national recommendations for restrictions regarding the COVID-19 Pandemic (e.g. social distancing) in an effort to limit this patient's exposure and mitigate transmission in our community.  Due to her co-morbid illnesses, this patient is at least at moderate risk for complications without adequate follow up.  This format is felt to be most appropriate for this patient at this time.  The patient did not have access to video technology/had technical difficulties with video requiring transitioning to audio format only (telephone).  All issues noted in this document were discussed and addressed.  No physical exam could be performed with this format.  Patient verbally consented to a telehealth visit.   Date:  04/09/2020   ID:  [name], DOB June 16, 1946, MRN [id number]  Patient Location: Home Provider Location: Office  PCP:  [name], MD    Chief Complaint:  URI  History of Present Illness:    KATHREEN DILEO is a 74 y.o. female with URI Pt states that she has had sinus congestion, sore throat and drainage for the past several days - she has not had a fever or flu like symptoms - has had a slight cough that has been productive  The patient does not have symptoms concerning for COVID-19 infection (fever, chills, cough, or new shortness of breath).    Past Medical History:  Diagnosis Date  . Anoxic brain damage, not elsewhere classified ([place])   . Cardiac arrest, cause unspecified ([place])   . Hyperlipidemia   . Nonrheumatic mitral (valve) insufficiency   . Presence of automatic (implantable) cardiac defibrillator   . Spinal stenosis, lumbar region without neurogenic claudication   . Thyroid disease   . Unilateral primary osteoarthritis, left knee   . Unqualified visual loss of both eyes    Past Surgical History:  Procedure Laterality Date  . ABDOMINAL HYSTERECTOMY    . APPENDECTOMY  1991  . LUMBAR FUSION   04/2019  . PACEMAKER INSERTION  2004, 2013   2014 replaced  lead wires;with biventricular defibrillator: pericardial sac infusion  . REVISION TOTAL HIP ARTHROPLASTY Left 03/2011   blood transfusion 03/2011  . TONSILLECTOMY    . TOTAL VAGINAL HYSTERECTOMY  1991     No outpatient medications have been marked as taking for the 04/09/20 encounter (Video Visit) with Marge Duncans, PA-C.   Current Facility-Administered Medications for the 04/09/20 encounter (Video Visit) with Marge Duncans, PA-C  Medication  . triamcinolone acetonide (KENALOG-40) injection 80 mg     Allergies:   Atorvastatin   Social History   Tobacco Use  . Smoking status: Never Smoker  . Smokeless tobacco: Never Used  Vaping Use  . Vaping Use: Never used  Substance Use Topics  . Alcohol use: Never  . Drug use: Never     Family Hx: The patient's family history includes Diabetes in her mother; Transient ischemic attack in her father.  ROS:   Please see the history of present illness.    All other systems reviewed and are negative.  Labs/Other Tests and Data Reviewed:    Recent Labs: 02/18/2020: ALT 15; BUN 17; Creatinine, Ser 0.74; Hemoglobin 12.9; Platelets 273; Potassium 4.5; Sodium 137   Recent Lipid Panel Lab Results  Component Value Date/Time   CHOL 169 02/18/2020 07:57 AM   TRIG 83 02/18/2020 07:57 AM   HDL 51 02/18/2020 07:57 AM   CHOLHDL 3.3 02/18/2020 07:57 AM   LDLCALC 102 (H) 02/18/2020 07:57 AM  Wt Readings from Last 3 Encounters:  03/06/20 160 lb 6.4 oz (72.8 kg)  02/18/20 156 lb 9.6 oz (71 kg)  11/19/19 157 lb 3.2 oz (71.3 kg)     Objective:    Vital Signs:  BP 113/66   Pulse 70   Temp (!) 97.2 F (36.2 C)   SpO2 98%    VITAL SIGNS:  reviewed  ASSESSMENT & PLAN:    1. URI - recommend otc mucinex and rx for zpack as directed - to follow up if symptoms persist or worsen  COVID-19 Education: The signs and symptoms of COVID-19 were discussed with the patient and how to seek care  for testing (follow up with PCP or arrange E-visit). The importance of social distancing was discussed today.  Time:   Today, I have spent 10 minutes with the patient with telehealth technology discussing the above problems.     Medication Adjustments/Labs and Tests Ordered: Current medicines are reviewed at length with the patient today.  Concerns regarding medicines are outlined above.   Tests Ordered: No orders of the defined types were placed in this encounter.   Medication Changes: Meds ordered this encounter  Medications  . azithromycin (ZITHROMAX) 250 MG tablet    Sig: 2 po day one then one po days 2-5    Dispense:  6 tablet    Refill:  0    Order Specific Question:   Supervising Provider    AnswerShelton Silvas    Follow Up:  In Person prn  Signed, Webb Silversmith, PA-C  04/09/2020 3:00 PM    [place]

## 2020-04-09 NOTE — Assessment & Plan Note (Signed)
otc decongestants as directed rx for zpack as directed

## 2020-04-27 ENCOUNTER — Ambulatory Visit: Payer: Medicare (Managed Care) | Admitting: Orthopedic Surgery

## 2020-04-27 ENCOUNTER — Encounter: Payer: Self-pay | Admitting: Orthopedic Surgery

## 2020-04-27 DIAGNOSIS — G5603 Carpal tunnel syndrome, bilateral upper limbs: Secondary | ICD-10-CM

## 2020-04-27 DIAGNOSIS — G5622 Lesion of ulnar nerve, left upper limb: Secondary | ICD-10-CM

## 2020-04-27 DIAGNOSIS — Z4502 Encounter for adjustment and management of automatic implantable cardiac defibrillator: Secondary | ICD-10-CM | POA: Diagnosis not present

## 2020-04-27 NOTE — Progress Notes (Signed)
ORTHOPAEDIC HAND SURGERY - HISTORY & PHYSICAL     CC: Bilateral hand paresthesias    HPI: Kylie Parker is a 74 y.o. female who presents with bilateral hand paresthesias.  This has bothered her for the last 5 to 10 years and is gradually worsening.  She notes numbness including all 5 fingertips of both hands which is constant. Left side is worse than the right.  She also reports that she has trouble picking up and holding objects and often drops them.  She also gets occasional sharp shooting pains that radiate from the wrist up towards the elbow.  She has had recent electrodiagnostic studies done.  In the past she has worn a wrist brace and this has not been helpful so she stopped wearing it.  Denies any injections nor therapy.  Medical history includes COPD.  She is an active smoker.  Does not use home oxygen.    The patient is right hand dominant.     Medical History:    Past Medical History:   Diagnosis Date    ADHD (attention deficit hyperactivity disorder)     Angina     Arthritis     Bipolar 1 disorder     Chest pain, unspecified     pressure    COPD (chronic obstructive pulmonary disease)     Fibromyalgia     Hypertension     Shortness of breath        Past Surgical History:   Procedure Laterality Date    COLON SURGERY      abcess    HAND SURGERY Bilateral     trigger finger bilateral    TONSILLECTOMY         Allergies   Allergen Reactions    Seasonal Allergies Itching    Acid Blockers Support Rash         Current Outpatient Medications:     metoprolol (TOPROL-XL) 25 MG 24 hr tablet, Take 25 mg by mouth daily   Do not crush or chew. May be divided., Disp: , Rfl:     lisinopril-hydrochlorothiazide (PRINZIDE,ZESTORETIC) 20-12.5 MG per tablet, Take 1 tablet by mouth every morning, Disp: , Rfl:     isosorbide mononitrate (IMDUR) 30 MG 24 hr tablet, Take 30 mg by mouth daily, Disp: , Rfl:     albuterol-ipratropium (COMBIVENT) 18-103 MCG/ACT inhaler, Inhale 2 puffs into the lungs daily   Shake  well before each use., Disp: , Rfl:     methylphenidate (RITALIN) 10 MG tablet, Take 10 mg by mouth 2 times daily (before meals)   , Disp: , Rfl:     melatonin 3 MG, Take 3 mg by mouth nightly as needed, Disp: , Rfl:     Ascorbic Acid (VITAMIN C) 250 MG tablet, Take 250 mg by mouth daily., Disp: , Rfl:     oxycodone-acetaminophen (PERCOCET) 5-325 MG per tablet, Take 1 tablet by mouth every 4 hours as needed., Disp: , Rfl:     alprazolam (XANAX) 0.5 MG tablet, Take 0.5 mg by mouth 2 times daily as needed., Disp: , Rfl:     ezetimibe (ZETIA) 10 MG tablet, Take 10 mg by mouth daily., Disp: , Rfl:     pregabalin (LYRICA) 75 MG capsule, Take 75 mg by mouth 2 times daily., Disp: , Rfl:     aspirin 81 MG EC tablet, Take 81 mg by mouth daily., Disp: , Rfl:     Calcium Carbonate-Vitamin D (CALCIUM + D) 600-200 MG-UNIT per tablet, Take  2 tablets by mouth daily., Disp: , Rfl:     Multiple Vitamin (MULTIVITAMIN) per tablet, Take 1 tablet by mouth daily., Disp: , Rfl:     Social History     Socioeconomic History    Marital status: Divorced     Spouse name: Not on file    Number of children: Not on file    Years of education: Not on file    Highest education level: Not on file   Occupational History    Not on file   Tobacco Use    Smoking status: Current Every Day Smoker     Packs/day: 1.00     Years: 50.00     Pack years: 50.00    Smokeless tobacco: Never Used   Substance and Sexual Activity    Alcohol use: No    Drug use: No    Sexual activity: Not on file   Social History Narrative    Not on file         Family History   Problem Relation Age of Onset    Heart Disease Mother     Heart Disease Father     Cancer Sister         Leukemia       ROS: A 7 point review of systems was performed and was negative with the exception of bilateral hand paresthesias.    Please note that the patient's intake questionnaire, including medical history, surgical history, allergies, current medications, and review of systems  has been independently reviewed, and has been scanned and loaded electronically into the chart.    Exam:  There were no vitals taken for this visit.   Constitutional: No acute distress, alert, oriented  MSK:   right upper extremity   Warm and well perfused  Skin intact  Brisk capillary refill throughout  No deformity, edema, or ecchymosis  Motor function intact to AIN, PIN, and ulnar nerves  Sensation intact to light touch median, ulnar, and radial nerves  Able to make a composite fist  No evidence of thenar nor interosseous atrophy  Thumb to small finger opposition strength 4/5  negative volar wrist flexion compression test  positive volar wrist/palm Tinel  Negative medial elbow Tinel    Left upper extremity   Warm and well perfused  Skin intact  Brisk capillary refill throughout  No deformity, edema, or ecchymosis  Motor function intact to AIN, PIN, and ulnar nerves  Sensation intact to light touch median, ulnar, and radial nerves  Able to make a composite fist  No evidence of thenar nor interosseous atrophy  Thumb to small finger opposition strength 4/5  positivevolar wrist flexion compression test  positive volar wrist/palm Tinel  Negative medial elbow Tinel    Electrodiagnostic studies: Performed 02/28/2020.  These demonstrate evidence of moderate bilateral carpal tunnel syndrome as well as mild sensory axonal and demyelinating neuropathy of the bilateral ulnar nerves at the wrist.    Assessment: ETHELDA Parker is a 74 y.o. female who presents with bilateral moderate carpal tunnel syndrome and bilateral ulnar nerve compression at the wrist    Plan: The patient has years of worsening and now constant bilateral hand paresthesias of all five fingers.  She has attempted and failed previous brace use.  Left side is worse than the right.  She has had recent nerve studies done demonstrating moderate bilateral carpal tunnel syndrome as well as ulnar neuropathy at the wrist.  Treatment options discussed to include  conservative care as well  as surgery.  The patient is most interested to pursue surgery.  I reviewed with her that given the compression of the ulnar nerve at the wrist this would entail an open carpal tunnel release and then through the same incision ulnar nerve release at the hand/wrist.  The patient is eager to pursue surgery.  The risks, benefits, and alternatives to surgery were discussed at length with the patient today.  I discussed the risk of infection, wound healing complications, damage to local structures such as the flexor tendons, median nerve, ulnar neurovascular bundle.  I also discussed risk of wrist/hand stiffness, incomplete symptom resolution especially given the severity of the electrodiagnostic study findings, and chronic pain.  All the patient's questions were answered to her satisfaction.  Therefore, plan for left open carpal tunnel release and left ulnar nerve decompression of the hand/wrist.  [facility]. Monitored anesthesia care with local.  Request medical clearance for surgery.    Return for surgery.    Pennie Banter. Romeo Apple, MD  Assistant Professor of Clinical Orthopaedics  Division of Hand, Wrist, & Upper Extremity  [facility]   Office: [phone]    This note was dictated using speech recognition software.  A thorough attempt was made to proof read and correct any errors.

## 2020-04-29 DIAGNOSIS — H353132 Nonexudative age-related macular degeneration, bilateral, intermediate dry stage: Secondary | ICD-10-CM | POA: Diagnosis not present

## 2020-05-04 ENCOUNTER — Telehealth: Payer: Self-pay | Admitting: Orthopedic Surgery

## 2020-05-04 NOTE — Telephone Encounter (Signed)
Kylie Parker called she got a letter from her insurance that they are approving her surgery, but in the letter they put ulnar nerve elbow. She just wanted to make sure that she isn't getting surgery on her elbow that it is being done on her hand/wrist. I didn't want to give her the wrong information, can you just verify this and let me know I will call her back.

## 2020-05-13 ENCOUNTER — Other Ambulatory Visit: Payer: Self-pay

## 2020-05-13 DIAGNOSIS — I5022 Chronic systolic (congestive) heart failure: Secondary | ICD-10-CM

## 2020-05-13 MED ORDER — LOSARTAN POTASSIUM 50 MG PO TABS: 50.0000 mg | ORAL_TABLET | Freq: Every day | ORAL | 0 refills | Status: DC

## 2020-05-13 MED ORDER — CARVEDILOL 12.5 MG PO TABS: 12.5000 mg | ORAL_TABLET | Freq: Two times a day (BID) | ORAL | 0 refills | Status: DC

## 2020-05-15 ENCOUNTER — Other Ambulatory Visit: Payer: Self-pay | Admitting: Family Medicine

## 2020-05-15 ENCOUNTER — Telehealth: Payer: Self-pay

## 2020-05-15 DIAGNOSIS — Z1211 Encounter for screening for malignant neoplasm of colon: Secondary | ICD-10-CM

## 2020-05-15 NOTE — Telephone Encounter (Signed)
Order sent. Kc

## 2020-05-15 NOTE — Telephone Encounter (Signed)
Patient lvm stating she received a letter from [employer] telling her she is due for another cologuard test.

## 2020-05-20 NOTE — Progress Notes (Signed)
Subjective:  Patient ID: Debra Mann, female    DOB: 03/16/1946  Age: 74 y.o. MRN: [id number]  Chief Complaint  Patient presents with  . Hyperlipidemia    HPI  Patient to be evaluated for atrophy of thyroid (acquired).  She is currently taking Synthroid, 88 mcg daily.      Dx with chronic diastolic (congestive) heart failure; she is here today for routine follow-up.  The course of the disease has been stable.  Currently, her treatment regimen consists of a diuretic ( furosemide 20 mg 1/2 daily prn ) losartan 50 mg once daily and carvedilol 3.125 mg twice daily.  History of vtach. Has defibrillator (this is her second.) Due for September for follow up with Dr. Phillis Haggis.     Pt presents with hyperlipidemia.  Current treatment includes Crestor.  Compliance with treatment has been poor; she does not follow a diet and exercise regimen.      Debra Mann presents with a diagnosis of nonrheumatic mitral (valve) insufficiency.  The course has been stable and nonprogressive.      Anoxic brain damage which occurred In 08/2019 - caused vision loss of BL eyes (improved, but no fully better.)   Past Medical History:  Diagnosis Date  . Anoxic brain damage, not elsewhere classified ([place])   . Cardiac arrest, cause unspecified ([place])   . Hyperlipidemia   . Nonrheumatic mitral (valve) insufficiency   . Presence of automatic (implantable) cardiac defibrillator   . Spinal stenosis, lumbar region without neurogenic claudication   . Thyroid disease   . Unilateral primary osteoarthritis, left knee   . Unqualified visual loss of both eyes    Past Surgical History:  Procedure Laterality Date  . ABDOMINAL HYSTERECTOMY    . APPENDECTOMY  1991  . LUMBAR FUSION  04/2019  . PACEMAKER INSERTION  2004, 2013   2014 replaced  lead wires;with biventricular defibrillator: pericardial sac infusion  . REVISION TOTAL HIP ARTHROPLASTY Left 03/2011   blood transfusion 03/2011  . TONSILLECTOMY    . TOTAL VAGINAL  HYSTERECTOMY  1991    Family History  Problem Relation Age of Onset  . Diabetes Mother   . Transient ischemic attack Father    Social History   Socioeconomic History  . Marital status: Married    Spouse name: Not on file  . Number of children: 1  . Years of education: Not on file  . Highest education level: Not on file  Occupational History  . Not on file  Tobacco Use  . Smoking status: Never Smoker  . Smokeless tobacco: Never Used  Vaping Use  . Vaping Use: Never used  Substance and Sexual Activity  . Alcohol use: Never  . Drug use: Never  . Sexual activity: Not on file  Other Topics Concern  . Not on file  Social History Narrative  . Not on file   Social Determinants of Health   Financial Resource Strain:   . Difficulty of Paying Living Expenses:   Food Insecurity: No Food Insecurity  . Worried About Charity fundraiser in the Last Year: Never true  . Ran Out of Food in the Last Year: Never true  Transportation Needs: No Transportation Needs  . Lack of Transportation (Medical): No  . Lack of Transportation (Non-Medical): No  Physical Activity:   . Days of Exercise per Week:   . Minutes of Exercise per Session:   Stress:   . Feeling of Stress :   Social Connections:   . Frequency  of Communication with Friends and Family:   . Frequency of Social Gatherings with Friends and Family:   . Attends Religious Services:   . Active Member of Clubs or Organizations:   . Attends Archivist Meetings:   Marland Kitchen Marital Status:     Review of Systems  Constitutional: Positive for fatigue. Negative for chills and fever.  HENT: Negative for congestion, ear pain, rhinorrhea and sore throat.   Respiratory: Positive for shortness of breath (with exertion.). Negative for cough.   Cardiovascular: Negative for chest pain, palpitations and leg swelling.  Gastrointestinal: Positive for abdominal distention. Negative for abdominal pain, constipation, diarrhea, nausea and  vomiting.  Genitourinary: Negative for dysuria and urgency.  Musculoskeletal: Positive for arthralgias (quicky knee pain. ). Negative for back pain and myalgias.  Neurological: Positive for weakness (since 08/2019). Negative for dizziness, light-headedness and headaches.  Psychiatric/Behavioral: Negative for dysphoric mood. The patient is not nervous/anxious.      Objective:  BP (!) 144/72   Pulse 76   Temp (!) 97.5 F (36.4 C)   Resp 18   Ht 5\' 5"  (1.651 m)   Wt 158 lb (71.7 kg)   BMI 26.29 kg/m   BP/Weight 05/21/2020 04/09/2020 7/35/3299  Systolic BP 242 683 419  Diastolic BP 72 66 76  Wt. (Lbs) 158 - 160.4  BMI 26.29 - 26.69    Physical Exam Vitals reviewed.  Constitutional:      Appearance: Normal appearance. She is normal weight.  Cardiovascular:     Rate and Rhythm: Normal rate and regular rhythm.     Pulses: Normal pulses.     Heart sounds: Normal heart sounds.  Pulmonary:     Effort: Pulmonary effort is normal. No respiratory distress.     Breath sounds: Normal breath sounds.  Abdominal:     General: Abdomen is flat. Bowel sounds are normal.     Palpations: Abdomen is soft. There is no mass.     Tenderness: There is no abdominal tenderness.  Neurological:     Mental Status: She is alert and oriented to person, place, and time.  Psychiatric:        Mood and Affect: Mood normal.        Behavior: Behavior normal.     Lab Results  Component Value Date   WBC 6.5 02/18/2020   HGB 12.9 02/18/2020   HCT 40.0 02/18/2020   PLT 273 02/18/2020   GLUCOSE 88 02/18/2020   CHOL 169 02/18/2020   TRIG 83 02/18/2020   HDL 51 02/18/2020   LDLCALC 102 (H) 02/18/2020   ALT 15 02/18/2020   AST 19 02/18/2020   NA 137 02/18/2020   K 4.5 02/18/2020   CL 102 02/18/2020   CREATININE 0.74 02/18/2020   BUN 17 02/18/2020   CO2 22 02/18/2020      Assessment & Plan:   1. Benign hypertension with coincident congestive heart failure (HCC) Well controlled.  No changes to  medicines.  Continue to work on eating a healthy diet and exercise.  Labs drawn today.  - Comprehensive metabolic panel - CBC with Differential/Platelet  2. Non-rheumatic mitral regurgitation Monitored by cardiology  3. Mixed hyperlipidemia Well controlled.  No changes to medicines.  Continue to work on eating a healthy diet and exercise.  Labs drawn today.  - Lipid panel  4. Acquired hypothyroidism - TSH  5. Vision loss, bilateral Stable. Partial loss.   6. Nonischemic dilated cardiomyopathy ([place]) Defibrillator. Keep follow up with Dr .Phillis Haggis  7.  BMI 26.0-26.9,adult  8. Abdominal distension - no masses on exam. No ascities (fluid wave.)  Orders Placed This Encounter  Procedures  . Lipid panel  . TSH  . Comprehensive metabolic panel  . CBC with Differential/Platelet     Follow-up: Return in about 6 months (around 11/21/2020) for fasting.  An After Visit Summary was printed and given to the patient.  Debra Mann [facility] [phone]

## 2020-05-21 ENCOUNTER — Ambulatory Visit (INDEPENDENT_AMBULATORY_CARE_PROVIDER_SITE_OTHER): Payer: Medicare Other | Admitting: Family Medicine

## 2020-05-21 ENCOUNTER — Other Ambulatory Visit: Payer: Self-pay

## 2020-05-21 ENCOUNTER — Encounter: Payer: Self-pay | Admitting: Family Medicine

## 2020-05-21 VITALS — BP 144/72 | HR 76 | Temp 97.5°F | Resp 18 | Ht 65.0 in | Wt 158.0 lb

## 2020-05-21 DIAGNOSIS — I340 Nonrheumatic mitral (valve) insufficiency: Secondary | ICD-10-CM

## 2020-05-21 DIAGNOSIS — R140 Abdominal distension (gaseous): Secondary | ICD-10-CM

## 2020-05-21 DIAGNOSIS — I110 Hypertensive heart disease with heart failure: Secondary | ICD-10-CM | POA: Diagnosis not present

## 2020-05-21 DIAGNOSIS — H543 Unqualified visual loss, both eyes: Secondary | ICD-10-CM | POA: Diagnosis not present

## 2020-05-21 DIAGNOSIS — E782 Mixed hyperlipidemia: Secondary | ICD-10-CM | POA: Diagnosis not present

## 2020-05-21 DIAGNOSIS — E039 Hypothyroidism, unspecified: Secondary | ICD-10-CM

## 2020-05-21 DIAGNOSIS — I420 Dilated cardiomyopathy: Secondary | ICD-10-CM

## 2020-05-21 DIAGNOSIS — Z6826 Body mass index (BMI) 26.0-26.9, adult: Secondary | ICD-10-CM

## 2020-05-21 NOTE — Patient Instructions (Signed)
Recommend chair exercises to increase strength.

## 2020-05-22 LAB — COMPREHENSIVE METABOLIC PANEL
ALT: 13 IU/L (ref 0–32)
AST: 15 IU/L (ref 0–40)
Albumin/Globulin Ratio: 2.1 (ref 1.2–2.2)
Albumin: 4.2 g/dL (ref 3.7–4.7)
Alkaline Phosphatase: 78 IU/L (ref 48–121)
BUN/Creatinine Ratio: 18 (ref 12–28)
BUN: 15 mg/dL (ref 8–27)
Bilirubin Total: 0.5 mg/dL (ref 0.0–1.2)
CO2: 24 mmol/L (ref 20–29)
Calcium: 9.9 mg/dL (ref 8.7–10.3)
Chloride: 101 mmol/L (ref 96–106)
Creatinine, Ser: 0.85 mg/dL (ref 0.57–1.00)
GFR calc Af Amer: 79 mL/min/{1.73_m2} (ref >59)
GFR calc non Af Amer: 68 mL/min/{1.73_m2} (ref >59)
Globulin, Total: 2 g/dL (ref 1.5–4.5)
Glucose: 89 mg/dL (ref 65–99)
Potassium: 4.7 mmol/L (ref 3.5–5.2)
Sodium: 137 mmol/L (ref 134–144)
Total Protein: 6.2 g/dL (ref 6.0–8.5)

## 2020-05-22 LAB — CBC WITH DIFFERENTIAL/PLATELET
Basophils Absolute: 0 10*3/uL (ref 0.0–0.2)
Basos: 1 %
EOS (ABSOLUTE): 0.3 10*3/uL (ref 0.0–0.4)
Eos: 5 %
Hematocrit: 39.2 % (ref 34.0–46.6)
Hemoglobin: 12.8 g/dL (ref 11.1–15.9)
Immature Grans (Abs): 0 10*3/uL (ref 0.0–0.1)
Immature Granulocytes: 0 %
Lymphocytes Absolute: 1.6 10*3/uL (ref 0.7–3.1)
Lymphs: 24 %
MCH: 30.8 pg (ref 26.6–33.0)
MCHC: 32.7 g/dL (ref 31.5–35.7)
MCV: 94 fL (ref 79–97)
Monocytes Absolute: 1 10*3/uL — ABNORMAL HIGH (ref 0.1–0.9)
Monocytes: 15 %
Neutrophils Absolute: 3.6 10*3/uL (ref 1.4–7.0)
Neutrophils: 55 %
Platelets: 288 10*3/uL (ref 150–450)
RBC: 4.16 x10E6/uL (ref 3.77–5.28)
RDW: 12.1 % (ref 11.7–15.4)
WBC: 6.4 10*3/uL (ref 3.4–10.8)

## 2020-05-22 LAB — LIPID PANEL
Chol/HDL Ratio: 2.7 ratio (ref 0.0–4.4)
Cholesterol, Total: 156 mg/dL (ref 100–199)
HDL: 58 mg/dL (ref >39)
LDL Chol Calc (NIH): 84 mg/dL (ref 0–99)
Triglycerides: 70 mg/dL (ref 0–149)
VLDL Cholesterol Cal: 14 mg/dL (ref 5–40)

## 2020-05-22 LAB — TSH: TSH: 0.789 u[IU]/mL (ref 0.450–4.500)

## 2020-05-22 LAB — CARDIOVASCULAR RISK ASSESSMENT

## 2020-06-03 ENCOUNTER — Other Ambulatory Visit: Payer: Self-pay

## 2020-06-03 ENCOUNTER — Telehealth: Payer: Self-pay

## 2020-06-03 ENCOUNTER — Telehealth: Payer: Medicare Other

## 2020-06-03 DIAGNOSIS — Z01818 Encounter for other preprocedural examination: Secondary | ICD-10-CM

## 2020-06-03 NOTE — Telephone Encounter (Signed)
Spoke to patient in regards to upcoming surgery with Dr. Romeo Apple.  Surgery is scheduled for 07/01/20 at [facility].  Patient is aware she is to go to Urgent Care for a covid swab on 06/27/20.  Post op appointment scheduled for 07/13/20 @ 10:15am in [place] office.  Patient expressed understanding and all info was mailed.

## 2020-06-03 NOTE — Progress Notes (Signed)
Unsuccessful outreach to patient. Called patient for medication adherence call. Left voicemail for patient to return call. Martinique Uselman, [place] Pharmacist Assistant  [phone]

## 2020-06-26 ENCOUNTER — Other Ambulatory Visit: Payer: Self-pay | Admitting: Emergency Medicine

## 2020-06-27 LAB — COVID-19 PCR

## 2020-06-27 LAB — COVID-19 NAAT (PCR): COVID-19 NAAT (PCR): NEGATIVE

## 2020-06-29 ENCOUNTER — Encounter: Payer: Self-pay | Admitting: Orthopedic Surgery

## 2020-06-29 NOTE — Progress Notes (Signed)
ORTHOPAEDIC HAND SURGERY - HISTORY & PHYSICAL     CC: Bilateral hand paresthesias    HPI: Kylie Parker is a 74 y.o. female who presents with bilateral hand paresthesias.  This has bothered her for the last 5 to 10 years and is gradually worsening.  She notes numbness including all 5 fingertips of both hands which is constant. Left side is worse than the right.  She also reports that she has trouble picking up and holding objects and often drops them.  She also gets occasional sharp shooting pains that radiate from the wrist up towards the elbow.  She has had recent electrodiagnostic studies done.  In the past she has worn a wrist brace and this has not been helpful so she stopped wearing it.  Denies any injections nor therapy.  Medical history includes COPD.  She is an active smoker.  Does not use home oxygen.    The patient is right hand dominant.     Medical History:    Past Medical History:   Diagnosis Date    ADHD (attention deficit hyperactivity disorder)     Angina     Arthritis     Bipolar 1 disorder     Chest pain, unspecified     pressure    COPD (chronic obstructive pulmonary disease)     Fibromyalgia     Hypertension     Shortness of breath        Past Surgical History:   Procedure Laterality Date    COLON SURGERY      abcess    HAND SURGERY Bilateral     trigger finger bilateral    TONSILLECTOMY         Allergies   Allergen Reactions    Seasonal Allergies Itching    Acid Blockers Support Rash         Current Outpatient Medications:     metoprolol (TOPROL-XL) 25 MG 24 hr tablet, Take 25 mg by mouth daily   Do not crush or chew. May be divided., Disp: , Rfl:     lisinopril-hydrochlorothiazide (PRINZIDE,ZESTORETIC) 20-12.5 MG per tablet, Take 1 tablet by mouth every morning, Disp: , Rfl:     isosorbide mononitrate (IMDUR) 30 MG 24 hr tablet, Take 30 mg by mouth daily, Disp: , Rfl:     albuterol-ipratropium (COMBIVENT) 18-103 MCG/ACT inhaler, Inhale 2 puffs into the lungs daily   Shake  well before each use., Disp: , Rfl:     methylphenidate (RITALIN) 10 MG tablet, Take 10 mg by mouth 2 times daily (before meals)   , Disp: , Rfl:     melatonin 3 MG, Take 3 mg by mouth nightly as needed, Disp: , Rfl:     Ascorbic Acid (VITAMIN C) 250 MG tablet, Take 250 mg by mouth daily., Disp: , Rfl:     oxycodone-acetaminophen (PERCOCET) 5-325 MG per tablet, Take 1 tablet by mouth every 4 hours as needed., Disp: , Rfl:     alprazolam (XANAX) 0.5 MG tablet, Take 0.5 mg by mouth 2 times daily as needed., Disp: , Rfl:     ezetimibe (ZETIA) 10 MG tablet, Take 10 mg by mouth daily., Disp: , Rfl:     pregabalin (LYRICA) 75 MG capsule, Take 75 mg by mouth 2 times daily., Disp: , Rfl:     aspirin 81 MG EC tablet, Take 81 mg by mouth daily., Disp: , Rfl:     Calcium Carbonate-Vitamin D (CALCIUM + D) 600-200 MG-UNIT per tablet, Take  2 tablets by mouth daily., Disp: , Rfl:     Multiple Vitamin (MULTIVITAMIN) per tablet, Take 1 tablet by mouth daily., Disp: , Rfl:     Social History     Socioeconomic History    Marital status: Divorced     Spouse name: Not on file    Number of children: Not on file    Years of education: Not on file    Highest education level: Not on file   Occupational History    Not on file   Tobacco Use    Smoking status: Current Every Day Smoker     Packs/day: 1.00     Years: 50.00     Pack years: 50.00    Smokeless tobacco: Never Used   Substance and Sexual Activity    Alcohol use: No    Drug use: No    Sexual activity: Not on file   Social History Narrative    Not on file         Family History   Problem Relation Age of Onset    Heart Disease Mother     Heart Disease Father     Cancer Sister         Leukemia       ROS: A 7 point review of systems was performed and was negative with the exception of bilateral hand paresthesias.    Please note that the patient's intake questionnaire, including medical history, surgical history, allergies, current medications, and review of systems  has been independently reviewed, and has been scanned and loaded electronically into the chart.    Exam:  There were no vitals taken for this visit.   Constitutional: No acute distress, alert, oriented  MSK:   right upper extremity   Warm and well perfused  Skin intact  Brisk capillary refill throughout  No deformity, edema, or ecchymosis  Motor function intact to AIN, PIN, and ulnar nerves  Sensation intact to light touch median, ulnar, and radial nerves  Able to make a composite fist  No evidence of thenar nor interosseous atrophy  Thumb to small finger opposition strength 4/5  negative volar wrist flexion compression test  positive volar wrist/palm Tinel  Negative medial elbow Tinel    Left upper extremity   Warm and well perfused  Skin intact  Brisk capillary refill throughout  No deformity, edema, or ecchymosis  Motor function intact to AIN, PIN, and ulnar nerves  Sensation intact to light touch median, ulnar, and radial nerves  Able to make a composite fist  No evidence of thenar nor interosseous atrophy  Thumb to small finger opposition strength 4/5  positivevolar wrist flexion compression test  positive volar wrist/palm Tinel  Negative medial elbow Tinel    Electrodiagnostic studies: Performed 02/28/2020.  These demonstrate evidence of moderate bilateral carpal tunnel syndrome as well as mild sensory axonal and demyelinating neuropathy of the bilateral ulnar nerves at the wrist.    Assessment: Kylie Parker is a 74 y.o. female who presents with bilateral moderate carpal tunnel syndrome and bilateral ulnar nerve compression at the wrist    Plan: The patient has years of worsening and now constant bilateral hand paresthesias of all five fingers.  She has attempted and failed previous brace use.  Left side is worse than the right.  She has had recent nerve studies done demonstrating moderate bilateral carpal tunnel syndrome as well as ulnar neuropathy at the wrist.  Treatment options discussed to include  conservative care as well  as surgery.  The patient is most interested to pursue surgery.  I reviewed with her that given the compression of the ulnar nerve at the wrist this would entail an open carpal tunnel release and then through the same incision ulnar nerve release at the hand/wrist.  The patient is eager to pursue surgery.  The risks, benefits, and alternatives to surgery were discussed at length with the patient today.  I discussed the risk of infection, wound healing complications, damage to local structures such as the flexor tendons, median nerve, ulnar neurovascular bundle.  I also discussed risk of wrist/hand stiffness, incomplete symptom resolution especially given the severity of the electrodiagnostic study findings, and chronic pain.  All the patient's questions were answered to her satisfaction.  Therefore, plan for left open carpal tunnel release and left ulnar nerve decompression of the hand/wrist.  [facility]. Monitored anesthesia care with local.  Request medical clearance for surgery.    Pennie Banter. Romeo Apple, MD  Assistant Professor of Clinical Orthopaedics  Division of Hand, Wrist, & Upper Extremity  [facility]   Office: [phone]    This note was dictated using speech recognition software.  A thorough attempt was made to proof read and correct any errors.

## 2020-07-01 ENCOUNTER — Encounter: Payer: Self-pay | Admitting: Anesthesiology

## 2020-07-01 NOTE — Op Note (Addendum)
[facility]   [place] [facility] [place], [place]  [address]     Name:  Kylie Parker, Kylie Parker                    MR#: [id number]             Account #:    [id number]     DOB:   2046/03/06                         Admit Status: REG SDC    Admit Date:   07/01/20  Location: [facility]     Room/Bed:            Report #: [id number]      Disch Date:             Family Physician: Call MD,Andrew                                                                       OPERATIVE REPORT     DATE OF PROCEDURE:  07/01/2020     PREOPERATIVE DIAGNOSIS:    1. Left carpal tunnel syndrome.  2. Left ulnar nerve entrapment in the hand/wrist.      POSTOPERATIVE DIAGNOSIS:    1. Left carpal tunnel syndrome.  2. Left ulnar nerve entrapment in the hand/wrist.      PROCEDURE PERFORMED:    1. Left open carpal tunnel release.  2. Left ulnar nerve release of the hand/wrist.      ASSISTANT:  Della Goo, NP.      ANESTHESIA:  MAC with local.      FLUIDS:  Crystalloid.      BLOOD LOSS:  5 cc.      COMPLICATIONS:  None.      TOURNIQUET TIME:  47 minutes.      INDICATIONS:  Kylie Parker is a 74 year old female with history of left hand  paresthesias including all 5 fingers.  Gradually worsening over several years.  She has worn a wrist brace without relief.  She had nerve studies done  demonstrating moderate carpal tunnel syndrome and ulnar nerve compression at  the hand/wrist.  Given persistence of symptoms despite conservative care, she  is indicated for surgery.      I discussed the risks, benefits, alternatives of surgery including risk of  infection, damage to local structures such as median nerve, flexor tendons,  ulnar nerve and vessels.  I discussed risk of stiffness, wound healing  problems, chronic pain, and incomplete symptom resolution.  Her questions were  answered to her satisfaction, and informed written consent for surgery was  obtained.       PROCEDURE IN DETAIL:  The patient was identified in  the preoperative holding  area.  Her left wrist was signed with indelible ink by myself.  She was   brought  to the operating room, placed on the OR table in supine position.  Monitored  anesthesia care was done.  No antibiotics given.  A well-padded tourniquet was  placed high in her left upper brachium.  The left upper extremity was prepped  with 2 times ChloraPrep sticks and draped in the typical sterile fashion.  Operative time-out was done identifying the correct patient, identifiers,   site,  side, and surgery.                       [place], [address] [place], [place], [place]  [address]     Name:  Shuntay, Everetts                    MR#: [id number]             Account #:    [id number]        Procedure was commenced with a local block to the volar  wrist and palm with 20 cc of a 50/50 mixture of 1% plain lidocaine with 0.5%  plain bupivacaine.  Thereafter esmarch exsanguination was done, and the   tourniquet was  inflated to 250 mmHg.  I made a curved incision in the palm from the proximal  palmar crease traveling toward the wrist along the ulnar aspect of the 4th   ray.   At the wrist, there was a Erick Blinks and then a proximal extent up the wrist   about 2 cm.  I carefully dissected through soft tissues and used bipolar  as needed.  Proximally, I identified the antebrachial fascia.  This was   released  out toward the radial aspect of the pisiform bone.  I then crossed the wrist  joint.  There was a very thick distal extent of the fascia, which was fully  released.  I then identified the ulnar neurovascular bundle, which was   protected.  I  found the ulnar nerve and carefully dissected this free, including the  superficial sensory branch and then I found the deep motor branch.  I then  dissected the fascia between the pisiform and hamate to release the deep motor  branch of the ulnar nerve, which was well decompressed.  I used bipolar as  needed to coagulate  the muscle fibers.      Then through the same incision I traveled slightly radial.  I identified the  transverse carpal ligament on the radial aspect of the hamate bone.  And  then carefully, full-thickness, I released the transverse carpal ligament,  identifying the carpal canal.  There was a small amount of synovitis which was  sharply debrided.  Otherwise, there was complete decompression of the median  nerve as well as complete decompression of the ulnar nerve to include sensory  and motor branches.      Hemostasis was achieved with bipolar.  The wound was then copiously irrigated  with normal saline.  The skin was closed with 4-0 nylon suture.  Dressing was  placed with Xeroform, fluffs, Webril, and a plaster splint.  The tourniquet   was  deflated at 47 minutes.  The fingers pinked up immediately with brisk   capillary  refill.  The patient was awoken from anesthesia and brought to the PACU in   good and  stable condition.  All sponge and needle counts  were correct.  I was present  for and performed the procedure myself.      PLAN:  The patient will be discharged home when she meets appropriate   criteria.   Recommend elevation and finger motion.  She may take over-the-counter pain   medication.  Plan to re-evaluate in the office in 10-14 days' time.      Burgess Estelle, MD     MO/MODL  DD:  07/01/2020 12:24:11  DT:  07/01/2020 12:41:45  Job #:  [id number]                      Electronically Signed   Finalized By:     Girtha Rm. Candis Shine MD [facility])          <Signature on [place]   Date:07/06/20                                                                                                                                                                                          Dictated by: Girtha Rm. Candis Shine MD [facility])                           Dictated   Date:07/01/20  Transcriptionist:                                                 Counsellor.   Date:07/01/20  CC:

## 2020-07-01 NOTE — Provider Consult (Addendum)
[facility] [place], [place]  [id number]     Name:  Katora, Fini                    MR#: [id number]             Account #:    [id number]     DOB:   Dec 16, 2045                         Admit Status: REG SDC    Admit Date:   07/01/20  Location: [facility]     Room/Bed:            Report #: [id number]      Disch Date:             Family Physician: Call MD,Andrew                                                                         CONSULTATION     DATE OF SERVICE:  07/01/2020     PREOPERATIVE DIAGNOSIS:    1. Left carpal tunnel syndrome.  2. Left ulnar nerve compression.      PROPOSED OPERATIVE PROCEDURE:    1. Open left carpal tunnel release.  2. Release of ulnar nerve at the left wrist/hand.      HISTORY OF PRESENT ILLNESS:  The patient is a 74 year old female, comes in  today for surgical intervention.      ALLERGIES:  STATINS EQUALS RASH.      MEDICATION:    1. Metoprolol.  2. Lisinopril/hydrochlorothiazide.  3. Isosorbide.  4. Combivent.  5. Xanax.  6. Zetia.  7. Lyrica.  8. Aspirin.  9. Zofran.      PAST MEDICAL HISTORY:    1. Hypertension.  2. History of coronary artery disease.  The patient has had no anginal   symptoms  in over 5 years.   3. Hyperlipidemia.  4. Smoker.  5. COPD, mild.  No hospitalizations or ER visits.  6. ADHD.  7. History of bipolar disorder.  8. Fibromyalgia.      PAST SURGERY HISTORY:    1. Tonsillectomy.  2. Colon resection.      ANESTHESIA HISTORY:  No personal or family history of complications due to  anesthetics, American Society of Anesthesiologists class 3.      LABORATORY DATA:  EKG normal sinus rhythm.  COVID test negative.      PHYSICAL EXAMINATION:  DENTAL EXAMINATION:  Adequate.   AIRWAY EXAMINATION:  Mallampati class I.                     [place], [place]  [place], [place], [place]  [id number]     Name:  Tashai, Catino                    MR#:  [id number]             Account #:    [id number]        HEART:  Regular rate and rhythm.  No obvious murmurs or gallops auscultated.   LUNGS:  Clear to auscultation bilaterally.      Remainder of physical exam deferred as per history and physical by Dr.  Candis Shine.      ANESTHETIC PLAN:  Preoperative anesthetic plan discussed in detail with the  patient.  Risks, benefits of monitored anesthetic were discussed.  The patient  fully understood, wished to proceed.  N.p.o. status was confirmed as of  midnight.      PROCEDURE NOTE:  The patient taken to the operating room at 11:04 a.m.  Appropriate monitors attached.  Patient did receive 5 mg IV midazolam for  preoperative anxiolysis.  At this time, oxygen via nasal cannula 2  liters/minute initiated and a propofol infusion 250 mcg/kg per minute was also  begun.  The patient was spontaneously ventilating throughout the procedure and  tolerated the procedure well.  The patient did receive 10 mg IV bolus of  propofol.      At termination of the procedure, the propofol infusion was discontinued,  patient when awake responded to commands, spontaneously ventilating as she had  been throughout the case.  Taken directly back to the ambulatory surgical care  center where upon arrival, vital signs stable, doing well, resting   comfortably,  discharged once appropriate criteria met.      INTAKE/OUTPUT:    1. Estimated blood loss:  Nil.   2. IV fluid:  1000 mL of lactated Ringer.      Royal Hawthorn, MD     IM/MODL  DD:  07/01/2020 12:20:26  DT:  07/01/2020 13:16:56  Job #:  [id number]                                              Electronically Signed   Finalized By:     Royal Hawthorn MD                      <Signature on Sherald Barge   Date:07/06/20  Dictated by: Royal Hawthorn MD                                        Dictated   Date:07/01/20  Transcriptionist:                                                 Transcr.   Date:07/01/20  CC:

## 2020-07-07 ENCOUNTER — Other Ambulatory Visit: Payer: Self-pay

## 2020-07-07 ENCOUNTER — Ambulatory Visit (INDEPENDENT_AMBULATORY_CARE_PROVIDER_SITE_OTHER): Payer: Medicare Other

## 2020-07-07 ENCOUNTER — Encounter: Payer: Self-pay | Admitting: Family Medicine

## 2020-07-07 DIAGNOSIS — Z23 Encounter for immunization: Secondary | ICD-10-CM

## 2020-07-13 ENCOUNTER — Ambulatory Visit: Payer: Medicare (Managed Care) | Admitting: Orthopedic Surgery

## 2020-07-13 ENCOUNTER — Encounter: Payer: Self-pay | Admitting: Orthopedic Surgery

## 2020-07-13 DIAGNOSIS — G5622 Lesion of ulnar nerve, left upper limb: Secondary | ICD-10-CM

## 2020-07-13 DIAGNOSIS — G5603 Carpal tunnel syndrome, bilateral upper limbs: Secondary | ICD-10-CM

## 2020-07-13 NOTE — Progress Notes (Signed)
Orthopedic Surgery Post-operative Note    CC:  status-post left open carpal tunnel release with Guyon canal release    HPI: Kylie Parker is a 74 y.o. female who presents 2 week(s) status-post left open carpal tunnel release with Guyon canal release. Patient denies fevers, chills, nausea, vomiting.  Minimal if any pain since surgery.  She notes the first day or 2 she had some numbness in the ring and small fingers that has since resolved.  Now she denies any numbness or tingling in the fingers and is very pleased with the surgical outcome.  She does have intermittent symptoms in her right side but she is currently not interested in any further management for her right wrist/hand.    Exam:  Constitutional: No acute distress, alert, oriented  MSK:  left upper extremity   Skin intact, volar wrist and palm incision healing well, no signs of infection, sutures removed and stockinette applied.  Warm and well perfused  Baseline motor and sensory exams  Patient is able to make a composite fist    Assessment: Kylie Parker is a 74 y.o. female who presents 2 week(s) status-post left open carpal tunnel release with Guyon canal release    Plan: The patient is doing very well 2 weeks out from her left open carpal tunnel release with Guyon canal release.  Wound is healing appropriately with no signs of infection.  She reports resolution of her preoperative symptoms.  I encouraged her to gradually increase use of the hand and wrist to her tolerance.  She should avoid contaminated water for the next couple of days but she may shower.  She has intermittent symptoms on her right side with very similar electrodiagnostic study findings, however, for the time being she opts to observe the right side.  She understands progression of paresthesias could indicate nerve damage and she will monitor for this.  Questions elicited and answered.  May follow-up as needed.    Return if symptoms worsen or fail to improve.    Pennie Banter. Romeo Apple,  MD  Assistant Professor of Clinical Orthopaedics  Division of Hand, Wrist, & Upper Extremity  [facility]   Office: [phone]    This note was dictated using speech recognition software.  A thorough attempt was made to proof read and correct any errors.

## 2020-07-15 DIAGNOSIS — Z1211 Encounter for screening for malignant neoplasm of colon: Secondary | ICD-10-CM | POA: Diagnosis not present

## 2020-07-15 DIAGNOSIS — Z01818 Encounter for other preprocedural examination: Secondary | ICD-10-CM | POA: Diagnosis not present

## 2020-07-27 ENCOUNTER — Other Ambulatory Visit: Payer: Self-pay

## 2020-07-27 DIAGNOSIS — Z4502 Encounter for adjustment and management of automatic implantable cardiac defibrillator: Secondary | ICD-10-CM | POA: Diagnosis not present

## 2020-07-27 DIAGNOSIS — Z95810 Presence of automatic (implantable) cardiac defibrillator: Secondary | ICD-10-CM | POA: Diagnosis not present

## 2020-07-27 DIAGNOSIS — I472 Ventricular tachycardia: Secondary | ICD-10-CM | POA: Diagnosis not present

## 2020-07-27 DIAGNOSIS — I420 Dilated cardiomyopathy: Secondary | ICD-10-CM | POA: Diagnosis not present

## 2020-07-27 DIAGNOSIS — I10 Essential (primary) hypertension: Secondary | ICD-10-CM | POA: Diagnosis not present

## 2020-07-27 DIAGNOSIS — Z8674 Personal history of sudden cardiac arrest: Secondary | ICD-10-CM | POA: Diagnosis not present

## 2020-07-27 MED ORDER — ROSUVASTATIN CALCIUM 10 MG PO TABS
10.0000 mg | ORAL_TABLET | Freq: Every day | ORAL | 1 refills | Status: DC
Start: 2020-07-27 — End: 2020-11-23

## 2020-08-10 DIAGNOSIS — I509 Heart failure, unspecified: Secondary | ICD-10-CM | POA: Diagnosis not present

## 2020-08-10 DIAGNOSIS — T82111A Breakdown (mechanical) of cardiac pulse generator (battery), initial encounter: Secondary | ICD-10-CM | POA: Diagnosis not present

## 2020-08-10 DIAGNOSIS — Z4502 Encounter for adjustment and management of automatic implantable cardiac defibrillator: Secondary | ICD-10-CM | POA: Diagnosis not present

## 2020-08-10 DIAGNOSIS — I110 Hypertensive heart disease with heart failure: Secondary | ICD-10-CM | POA: Diagnosis not present

## 2020-08-10 DIAGNOSIS — I420 Dilated cardiomyopathy: Secondary | ICD-10-CM | POA: Diagnosis not present

## 2020-08-10 DIAGNOSIS — I472 Ventricular tachycardia: Secondary | ICD-10-CM | POA: Diagnosis not present

## 2020-08-12 ENCOUNTER — Other Ambulatory Visit: Payer: Self-pay | Admitting: Physician Assistant

## 2020-08-12 DIAGNOSIS — I5022 Chronic systolic (congestive) heart failure: Secondary | ICD-10-CM

## 2020-08-24 ENCOUNTER — Telehealth: Payer: Self-pay

## 2020-08-24 NOTE — Telephone Encounter (Signed)
LM with Mr. Roat requesting for Bobbiejo to call us back. We need to RS her AWV to a later date. Her last AWV was 10/03/2019. I have canceled the appointment for 09/22/2020 for the AWV being that it is too soon. If pt calls back or if we call. Please RS the appointment after 10/02/2020. Thank you

## 2020-08-26 ENCOUNTER — Telehealth: Payer: Medicare Other

## 2020-08-26 NOTE — Chronic Care Management (AMB) (Deleted)
Chronic Care Management Pharmacy  Name: Debra Mann  MRN: [id number] DOB: Dec 26, 1945  Chief Complaint/ HPI  Debra Mann,  74 y.o. , female presents for their Initial CCM visit with the clinical pharmacist via telephone due to COVID-19 Pandemic.  PCP : [name], MD  Their chronic conditions include: CHF NYHA Class 1 - systolic, Atrial flutter, HTN, Hypothyroidism, Osteopenia, HLD, Pain management.  Office Visits:   07/07/2020 - flu shot given.  05/21/2020 - recommend chair exercises to increase strength.   Consult Visit: 08/10/2020 - dual chamber generator change out.   Medications: Outpatient Encounter Medications as of 08/26/2020  Medication Sig  . acetaminophen (TYLENOL) 500 MG tablet Take 500 mg by mouth in the morning and at bedtime.  (Patient not taking: Reported on 05/21/2020)  . aspirin 81 MG EC tablet Take 81 mg by mouth daily.   . Calcium Carbonate-Vitamin D (CALCIUM 500/D) 500-125 MG-UNIT TABS Take 1 tablet by mouth daily.   . carvedilol (COREG) 12.5 MG tablet Take 1 tablet (12.5 mg total) by mouth 2 (two) times daily with a meal.  . furosemide (LASIX) 20 MG tablet Take 20 mg by mouth daily as needed.   Marland Kitchen levothyroxine (SYNTHROID) 88 MCG tablet Take 1 tablet (88 mcg total) by mouth daily.  Marland Kitchen losartan (COZAAR) 50 MG tablet TAKE 1 TABLET(50 MG) BY MOUTH DAILY  . meloxicam (MOBIC) 15 MG tablet Take by mouth.  . Multiple Vitamin (MULTIVITAMIN) tablet Take 1 tablet by mouth daily.  . Multiple Vitamins-Minerals (EYE VITAMINS PO) Take 1 tablet by mouth daily.  . Multiple Vitamins-Minerals (GNP HEALTHY EYES SUPERVISION PO) Take by mouth.  . Omega-3 1000 MG CAPS Take 1 capsule by mouth daily.   . rosuvastatin (CRESTOR) 10 MG tablet Take 1 tablet (10 mg total) by mouth daily.   Facility-Administered Encounter Medications as of 08/26/2020  Medication  . triamcinolone acetonide (KENALOG-40) injection 80 mg   Allergies  Allergen Reactions  . Atorvastatin    SDOH  Screenings   Alcohol Screen:   . Last Alcohol Screening Score (AUDIT): Not on file  Depression (PHQ2-9): Low Risk   . PHQ-2 Score: 0  Financial Resource Strain:   . Difficulty of Paying Living Expenses: Not on file  Food Insecurity: No Food Insecurity  . Worried About Charity fundraiser in the Last Year: Never true  . Ran Out of Food in the Last Year: Never true  Housing: Low Risk   . Last Housing Risk Score: 0  Physical Activity:   . Days of Exercise per Week: Not on file  . Minutes of Exercise per Session: Not on file  Social Connections:   . Frequency of Communication with Friends and Family: Not on file  . Frequency of Social Gatherings with Friends and Family: Not on file  . Attends Religious Services: Not on file  . Active Member of Clubs or Organizations: Not on file  . Attends Archivist Meetings: Not on file  . Marital Status: Not on file  Stress:   . Feeling of Stress : Not on file  Tobacco Use: Low Risk   . Smoking Tobacco Use: Never Smoker  . Smokeless Tobacco Use: Never Used  Transportation Needs: No Transportation Needs  . Lack of Transportation (Medical): No  . Lack of Transportation (Non-Medical): No      Current Diagnosis/Assessment:  Goals Addressed   None     Heart Failure   Type: Systolic  Last ejection fraction: 35-40% NYHA Class:  I (no actitivty limitation)   BP today is:  <130/80  Office blood pressures are  BP Readings from Last 3 Encounters:  11/19/19 128/78  12/31/18 (!) 183/91    Patient checks BP at home infrequently  Patient home BP readings are ranging: 120/70 mmHg  Patient has failed these meds in past: hydralazine  Patient is currently controlled on the following medications: carvedilol 12.5 mg twice daily, furosemide 20 mg daily prn,  losartan 50 mg daily  We discussed diet and exercise extensively. Patient limits salt intake. Patient does not do formal exercise. She indicates that she is not having issues  with swelling. Patient walks to mailbox and does housework for exercise.   Plan  Continue current medications  and   Hypothyroidism   TSH  Date Value Ref Range Status  05/21/2020 0.789 0.450 - 4.500 uIU/mL Final     Patient has failed these meds in past: n/a Patient is currently controlled on the following medications: levothyroxine 88 mcg  We discussed:  Takes appropriately before all other food and medications.   Plan  Continue current medications   Hyperlipidemia   Lipid Panel     Component Value Date/Time   CHOL 156 05/21/2020 0754   TRIG 70 05/21/2020 0754   HDL 58 05/21/2020 0754   CHOLHDL 2.7 05/21/2020 0754   LDLCALC 84 05/21/2020 0754   LABVLDL 14 05/21/2020 0754     The 10-year ASCVD risk score Mikey Bussing DC Jr., et al., 2013) is: 11.8%   Values used to calculate the score:     Age: 29 years     Sex: Female     Is Non-Hispanic African American: No     Diabetic: No     Tobacco smoker: No     Systolic Blood Pressure: 161 mmHg     Is BP treated: Yes     HDL Cholesterol: 58 mg/dL     Total Cholesterol: 156 mg/dL   Patient has failed these meds in past: atorvastatin  Patient is currently uncontrolled on the following medications: rosuvastatin 10 mg qhs, omega-3 1000 mg bid.   We discussed:  diet and exercise extensively. Patient eats "whatever she wants and mostly what she shouldn't". Enjoys sandwiches, chicken, potatoes, potato salad, carrot raisin salad, deviled eggs, salads peas and carrots. Cooks main meals on Saturdays and Sundays. Recommended low-fat diet. Cholesterol medication was increased from 5 mg to 10 mg daily in October.   Plan  Continue current medications   Pain   Patient has failed these meds in past: not reported Patient is currently controlled on the following medications: tylenol 500 mg q6h prn, meloxicam 15 mg daily  We discussed:  Patient is doing well with pain. She describes it as discomfort at this point. She has recovered well  from back surgery. Patient mentioned that her knee is bothering her and wanted to talk about [facility] referral. Educated that [place] therapy is not always covered by insurance and Dr.[name] could give her a kenalog shot in her knee to help. Patient is scheduling an appointment with Dr. Tobie Poet.   Plan  Continue current medications  Osteopenia / Osteoporosis   Last DEXA Scan: 10/22/2018   T-Score femoral neck: -1.6  10-year probability of major osteoporotic fracture: 25.3%  10-year probability of hip fracture: 7.4%  Patient is a candidate for pharmacologic treatment due to T-Score -1.0 to -2.5 and 10-year risk of hip fracture > 3%  Patient has failed these meds in past: n/a Patient is currently uncontrolled on  the following medications: Calcium carbonate with D - 500-125 mg daily  We discussed:  Recommend 1200 mg of calcium daily from dietary and supplemental sources.Patient has a history of a broken hip and elevated FRAX score. She currently takes Calcium with D daily but is willing to increase to twice daily with food. We talked about adding a bisphosphonate but patient declined at this time due to concern of potential side effects. She would like to see what increasing Calcium with D to twice daily does on her next Dexa scan January 2022. Pharmacist counseled on the importance of weight bearing exercise for bone health. Patient indicates that she walks quite a bit but could do more.   Plan  Increase Calcium with Vitamin D to twice daily and recheck Bone Mineral Density in January 2022.    Health Maintenance   Patient is currently controlled on the following medications:   Eye supplement Daily - eye vision  We discussed:  Patient was taking a double dose of the eye support vitamin in attempts of approving vision. Have counseled patient to resume taking at recommended doses due to daily recommendation of the fat soluble vitamins in supplement.   Plan  Continue current  medications  Vaccines   Reviewed and discussed patient's vaccination history.  COVID vaccine Moderna both administered at [facility] on 12/16/2019 and 01/13/2020.   Immunization History  Administered Date(s) Administered  . Fluad Quad(high Dose 65+) 07/07/2020  . Influenza, High Dose Seasonal PF 06/06/2016, 07/03/2017  . Influenza, Seasonal, Injecte, Preservative Fre 11/18/2014  . Influenza-Unspecified 08/17/2013, 08/01/2018, 07/26/2019  . Moderna SARS-COVID-2 Vaccination 12/16/2019, 01/13/2020  . Pneumococcal Conjugate-13 03/17/2016  . Pneumococcal Polysaccharide-23 10/22/2012    Plan  Recommended patient receive annual flu vaccine in office.   Medication Management   Pt uses [facility] pharmacy for all medications Uses pill box? No - she takes them out as needed.  Pt endorses 100% compliance  We discussed: Patient would rather take them out individually so she doesn't get confused.   Plan  Continue current medication management strategy    Follow up: 3 month phone visit

## 2020-08-27 DIAGNOSIS — I472 Ventricular tachycardia: Secondary | ICD-10-CM | POA: Diagnosis not present

## 2020-08-27 DIAGNOSIS — Z4502 Encounter for adjustment and management of automatic implantable cardiac defibrillator: Secondary | ICD-10-CM | POA: Diagnosis not present

## 2020-09-04 ENCOUNTER — Telehealth: Payer: Self-pay

## 2020-09-04 NOTE — Chronic Care Management (AMB) (Signed)
Chronic Care Management Pharmacy Assistant   Name: Debra Mann  MRN: [id number] DOB: 06-Oct-1946  Reason for Encounter: Medication Review   PCP : [name], MD  Allergies:   Allergies  Allergen Reactions  . Atorvastatin     Medications: Outpatient Encounter Medications as of 09/04/2020  Medication Sig  . acetaminophen (TYLENOL) 500 MG tablet Take 500 mg by mouth in the morning and at bedtime.  (Patient not taking: Reported on 05/21/2020)  . aspirin 81 MG EC tablet Take 81 mg by mouth daily.   . Calcium Carbonate-Vitamin D (CALCIUM 500/D) 500-125 MG-UNIT TABS Take 1 tablet by mouth daily.   . carvedilol (COREG) 12.5 MG tablet Take 1 tablet (12.5 mg total) by mouth 2 (two) times daily with a meal.  . furosemide (LASIX) 20 MG tablet Take 20 mg by mouth daily as needed.   Marland Kitchen levothyroxine (SYNTHROID) 88 MCG tablet Take 1 tablet (88 mcg total) by mouth daily.  Marland Kitchen losartan (COZAAR) 50 MG tablet TAKE 1 TABLET(50 MG) BY MOUTH DAILY  . meloxicam (MOBIC) 15 MG tablet Take by mouth.  . Multiple Vitamin (MULTIVITAMIN) tablet Take 1 tablet by mouth daily.  . Multiple Vitamins-Minerals (EYE VITAMINS PO) Take 1 tablet by mouth daily.  . Multiple Vitamins-Minerals (GNP HEALTHY EYES SUPERVISION PO) Take by mouth.  . Omega-3 1000 MG CAPS Take 1 capsule by mouth daily.   . rosuvastatin (CRESTOR) 10 MG tablet Take 1 tablet (10 mg total) by mouth daily.   Facility-Administered Encounter Medications as of 09/04/2020  Medication  . triamcinolone acetonide (KENALOG-40) injection 80 mg    Current Diagnosis: Patient Active Problem List   Diagnosis Date Noted  . BMI 26.0-26.9,adult 05/21/2020  . Abdominal distension 05/21/2020  . Acute laryngopharyngitis 04/09/2020  . Benign hypertension with coincident congestive heart failure ([place]) 02/18/2020  . Hyperlipidemia   . Thyroid disease   . Osteopenia of spine 11/19/2019  . Mixed hyperlipidemia 11/19/2019  . Congestive heart failure, NYHA class  1, chronic, systolic ([place]) 86/57/8469  . Anoxic brain injury ([place]) 10/26/2019  . Vision disturbance 10/26/2019  . Other forms of angina pectoris ([place]) 09/13/2019  . Vision loss, bilateral 09/11/2019  . Essential hypertension 09/10/2019  . History of cardiac arrest 09/10/2019  . Pain management 09/10/2019  . Status post chest tube placement ([place]) 09/10/2019  . Hypothyroidism 09/10/2019  . Hyponatremia 05/13/2019  . Postoperative pain 05/10/2019  . S/P spinal fusion 05/10/2019  . Impaired mobility and ADLs 05/10/2019  . Scoliosis deformity of spine 05/08/2019  . Non-rheumatic mitral regurgitation 01/18/2016  . Orthostatic hypotension 01/18/2016  . Paroxysmal VT ([place]) 01/18/2016  . Atrial flutter with rapid ventricular response ([place]) 11/19/2015  . Nonischemic dilated cardiomyopathy ([place]) 11/19/2015  . ICD (implantable cardioverter-defibrillator), dual, in situ 11/19/2015     Follow-Up:  Patient Assistance Coordination - Reviewed chart and adherence measures. Per [employer], total gaps - all measures are (4) . Total gaps - star measures (0) 1. Controlling high blood pressure >140 not met. 2. Colorectal cancer screening not met 3. Wellness bundle not preformed 4. Annual wellness not preformed.  Elly Modena Owens Shark, [place], Notified  Judithann Sheen, [facility] Clinical Pharmacist Assistant [phone]

## 2020-09-07 ENCOUNTER — Ambulatory Visit: Payer: Medicare (Managed Care) | Admitting: Orthopedic Surgery

## 2020-09-07 ENCOUNTER — Encounter: Payer: Self-pay | Admitting: Orthopedic Surgery

## 2020-09-07 VITALS — BP 186/86 | HR 88 | Temp 97.2°F | Resp 18 | Ht 62.0 in | Wt 142.0 lb

## 2020-09-07 DIAGNOSIS — G5603 Carpal tunnel syndrome, bilateral upper limbs: Secondary | ICD-10-CM

## 2020-09-07 DIAGNOSIS — G5622 Lesion of ulnar nerve, left upper limb: Secondary | ICD-10-CM

## 2020-09-07 NOTE — Progress Notes (Signed)
Orthopedic Surgery Post-operative Note    CC:  status-post left open carpal tunnel release with Guyon canal release    HPI: Kylie Parker is a 74 y.o. female who presents 2.5 months status-post left open carpal tunnel release with Guyon canal release. She reports resolution of the hand paresthesias. Denies fevers or chills. She had some discomfort at the surgical scar region with touch. She was told by her PCP to come back for reassessment. Denies any formal hand therapy.    Exam:  Constitutional: No acute distress, alert, oriented  MSK:  left upper extremity   Skin intact, volar wrist and palm incision is well-healed. Slight purplish discoloration. Scar is tender with some firmness.  No evidence of infection.   Warm and well perfused  Baseline motor and sensory exams  Patient is able to make a composite fist  Near full wrist range of motion    Assessment: Kylie Parker is a 74 y.o. female who presents 2.5 months status-post left open carpal tunnel release with Guyon canal release    Plan: The patient is now approaching 2.16-month status post left open carpal tunnel release with Guyon canal release. The surgical incision is well-healed. She has some tenderness about the scar and some firmness to it. I discussed the option of formal therapy and at first she was very hesitant at this option. I counseled the patient therapy can be very helpful to help with desensitization of the scar as well as to teach her a home program to include scar modality options such as massage with lotion and potential silicone sheet use. After conversation she was very interested in a one-time visit to hand therapy and a prescription has been placed. I do encourage her to work on scar modalities in the interim and also discussed alternating textures and vibratory therapy. I counseled the patient she has 6 total months post-op for scar modulation and I believe the scar will heal well overall. Questions elicited and answered. She may follow-up  as needed.    Return if symptoms worsen or fail to improve.    Pennie Banter. Romeo Apple, MD  Assistant Professor of Clinical Orthopaedics  Division of Hand, Wrist, & Upper Extremity  [facility] [place] [facility]   Office: [phone]    This note was dictated using speech recognition software.  A thorough attempt was made to proof read and correct any errors.

## 2020-09-14 ENCOUNTER — Other Ambulatory Visit: Payer: Self-pay

## 2020-09-14 ENCOUNTER — Encounter: Payer: Self-pay | Admitting: Nurse Practitioner

## 2020-09-14 ENCOUNTER — Telehealth (INDEPENDENT_AMBULATORY_CARE_PROVIDER_SITE_OTHER): Payer: Medicare Other | Admitting: Nurse Practitioner

## 2020-09-14 VITALS — BP 126/76 | Temp 97.9°F | Ht 65.0 in | Wt 158.0 lb

## 2020-09-14 DIAGNOSIS — Z01818 Encounter for other preprocedural examination: Secondary | ICD-10-CM | POA: Diagnosis not present

## 2020-09-14 DIAGNOSIS — J069 Acute upper respiratory infection, unspecified: Secondary | ICD-10-CM

## 2020-09-14 DIAGNOSIS — Z95810 Presence of automatic (implantable) cardiac defibrillator: Secondary | ICD-10-CM

## 2020-09-14 DIAGNOSIS — Z8674 Personal history of sudden cardiac arrest: Secondary | ICD-10-CM

## 2020-09-14 DIAGNOSIS — I420 Dilated cardiomyopathy: Secondary | ICD-10-CM

## 2020-09-14 DIAGNOSIS — I110 Hypertensive heart disease with heart failure: Secondary | ICD-10-CM

## 2020-09-14 NOTE — Progress Notes (Signed)
Virtual Visit via Telephone Note   This visit type was conducted due to national recommendations for restrictions regarding the COVID-19 Pandemic (e.g. social distancing) in an effort to limit this patient's exposure and mitigate transmission in our community.  Due to her co-morbid illnesses, this patient is at least at moderate risk for complications without adequate follow up.  This format is felt to be most appropriate for this patient at this time.  The patient did not have access to video technology/had technical difficulties with video requiring transitioning to audio format only (telephone).  All issues noted in this document were discussed and addressed.  No physical exam could be performed with this format.  Patient verbally consented to a telehealth visit.   Date:  09/14/2020   ID:  Debra Mann, DOB 03-24-1946, MRN [id number]  Patient Location: Home Provider Location: Office/Clinic  PCP:  [name], MD   Evaluation Performed:  Established patient, acute visit  Chief Complaint:  cough  History of Present Illness:    Debra Mann is a 74 y.o. female with cough for two weeks. She denies fever, chills, body aches, or rhinorrhea. Treatment includes shots of [place] whiskey which has not improved symptoms. Aggravating factors include cool outdoor air. Pt states cough has improved today, denies interfering with sleep or productive sputum. She has received flu and COVID-19 immunizations. She states she has chronic dyspnea with exertion since full cardiac arrest in November of 2020. She has an implanted Herbalist since 2004. She states spouse has recently recovered from an upper respiratory infection.   The patient does have symptoms concerning for COVID-19 infection (fever, chills, cough, or new shortness of breath).    Past Medical History:  Diagnosis Date  . Anoxic brain damage, not elsewhere classified ([place])   . Cardiac arrest, cause unspecified ([employer])   .  Hyperlipidemia   . Nonrheumatic mitral (valve) insufficiency   . Presence of automatic (implantable) cardiac defibrillator   . Spinal stenosis, lumbar region without neurogenic claudication   . Thyroid disease   . Unilateral primary osteoarthritis, left knee   . Unqualified visual loss of both eyes     Past Surgical History:  Procedure Laterality Date  . ABDOMINAL HYSTERECTOMY    . APPENDECTOMY  1991  . LUMBAR FUSION  04/2019  . PACEMAKER INSERTION  2004, 2013   2014 replaced  lead wires;with biventricular defibrillator: pericardial sac infusion  . REVISION TOTAL HIP ARTHROPLASTY Left 03/2011   blood transfusion 03/2011  . TONSILLECTOMY    . TOTAL VAGINAL HYSTERECTOMY  1991    Family History  Problem Relation Age of Onset  . Diabetes Mother   . Transient ischemic attack Father     Social History   Socioeconomic History  . Marital status: Married    Spouse name: Not on file  . Number of children: 1  . Years of education: Not on file  . Highest education level: Not on file  Occupational History  . Not on file  Tobacco Use  . Smoking status: Never Smoker  . Smokeless tobacco: Never Used  Vaping Use  . Vaping Use: Never used  Substance and Sexual Activity  . Alcohol use: Never  . Drug use: Never  . Sexual activity: Not on file  Other Topics Concern  . Not on file  Social History Narrative  . Not on file   Social Determinants of Health   Financial Resource Strain:   . Difficulty of Paying Living Expenses: Not on  file  Food Insecurity: No Food Insecurity  . Worried About Charity fundraiser in the Last Year: Never true  . Ran Out of Food in the Last Year: Never true  Transportation Needs: No Transportation Needs  . Lack of Transportation (Medical): No  . Lack of Transportation (Non-Medical): No  Physical Activity:   . Days of Exercise per Week: Not on file  . Minutes of Exercise per Session: Not on file  Stress:   . Feeling of Stress : Not on file  Social  Connections:   . Frequency of Communication with Friends and Family: Not on file  . Frequency of Social Gatherings with Friends and Family: Not on file  . Attends Religious Services: Not on file  . Active Member of Clubs or Organizations: Not on file  . Attends Archivist Meetings: Not on file  . Marital Status: Not on file  Intimate Partner Violence:   . Fear of Current or Ex-Partner: Not on file  . Emotionally Abused: Not on file  . Physically Abused: Not on file  . Sexually Abused: Not on file    Outpatient Medications Prior to Visit  Medication Sig Dispense Refill  . acetaminophen (TYLENOL) 500 MG tablet Take 500 mg by mouth in the morning and at bedtime.  (Patient not taking: Reported on 05/21/2020)    . aspirin 81 MG EC tablet Take 81 mg by mouth daily.     . Calcium Carbonate-Vitamin D (CALCIUM 500/D) 500-125 MG-UNIT TABS Take 1 tablet by mouth daily.     . carvedilol (COREG) 12.5 MG tablet Take 1 tablet (12.5 mg total) by mouth 2 (two) times daily with a meal. 180 tablet 0  . furosemide (LASIX) 20 MG tablet Take 20 mg by mouth daily as needed.     Marland Kitchen glucosamine-chondroitin 500-400 MG tablet Take 1 tablet by mouth 2 (two) times daily.    Marland Kitchen levothyroxine (SYNTHROID) 88 MCG tablet Take 1 tablet (88 mcg total) by mouth daily. 30 tablet 2  . losartan (COZAAR) 50 MG tablet TAKE 1 TABLET(50 MG) BY MOUTH DAILY 90 tablet 0  . meloxicam (MOBIC) 15 MG tablet Take by mouth.    . Multiple Vitamin (MULTIVITAMIN) tablet Take 1 tablet by mouth daily.    . Multiple Vitamins-Minerals (EYE VITAMINS PO) Take 1 tablet by mouth daily.    . Multiple Vitamins-Minerals (GNP HEALTHY EYES SUPERVISION PO) Take by mouth.    . Multiple Vitamins-Minerals (GNP HEALTHY EYES SUPERVISION PO) Take by mouth.    . mupirocin ointment (BACTROBAN) 2 % 2 (two) times daily.    . Omega-3 1000 MG CAPS Take 1 capsule by mouth daily.     . rosuvastatin (CRESTOR) 10 MG tablet Take 1 tablet (10 mg total) by mouth  daily. 90 tablet 1   Facility-Administered Medications Prior to Visit  Medication Dose Route Frequency Provider Last Rate Last Admin  . triamcinolone acetonide (KENALOG-40) injection 80 mg  80 mg Intra-articular Once [name], Elnita Maxwell, MD        Allergies:   Atorvastatin   Social History   Tobacco Use  . Smoking status: Never Smoker  . Smokeless tobacco: Never Used  Vaping Use  . Vaping Use: Never used  Substance Use Topics  . Alcohol use: Never  . Drug use: Never     Review of Systems  Constitutional: Positive for malaise/fatigue. Negative for chills and fever.  HENT: Positive for congestion. Negative for ear pain and sore throat.   Respiratory: Positive for  cough. Negative for wheezing.   Cardiovascular: Negative for chest pain.  Gastrointestinal: Negative for abdominal pain, constipation, diarrhea, heartburn, nausea and vomiting.  Genitourinary: Negative for frequency and urgency.  Musculoskeletal: Negative for joint pain and myalgias.  Neurological: Negative for headaches.     Labs/Other Tests and Data Reviewed:    Recent Labs: 05/21/2020: ALT 13; BUN 15; Creatinine, Ser 0.85; Hemoglobin 12.8; Platelets 288; Potassium 4.7; Sodium 137; TSH 0.789   Recent Lipid Panel Lab Results  Component Value Date/Time   CHOL 156 05/21/2020 07:54 AM   TRIG 70 05/21/2020 07:54 AM   HDL 58 05/21/2020 07:54 AM   CHOLHDL 2.7 05/21/2020 07:54 AM   LDLCALC 84 05/21/2020 07:54 AM    Wt Readings from Last 3 Encounters:  09/14/20 158 lb (71.7 kg)  05/21/20 158 lb (71.7 kg)  03/06/20 160 lb 6.4 oz (72.8 kg)     Objective:    Vital Signs:  BP 126/76   Temp 97.9 F (36.6 C)   Ht 5\' 5"  (1.651 m)   Wt 158 lb (71.7 kg)   BMI 26.29 kg/m    Physical Exam No physical exam performed due to telemed visit  ASSESSMENT & PLAN:    1. Viral upper respiratory tract infection -Rest and push fluids -Mucinex OTC for cough -Call office if symptoms fail to improve or worsen -Weigh daily to  monitor symptoms of CHF, notify doctor for increases of 2 pounds or more  COVID-19 Education: The signs and symptoms of COVID-19 were discussed with the patient and how to seek care for testing (follow up with PCP or arrange E-visit). The importance of social distancing was discussed today.   I spent 5 minutes dedicated to the care of this patient on the date of this encounter.  Follow Up:  In Person prn  Signed,  Jerrell Belfast, DNP 09/14/2020 [facility]

## 2020-09-22 ENCOUNTER — Ambulatory Visit: Payer: Medicare Other

## 2020-09-27 ENCOUNTER — Encounter: Payer: Self-pay | Admitting: Family Medicine

## 2020-09-27 NOTE — Progress Notes (Unsigned)
a 

## 2020-10-04 NOTE — Progress Notes (Signed)
No show

## 2020-10-05 ENCOUNTER — Ambulatory Visit (INDEPENDENT_AMBULATORY_CARE_PROVIDER_SITE_OTHER): Payer: Medicare Other | Admitting: Family Medicine

## 2020-10-05 DIAGNOSIS — E039 Hypothyroidism, unspecified: Secondary | ICD-10-CM

## 2020-10-05 DIAGNOSIS — Z0000 Encounter for general adult medical examination without abnormal findings: Secondary | ICD-10-CM

## 2020-10-05 DIAGNOSIS — I110 Hypertensive heart disease with heart failure: Secondary | ICD-10-CM

## 2020-10-05 DIAGNOSIS — E782 Mixed hyperlipidemia: Secondary | ICD-10-CM

## 2020-10-07 DIAGNOSIS — Z20828 Contact with and (suspected) exposure to other viral communicable diseases: Secondary | ICD-10-CM | POA: Diagnosis not present

## 2020-10-07 DIAGNOSIS — Z1159 Encounter for screening for other viral diseases: Secondary | ICD-10-CM | POA: Diagnosis not present

## 2020-10-12 DIAGNOSIS — Z1159 Encounter for screening for other viral diseases: Secondary | ICD-10-CM | POA: Diagnosis not present

## 2020-10-12 DIAGNOSIS — Z20828 Contact with and (suspected) exposure to other viral communicable diseases: Secondary | ICD-10-CM | POA: Diagnosis not present

## 2020-10-13 DIAGNOSIS — Z1211 Encounter for screening for malignant neoplasm of colon: Secondary | ICD-10-CM | POA: Diagnosis not present

## 2020-10-13 LAB — HM COLONOSCOPY

## 2020-10-21 ENCOUNTER — Ambulatory Visit: Payer: Medicare Other

## 2020-10-28 ENCOUNTER — Ambulatory Visit: Payer: Medicare Other

## 2020-10-28 ENCOUNTER — Ambulatory Visit (INDEPENDENT_AMBULATORY_CARE_PROVIDER_SITE_OTHER): Payer: Medicare Other | Admitting: Family Medicine

## 2020-10-28 ENCOUNTER — Other Ambulatory Visit: Payer: Self-pay

## 2020-10-28 ENCOUNTER — Encounter: Payer: Self-pay | Admitting: Family Medicine

## 2020-10-28 VITALS — BP 132/74 | HR 69 | Ht 65.0 in | Wt 165.6 lb

## 2020-10-28 DIAGNOSIS — Z0000 Encounter for general adult medical examination without abnormal findings: Secondary | ICD-10-CM | POA: Diagnosis not present

## 2020-10-28 DIAGNOSIS — Z780 Asymptomatic menopausal state: Secondary | ICD-10-CM

## 2020-10-28 DIAGNOSIS — Z1231 Encounter for screening mammogram for malignant neoplasm of breast: Secondary | ICD-10-CM | POA: Diagnosis not present

## 2020-10-28 DIAGNOSIS — Z13820 Encounter for screening for osteoporosis: Secondary | ICD-10-CM

## 2020-10-28 NOTE — Progress Notes (Signed)
Subjective:    Debra Mann is a 75 y.o. female who presents for Medicare Annual/Subsequent preventive examination.  Preventive Screening-Counseling & Management  Tobacco Social History   Tobacco Use  Smoking Status Never Smoker  Smokeless Tobacco Never Used     Problems Prior to Visit 1. CHF-h/o atrial flutter, nonischemic dilated cardiomyopathy Pacemaker changed in 2021 2. Diminished eye sight 3. Hypothyroid 4. Hyperlipidemia 5. Osteopenia 6. Anoxic brain injury from cardiac arrest Current Problems (verified) Patient Active Problem List   Diagnosis Date Noted  . BMI 26.0-26.9,adult 05/21/2020  . Abdominal distension 05/21/2020  . Acute laryngopharyngitis 04/09/2020  . Benign hypertension with coincident congestive heart failure ([place]) 02/18/2020  . Hyperlipidemia   . Thyroid disease   . Osteopenia of spine 11/19/2019  . Mixed hyperlipidemia 11/19/2019  . Congestive heart failure, NYHA class 1, chronic, systolic ([place]) 84/69/6295  . Anoxic brain injury ([place]) 10/26/2019  . Vision disturbance 10/26/2019  . Other forms of angina pectoris ([place]) 09/13/2019  . Vision loss, bilateral 09/11/2019  . Essential hypertension 09/10/2019  . History of cardiac arrest 09/10/2019  . Pain management 09/10/2019  . Status post chest tube placement ([place]) 09/10/2019  . Hypothyroidism 09/10/2019  . Hyponatremia 05/13/2019  . Postoperative pain 05/10/2019  . S/P spinal fusion 05/10/2019  . Impaired mobility and ADLs 05/10/2019  . Scoliosis deformity of spine 05/08/2019  . Non-rheumatic mitral regurgitation 01/18/2016  . Orthostatic hypotension 01/18/2016  . Paroxysmal VT ([place]) 01/18/2016  . Atrial flutter with rapid ventricular response ([place]) 11/19/2015  . Nonischemic dilated cardiomyopathy ([place]) 11/19/2015  . ICD (implantable cardioverter-defibrillator), dual, in situ 11/19/2015    Medications Prior to Visit Current Outpatient Medications on File Prior to Visit  Medication Sig  Dispense Refill  . acetaminophen (TYLENOL) 500 MG tablet Take 500 mg by mouth in the morning and at bedtime.    Marland Kitchen aspirin 81 MG EC tablet Take 81 mg by mouth daily.     . Calcium Carbonate-Vitamin D 500-125 MG-UNIT TABS Take 1 tablet by mouth daily.     . carvedilol (COREG) 12.5 MG tablet Take 1 tablet (12.5 mg total) by mouth 2 (two) times daily with a meal. 180 tablet 0  . furosemide (LASIX) 20 MG tablet Take 20 mg by mouth daily as needed.     Marland Kitchen glucosamine-chondroitin 500-400 MG tablet Take 1 tablet by mouth 2 (two) times daily.    Marland Kitchen levothyroxine (SYNTHROID) 88 MCG tablet Take 1 tablet (88 mcg total) by mouth daily. 30 tablet 2  . losartan (COZAAR) 50 MG tablet TAKE 1 TABLET(50 MG) BY MOUTH DAILY 90 tablet 0  . Multiple Vitamin (MULTIVITAMIN) tablet Take 1 tablet by mouth daily.    . Multiple Vitamins-Minerals (EYE VITAMINS PO) Take 1 tablet by mouth daily.    . Multiple Vitamins-Minerals (GNP HEALTHY EYES SUPERVISION PO) Take by mouth.    . mupirocin ointment (BACTROBAN) 2 % 2 (two) times daily.    . Omega-3 1000 MG CAPS Take 1 capsule by mouth daily.     . rosuvastatin (CRESTOR) 10 MG tablet Take 1 tablet (10 mg total) by mouth daily. 90 tablet 1   Current Facility-Administered Medications on File Prior to Visit  Medication Dose Route Frequency Provider Last Rate Last Admin  . triamcinolone acetonide (KENALOG-40) injection 80 mg  80 mg Intra-articular Once [name], Kirsten, MD        Current Medications (verified) Current Outpatient Medications  Medication Sig Dispense Refill  . acetaminophen (TYLENOL) 500 MG tablet Take 500  mg by mouth in the morning and at bedtime.    Marland Kitchen aspirin 81 MG EC tablet Take 81 mg by mouth daily.     . Calcium Carbonate-Vitamin D 500-125 MG-UNIT TABS Take 1 tablet by mouth daily.     . carvedilol (COREG) 12.5 MG tablet Take 1 tablet (12.5 mg total) by mouth 2 (two) times daily with a meal. 180 tablet 0  . furosemide (LASIX) 20 MG tablet Take 20 mg by mouth  daily as needed.     Marland Kitchen glucosamine-chondroitin 500-400 MG tablet Take 1 tablet by mouth 2 (two) times daily.    Marland Kitchen levothyroxine (SYNTHROID) 88 MCG tablet Take 1 tablet (88 mcg total) by mouth daily. 30 tablet 2  . losartan (COZAAR) 50 MG tablet TAKE 1 TABLET(50 MG) BY MOUTH DAILY 90 tablet 0  . Multiple Vitamin (MULTIVITAMIN) tablet Take 1 tablet by mouth daily.    . Multiple Vitamins-Minerals (EYE VITAMINS PO) Take 1 tablet by mouth daily.    . Multiple Vitamins-Minerals (GNP HEALTHY EYES SUPERVISION PO) Take by mouth.    . mupirocin ointment (BACTROBAN) 2 % 2 (two) times daily.    . Omega-3 1000 MG CAPS Take 1 capsule by mouth daily.     . rosuvastatin (CRESTOR) 10 MG tablet Take 1 tablet (10 mg total) by mouth daily. 90 tablet 1   Current Facility-Administered Medications  Medication Dose Route Frequency Provider Last Rate Last Admin  . triamcinolone acetonide (KENALOG-40) injection 80 mg  80 mg Intra-articular Once [name], Kirsten, MD         Allergies (verified) Atorvastatin   PAST HISTORY  Family History Family History  Problem Relation Age of Onset  . Diabetes Mother   . Transient ischemic attack Father     Social History Social History   Tobacco Use  . Smoking status: Never Smoker  . Smokeless tobacco: Never Used  Substance Use Topics  . Alcohol use: Never     Are there smokers in your home (other than you)? No-husband quit 15years ago-has COPD  Risk Factors Current exercise habits: housework Dietary issues discussed: eat at home  Cardiac risk factors: CHF-pacemaker-a flutter  Depression Screen 0  Activities of Daily Living In your present state of health, do you have any difficulty performing the following activities?:  Driving? No concerns-limit eyesight-drive during the day Managing money?  Able to manage but husband the bookkeeper Feeding yourself?yes Getting from bed to chair? Difficulty standing due to knees-sits in lift chair Climbing a flight of  stairs? Difficulty with stairclimbing-no basement- step into living room, handicap ramp on the back Preparing food and eating?: pt has not difficulty Bathing or showering? Show-grab bar Getting dressed: pt uses help to get on socks Getting to the toilet? No concern-handicap-with handles Using the toilet:no concerns Moving around from place to place: no concerns In the past year have you fallen or had a near fall?:occasional falls -over dogs  One partner Hearing Difficulties: no concerns Cognitive Testing   Advanced Directives have been discussed with the patient? 0  List the Names of Other Physician/Practitioners you currently use: 1.  [place] History  Administered Date(s) Administered  . Fluad Quad(high Dose 65+) 07/07/2020  . Influenza Split 08/17/2013, 11/18/2014, 08/01/2018, 07/26/2019  . Influenza, High Dose Seasonal PF 06/06/2016, 07/03/2017, 07/07/2020  . Influenza, Seasonal, Injecte, Preservative Fre 11/18/2014  . Influenza-Unspecified 08/17/2013, 08/01/2018, 07/26/2019  . Moderna Sars-Covid-2 Vaccination 12/16/2019, 01/13/2020  . Pneumococcal Conjugate-13 03/17/2016  . Pneumococcal Polysaccharide-23 10/22/2012  Screening Tests Health Maintenance  Topic Date Due  . Hepatitis C Screening  Never done  . TETANUS/TDAP  Never done  . COVID-19 Vaccine (3 - Booster for Moderna series) 07/15/2020  . MAMMOGRAM  12/02/2021  . COLONOSCOPY (Pts 45-62yrs Insurance coverage will need to be confirmed)  10/13/2030  . INFLUENZA VACCINE  Completed  . DEXA SCAN  Completed  . PNA vac Low Risk Adult  Completed    All answers were reviewed with the patient and necessary referrals were made:  Mertha Baars, MD   10/28/2020    Objective:     Vision by Snellen chart: right eye 2050, left 20/63, bilat 20/50 :Body mass index is 27.56 kg/m. BP 132/74   Pulse 69   Ht 5\' 5"  (1.651 m)   Wt 165 lb 9.6 oz (75.1 kg)   SpO2 98%   BMI 27.56 kg/m        Assessment:  1. Encounter for Medicare annual wellness exam 2/22 due mammogram and DEXA Visual changes after cardiovascular event-pt does not drive at night or [place] the interstate    Plan:     During the course of the visit the patient was educated and counseled about appropriate screening and preventive services including:   Patient Instructions (the written plan) was given to the patient.  Medicare Attestation I have personally reviewed: The patient's medical and social history Their use of alcohol, tobacco or illicit drugs Their current medications and supplements The patient's functional ability including ADLs,fall risks, home safety risks, cognitive, and hearing and visual impairment Diet and physical activities Evidence for depression or mood disorders  The patient's weight, height, BMI, and visual acuity have been recorded in the chart.  I have made referrals, counseling, and provided education to the patient based on review of the above and I have provided the patient with a written personalized care plan for preventive services.     Mertha Baars, MD   10/28/2020

## 2020-10-28 NOTE — Patient Instructions (Signed)
  Debra Mann , Thank you for taking time to come for your Medicare Wellness Visit. I appreciate your ongoing commitment to your health goals. Please review the following plan we discussed and let me know if I can assist you in the future.   These are the goals we discussed: mammogram/DEXA Encouraged booster for COVID vaccine  Goals    . Pharmacy Care Plan     CARE PLAN ENTRY  Current Barriers:  . Chronic Disease Management support, education, and care coordination needs related to Hypertension and Hyperlipidemia   Hypertension . Pharmacist Clinical Goal(s): o Over the next 90 days, patient will work with PharmD and providers to maintain BP goal <130/80 . Current regimen:  o Carvedilol 12.5 mg bid, Furosemide 20 mg daily prn, Losartan 50 mg daily . Interventions: o Recommend patient begin checking blood pressure weekly.  . Patient self care activities - Over the next 90 days, patient will: o Check BP 90, document, and provide at future appointments o Ensure daily salt intake < 2300 mg/day  Hyperlipidemia . Pharmacist Clinical Goal(s): o Over the next 90 days, patient will work with PharmD and providers to achieve LDL goal < 100 mg/dL . Current regimen:  o Rosuvastatin 10 mg daily . Interventions: o Recommend patient consume a low-fat diet to improve cholesterol levels.  . Patient self care activities - Over the next 90 days, patient will: o Work with pharmacist to reduce cholesterol levels by taking rosuvastatin 10 mg daily and being mindful of fat in diet.   Medication management . Pharmacist Clinical Goal(s): o Over the next 90 days, patient will work with PharmD and providers to maintain optimal medication adherence . Current pharmacy: [facility] . Interventions o Comprehensive medication review performed. o Continue current medication management strategy . Patient self care activities - Over the next 90 days, patient will: o Focus on medication adherence by continuing to  take medications as scheduled.  o Take medications as prescribed o Report any questions or concerns to PharmD and/or provider(s)  Initial goal documentation        This is a list of the screening recommended for you and due dates:  Health Maintenance  Topic Date Due  .  Hepatitis C: One time screening is recommended by Center for Disease Control  (CDC) for  adults born from [birth year] through 1964.   Never done  . Tetanus Vaccine  Never done  . COVID-19 Vaccine (3 - Booster for Moderna series) 07/15/2020  . Mammogram  12/02/2021  . Colon Cancer Screening  10/13/2030  . Flu Shot  Completed  . DEXA scan (bone density measurement)  Completed  . Pneumonia vaccines  Completed

## 2020-11-08 ENCOUNTER — Other Ambulatory Visit: Payer: Self-pay | Admitting: Family Medicine

## 2020-11-08 DIAGNOSIS — I5022 Chronic systolic (congestive) heart failure: Secondary | ICD-10-CM

## 2020-11-11 ENCOUNTER — Other Ambulatory Visit: Payer: Self-pay

## 2020-11-11 DIAGNOSIS — I5022 Chronic systolic (congestive) heart failure: Secondary | ICD-10-CM

## 2020-11-11 MED ORDER — CARVEDILOL 12.5 MG PO TABS: 12.5000 mg | ORAL_TABLET | Freq: Two times a day (BID) | ORAL | 0 refills | Status: DC

## 2020-11-11 MED ORDER — LOSARTAN POTASSIUM 50 MG PO TABS: 50.0000 mg | ORAL_TABLET | Freq: Every day | ORAL | 0 refills | Status: DC

## 2020-11-12 ENCOUNTER — Other Ambulatory Visit: Payer: Self-pay | Admitting: Family Medicine

## 2020-11-12 DIAGNOSIS — Z4502 Encounter for adjustment and management of automatic implantable cardiac defibrillator: Secondary | ICD-10-CM | POA: Diagnosis not present

## 2020-11-19 NOTE — Progress Notes (Signed)
Subjective:  Patient ID: Debra Mann, female    DOB: 07/30/1946  Age: 75 y.o. MRN: [id number]  Chief Complaint  Patient presents with  . Hypertension  . Hyperlipidemia  . Hypothyroidism    HPI HTN with CHF (systolic) Denies orthopnea. Occasionally has a brief episode of chest pain which radiates into throat and lasts 2-3 minutes. No triggers. Not time to take anything. Resolves with rest. Happens 1-2 times per day. -Coreg, Losartan. Patient has defibrillator.  Hypothyroidism -Synthroid daily. Last tsh was at goal . Hyperlipidemia - Crestor 10 mg once daily at night, Fish Oil one capsules daily.  Current Outpatient Medications on File Prior to Visit  Medication Sig Dispense Refill  . acetaminophen (TYLENOL) 500 MG tablet Take 500 mg by mouth in the morning and at bedtime.    Marland Kitchen aspirin 81 MG EC tablet Take 81 mg by mouth daily.     . Calcium Carbonate-Vitamin D 500-125 MG-UNIT TABS Take 1 tablet by mouth daily.     . carvedilol (COREG) 12.5 MG tablet TAKE 1 TABLET BY MOUTH TWICE DAILY WITH FOOD 180 tablet 0  . furosemide (LASIX) 20 MG tablet Take 20 mg by mouth daily as needed.     Marland Kitchen glucosamine-chondroitin 500-400 MG tablet Take 1 tablet by mouth 2 (two) times daily.    Marland Kitchen levothyroxine (SYNTHROID) 88 MCG tablet Take 1 tablet (88 mcg total) by mouth daily. 30 tablet 2  . losartan (COZAAR) 50 MG tablet Take 1 tablet (50 mg total) by mouth daily. 90 tablet 0  . Multiple Vitamin (MULTIVITAMIN) tablet Take 1 tablet by mouth daily.    . Multiple Vitamins-Minerals (EYE VITAMINS PO) Take 1 tablet by mouth daily.    . Omega-3 1000 MG CAPS Take 1 capsule by mouth daily.     . rosuvastatin (CRESTOR) 10 MG tablet Take 1 tablet (10 mg total) by mouth daily. 90 tablet 1   Current Facility-Administered Medications on File Prior to Visit  Medication Dose Route Frequency Provider Last Rate Last Admin  . triamcinolone acetonide (KENALOG-40) injection 80 mg  80 mg Intra-articular Once [name],  MD       Past Medical History:  Diagnosis Date  . Anoxic brain damage, not elsewhere classified ([place])   . Cardiac arrest, cause unspecified ([place])   . Hyperlipidemia   . Nonrheumatic mitral (valve) insufficiency   . Presence of automatic (implantable) cardiac defibrillator   . Spinal stenosis, lumbar region without neurogenic claudication   . Thyroid disease   . Unilateral primary osteoarthritis, left knee   . Unqualified visual loss of both eyes    Past Surgical History:  Procedure Laterality Date  . ABDOMINAL HYSTERECTOMY    . APPENDECTOMY  1991  . LUMBAR FUSION  04/2019  . PACEMAKER INSERTION  2004, 2013   2014 replaced  lead wires;with biventricular defibrillator: pericardial sac infusion  . REVISION TOTAL HIP ARTHROPLASTY Left 03/2011   blood transfusion 03/2011  . TONSILLECTOMY    . TOTAL VAGINAL HYSTERECTOMY  1991    Family History  Problem Relation Age of Onset  . Diabetes Mother   . Transient ischemic attack Father    Social History   Socioeconomic History  . Marital status: Married    Spouse name: Not on file  . Number of children: 1  . Years of education: Not on file  . Highest education level: Not on file  Occupational History  . Not on file  Tobacco Use  . Smoking status: Never Smoker  .  Smokeless tobacco: Never Used  Vaping Use  . Vaping Use: Never used  Substance and Sexual Activity  . Alcohol use: Never  . Drug use: Never  . Sexual activity: Not on file  Other Topics Concern  . Not on file  Social History Narrative  . Not on file   Social Determinants of Health   Financial Resource Strain: Not on file  Food Insecurity: No Food Insecurity  . Worried About Charity fundraiser in the Last Year: Never true  . Ran Out of Food in the Last Year: Never true  Transportation Needs: No Transportation Needs  . Lack of Transportation (Medical): No  . Lack of Transportation (Non-Medical): No  Physical Activity: Not on file  Stress: Not on file   Social Connections: Not on file    Review of Systems  Constitutional: Negative for chills, fatigue and fever.  HENT: Negative for congestion, ear pain, rhinorrhea and sore throat.   Respiratory: Positive for shortness of breath. Negative for cough.   Cardiovascular: Negative for chest pain.  Gastrointestinal: Negative for abdominal pain, constipation, diarrhea, nausea and vomiting.  Endocrine: Negative for polydipsia and polyphagia.  Genitourinary: Negative for dysuria and urgency.  Musculoskeletal: Positive for arthralgias and back pain. Negative for myalgias.  Neurological: Negative for dizziness, weakness and headaches.  Psychiatric/Behavioral: Negative for dysphoric mood. The patient is not nervous/anxious.      Objective:  BP (!) 142/82   Pulse 68   Temp (!) 97.5 F (36.4 C)   Resp 18   Ht 5\' 5"  (1.651 m)   Wt 164 lb (74.4 kg)   BMI 27.29 kg/m   BP/Weight 11/20/2020 10/28/2020 71/24/5809  Systolic BP 983 382 505  Diastolic BP 82 74 76  Wt. (Lbs) 164 165.6 158  BMI 27.29 27.56 26.29    Physical Exam Vitals reviewed.  Constitutional:      Appearance: Normal appearance.  Neck:     Vascular: No carotid bruit.  Cardiovascular:     Rate and Rhythm: Normal rate and regular rhythm.     Heart sounds: Normal heart sounds.  Pulmonary:     Effort: Pulmonary effort is normal.     Breath sounds: Normal breath sounds.  Abdominal:     General: Bowel sounds are normal.     Tenderness: There is no abdominal tenderness.  Musculoskeletal:        General: No swelling.  Neurological:     Mental Status: She is alert.  Psychiatric:        Mood and Affect: Mood normal.        Behavior: Behavior normal.     Diabetic Foot Exam - Simple   No data filed      Lab Results  Component Value Date   WBC 6.1 11/20/2020   HGB 13.1 11/20/2020   HCT 39.6 11/20/2020   PLT 286 11/20/2020   GLUCOSE 99 11/20/2020   CHOL 161 11/20/2020   TRIG 81 11/20/2020   HDL 55 11/20/2020    LDLCALC 91 11/20/2020   ALT 14 11/20/2020   AST 20 11/20/2020   NA 137 11/20/2020   K 4.7 11/20/2020   CL 101 11/20/2020   CREATININE 0.75 11/20/2020   BUN 17 11/20/2020   CO2 25 11/20/2020   TSH 0.932 11/20/2020      Assessment & Plan:   1. Mixed hyperlipidemia Well controlled.  No changes to medicines.  Continue to work on eating a healthy diet and exercise.  Labs drawn today.  -  Lipid panel  2. Acquired hypothyroidism - TSH  3. Hypertensive heart disease with chronic systolic congestive heart failure ([place]) Not quite at goal. Consider increase to losartan.  No changes to medicines.  Continue to work on eating a healthy diet and exercise.  Labs drawn today.  - Comprehensive metabolic panel - CBC with Differential/Platelet  4. Typical atrial flutter ([facility])  In NSR. Has defibrillator.  Continue with cardiology follow ups.    Orders Placed This Encounter  Procedures  . Lipid panel  . Comprehensive metabolic panel  . CBC with Differential/Platelet  . TSH  . Cardiovascular Risk Assessment     Follow-up: Return in about 6 months (around 05/20/2021).  An After Visit Summary was printed and given to the patient.  [name], MD [facility] [phone]

## 2020-11-20 ENCOUNTER — Encounter: Payer: Self-pay | Admitting: Family Medicine

## 2020-11-20 ENCOUNTER — Other Ambulatory Visit: Payer: Self-pay

## 2020-11-20 ENCOUNTER — Ambulatory Visit (INDEPENDENT_AMBULATORY_CARE_PROVIDER_SITE_OTHER): Payer: Medicare Other | Admitting: Family Medicine

## 2020-11-20 VITALS — BP 142/82 | HR 68 | Temp 97.5°F | Resp 18 | Ht 65.0 in | Wt 164.0 lb

## 2020-11-20 DIAGNOSIS — E782 Mixed hyperlipidemia: Secondary | ICD-10-CM

## 2020-11-20 DIAGNOSIS — I483 Typical atrial flutter: Secondary | ICD-10-CM | POA: Diagnosis not present

## 2020-11-20 DIAGNOSIS — E039 Hypothyroidism, unspecified: Secondary | ICD-10-CM | POA: Diagnosis not present

## 2020-11-20 DIAGNOSIS — I5022 Chronic systolic (congestive) heart failure: Secondary | ICD-10-CM | POA: Diagnosis not present

## 2020-11-20 DIAGNOSIS — I110 Hypertensive heart disease with heart failure: Secondary | ICD-10-CM | POA: Diagnosis not present

## 2020-11-21 LAB — CBC WITH DIFFERENTIAL/PLATELET
Basophils Absolute: 0.1 10*3/uL (ref 0.0–0.2)
Basos: 1 %
EOS (ABSOLUTE): 0.2 10*3/uL (ref 0.0–0.4)
Eos: 4 %
Hematocrit: 39.6 % (ref 34.0–46.6)
Hemoglobin: 13.1 g/dL (ref 11.1–15.9)
Immature Grans (Abs): 0 10*3/uL (ref 0.0–0.1)
Immature Granulocytes: 0 %
Lymphocytes Absolute: 1.7 10*3/uL (ref 0.7–3.1)
Lymphs: 28 %
MCH: 30.3 pg (ref 26.6–33.0)
MCHC: 33.1 g/dL (ref 31.5–35.7)
MCV: 92 fL (ref 79–97)
Monocytes Absolute: 1 10*3/uL — ABNORMAL HIGH (ref 0.1–0.9)
Monocytes: 16 %
Neutrophils Absolute: 3.1 10*3/uL (ref 1.4–7.0)
Neutrophils: 51 %
Platelets: 286 10*3/uL (ref 150–450)
RBC: 4.32 x10E6/uL (ref 3.77–5.28)
RDW: 12.7 % (ref 11.7–15.4)
WBC: 6.1 10*3/uL (ref 3.4–10.8)

## 2020-11-21 LAB — LIPID PANEL
Chol/HDL Ratio: 2.9 ratio (ref 0.0–4.4)
Cholesterol, Total: 161 mg/dL (ref 100–199)
HDL: 55 mg/dL (ref >39)
LDL Chol Calc (NIH): 91 mg/dL (ref 0–99)
Triglycerides: 81 mg/dL (ref 0–149)
VLDL Cholesterol Cal: 15 mg/dL (ref 5–40)

## 2020-11-21 LAB — COMPREHENSIVE METABOLIC PANEL
ALT: 14 IU/L (ref 0–32)
AST: 20 IU/L (ref 0–40)
Albumin/Globulin Ratio: 2 (ref 1.2–2.2)
Albumin: 4.4 g/dL (ref 3.7–4.7)
Alkaline Phosphatase: 74 IU/L (ref 44–121)
BUN/Creatinine Ratio: 23 (ref 12–28)
BUN: 17 mg/dL (ref 8–27)
Bilirubin Total: 0.3 mg/dL (ref 0.0–1.2)
CO2: 25 mmol/L (ref 20–29)
Calcium: 9.7 mg/dL (ref 8.7–10.3)
Chloride: 101 mmol/L (ref 96–106)
Creatinine, Ser: 0.75 mg/dL (ref 0.57–1.00)
GFR calc Af Amer: 91 mL/min/{1.73_m2} (ref >59)
GFR calc non Af Amer: 79 mL/min/{1.73_m2} (ref >59)
Globulin, Total: 2.2 g/dL (ref 1.5–4.5)
Glucose: 99 mg/dL (ref 65–99)
Potassium: 4.7 mmol/L (ref 3.5–5.2)
Sodium: 137 mmol/L (ref 134–144)
Total Protein: 6.6 g/dL (ref 6.0–8.5)

## 2020-11-21 LAB — CARDIOVASCULAR RISK ASSESSMENT

## 2020-11-21 LAB — TSH: TSH: 0.932 u[IU]/mL (ref 0.450–4.500)

## 2020-11-23 ENCOUNTER — Other Ambulatory Visit: Payer: Self-pay

## 2020-11-23 MED ORDER — ROSUVASTATIN CALCIUM 20 MG PO TABS: 20.0000 mg | ORAL_TABLET | Freq: Every day | ORAL | 0 refills | Status: DC

## 2020-12-01 DIAGNOSIS — I10 Essential (primary) hypertension: Secondary | ICD-10-CM | POA: Diagnosis not present

## 2020-12-01 DIAGNOSIS — I340 Nonrheumatic mitral (valve) insufficiency: Secondary | ICD-10-CM | POA: Diagnosis not present

## 2020-12-01 DIAGNOSIS — I472 Ventricular tachycardia: Secondary | ICD-10-CM | POA: Diagnosis not present

## 2020-12-01 DIAGNOSIS — Z95810 Presence of automatic (implantable) cardiac defibrillator: Secondary | ICD-10-CM | POA: Diagnosis not present

## 2020-12-01 DIAGNOSIS — E039 Hypothyroidism, unspecified: Secondary | ICD-10-CM | POA: Diagnosis not present

## 2020-12-01 DIAGNOSIS — Z8674 Personal history of sudden cardiac arrest: Secondary | ICD-10-CM | POA: Diagnosis not present

## 2020-12-01 DIAGNOSIS — I208 Other forms of angina pectoris: Secondary | ICD-10-CM | POA: Diagnosis not present

## 2020-12-02 ENCOUNTER — Telehealth: Payer: Self-pay

## 2020-12-02 NOTE — Progress Notes (Signed)
Chronic Care Management Pharmacy Assistant   Name: DOCIA KLAR  MRN: [id number] DOB: 09/04/1946  Reason for Encounter: Disease State for hypertension  Patient Questions:  1.  Have you seen any other providers since your last visit? 11/20/20-PCP 12/01/20-Cardiologist  2.  Any changes in your medicines or health? Cardiologist is monitoring her for shortness of breath. Started Rosuvastatin 40mg    PCP : [name], MD  Allergies:   Allergies  Allergen Reactions  . Atorvastatin     Medications: Outpatient Encounter Medications as of 12/02/2020  Medication Sig  . acetaminophen (TYLENOL) 500 MG tablet Take 500 mg by mouth in the morning and at bedtime.  Marland Kitchen aspirin 81 MG EC tablet Take 81 mg by mouth daily.   . Calcium Carbonate-Vitamin D 500-125 MG-UNIT TABS Take 1 tablet by mouth daily.   . carvedilol (COREG) 12.5 MG tablet TAKE 1 TABLET BY MOUTH TWICE DAILY WITH FOOD  . furosemide (LASIX) 20 MG tablet Take 20 mg by mouth daily as needed.   Marland Kitchen glucosamine-chondroitin 500-400 MG tablet Take 1 tablet by mouth 2 (two) times daily.  Marland Kitchen levothyroxine (SYNTHROID) 88 MCG tablet Take 1 tablet (88 mcg total) by mouth daily.  Marland Kitchen losartan (COZAAR) 50 MG tablet Take 1 tablet (50 mg total) by mouth daily.  . Multiple Vitamin (MULTIVITAMIN) tablet Take 1 tablet by mouth daily.  . Multiple Vitamins-Minerals (EYE VITAMINS PO) Take 1 tablet by mouth daily.  . Omega-3 1000 MG CAPS Take 1 capsule by mouth daily.   . rosuvastatin (CRESTOR) 20 MG tablet Take 1 tablet (20 mg total) by mouth daily.   Facility-Administered Encounter Medications as of 12/02/2020  Medication  . triamcinolone acetonide (KENALOG-40) injection 80 mg    Current Diagnosis: Patient Active Problem List   Diagnosis Date Noted  . BMI 26.0-26.9,adult 05/21/2020  . Abdominal distension 05/21/2020  . Acute laryngopharyngitis 04/09/2020  . Benign hypertension with coincident congestive heart failure ([place]) 02/18/2020  .  Hyperlipidemia   . Thyroid disease   . Osteopenia of spine 11/19/2019  . Mixed hyperlipidemia 11/19/2019  . Congestive heart failure, NYHA class 1, chronic, systolic ([place]) 69/48/5462  . Anoxic brain injury ([place]) 10/26/2019  . Vision disturbance 10/26/2019  . Other forms of angina pectoris ([place]) 09/13/2019  . Vision loss, bilateral 09/11/2019  . Essential hypertension 09/10/2019  . History of cardiac arrest 09/10/2019  . Encounter for Medicare annual wellness exam 09/10/2019  . Status post chest tube placement ([place]) 09/10/2019  . Hypothyroidism 09/10/2019  . Hyponatremia 05/13/2019  . Postoperative pain 05/10/2019  . S/P spinal fusion 05/10/2019  . Impaired mobility and ADLs 05/10/2019  . Scoliosis deformity of spine 05/08/2019  . Non-rheumatic mitral regurgitation 01/18/2016  . Orthostatic hypotension 01/18/2016  . Paroxysmal VT ([place]) 01/18/2016  . Atrial flutter with rapid ventricular response ([place]) 11/19/2015  . Nonischemic dilated cardiomyopathy ([place]) 11/19/2015  . ICD (implantable cardioverter-defibrillator), dual, in situ 11/19/2015   Reviewed chart prior to disease state call. Spoke with patient regarding BP  Recent Office Vitals: BP Readings from Last 3 Encounters:  11/20/20 (!) 142/82  10/28/20 132/74  09/14/20 126/76   Pulse Readings from Last 3 Encounters:  11/20/20 68  10/28/20 69  05/21/20 76    Wt Readings from Last 3 Encounters:  11/20/20 164 lb (74.4 kg)  10/28/20 165 lb 9.6 oz (75.1 kg)  09/14/20 158 lb (71.7 kg)     Kidney Function Lab Results  Component Value Date/Time   CREATININE 0.75 11/20/2020 08:02 AM  CREATININE 0.85 05/21/2020 07:54 AM   GFRNONAA 79 11/20/2020 08:02 AM   GFRAA 91 11/20/2020 08:02 AM    BMP Latest Ref Rng & Units 11/20/2020 05/21/2020 02/18/2020  Glucose 65 - 99 mg/dL 99 89 88  BUN 8 - 27 mg/dL 17 15 17   Creatinine 0.57 - 1.00 mg/dL 0.75 0.85 0.74  BUN/Creat Ratio 12 - 28 23 18 23   Sodium 134 - 144 mmol/L 137 137 137   Potassium 3.5 - 5.2 mmol/L 4.7 4.7 4.5  Chloride 96 - 106 mmol/L 101 101 102  CO2 20 - 29 mmol/L 25 24 22   Calcium 8.7 - 10.3 mg/dL 9.7 9.9 9.4    . Current antihypertensive regimen:  Coreg Losartan  . How often are you checking your Blood Pressure? Patient not really checking it at home  . Current home BP readings: Patient stated her reading at the cardiologist office was 134/80  . What recent interventions/DTPs have been made by any provider to improve Blood Pressure control since last CPP Visit: Patient is taking her medications as directed.  . Any recent hospitalizations or ED visits since last visit with CPP? No   . What diet changes have been made to improve Blood Pressure Control?  Patient eats what she likes, she is not on a special diet.  . What exercise is being done to improve your Blood Pressure Control?   Patient has had some recent SOB, her cardiologist is monitoring that, she also stated her legs get tired, but she tries to stay active.   Adherence Review: Is the patient currently on ACE/ARB medication? Yes Does the patient have >5 day gap between last estimated fill dates? No   Follow-Up:  Pharmacist Review  Donette Larry, CPP notified  Clarita Leber, [place] Pharmacist Assistant [phone]

## 2020-12-29 ENCOUNTER — Other Ambulatory Visit: Payer: Self-pay | Admitting: Family Medicine

## 2021-01-01 DIAGNOSIS — M8589 Other specified disorders of bone density and structure, multiple sites: Secondary | ICD-10-CM | POA: Diagnosis not present

## 2021-01-01 DIAGNOSIS — M85851 Other specified disorders of bone density and structure, right thigh: Secondary | ICD-10-CM | POA: Diagnosis not present

## 2021-01-01 DIAGNOSIS — Z1231 Encounter for screening mammogram for malignant neoplasm of breast: Secondary | ICD-10-CM | POA: Diagnosis not present

## 2021-01-01 DIAGNOSIS — M810 Age-related osteoporosis without current pathological fracture: Secondary | ICD-10-CM | POA: Diagnosis not present

## 2021-01-05 ENCOUNTER — Other Ambulatory Visit: Payer: Self-pay

## 2021-01-05 DIAGNOSIS — Z780 Asymptomatic menopausal state: Secondary | ICD-10-CM

## 2021-01-05 DIAGNOSIS — Z0000 Encounter for general adult medical examination without abnormal findings: Secondary | ICD-10-CM

## 2021-01-05 DIAGNOSIS — Z13820 Encounter for screening for osteoporosis: Secondary | ICD-10-CM

## 2021-01-05 DIAGNOSIS — Z1231 Encounter for screening mammogram for malignant neoplasm of breast: Secondary | ICD-10-CM

## 2021-01-18 ENCOUNTER — Other Ambulatory Visit: Payer: Self-pay

## 2021-01-18 ENCOUNTER — Other Ambulatory Visit: Payer: Self-pay | Admitting: Family Medicine

## 2021-01-18 DIAGNOSIS — I5022 Chronic systolic (congestive) heart failure: Secondary | ICD-10-CM

## 2021-01-18 MED ORDER — LOSARTAN POTASSIUM 50 MG PO TABS: 50.0000 mg | ORAL_TABLET | Freq: Every day | ORAL | 0 refills | Status: DC

## 2021-01-18 MED ORDER — CARVEDILOL 12.5 MG PO TABS: 12.5000 mg | ORAL_TABLET | Freq: Two times a day (BID) | ORAL | 0 refills | Status: DC

## 2021-01-18 NOTE — Telephone Encounter (Signed)
Pt is also requesting gabapentin 300 mg twice a day. Not on current med list.   Debra Mann 01/18/21 4:25 PM

## 2021-01-19 DIAGNOSIS — I270 Primary pulmonary hypertension: Secondary | ICD-10-CM | POA: Diagnosis not present

## 2021-01-19 DIAGNOSIS — I083 Combined rheumatic disorders of mitral, aortic and tricuspid valves: Secondary | ICD-10-CM | POA: Diagnosis not present

## 2021-01-22 ENCOUNTER — Telehealth: Payer: Self-pay

## 2021-01-22 NOTE — Progress Notes (Signed)
Chronic Care Management Pharmacy Assistant   Name: QUENTIN STREBEL  MRN: [id number] DOB: 06/17/1946   Reason for Encounter: Disease State for hypertension  Recent office visits:  01/05/21-Bone density result 12/01/20-Dr. Akbary-cardiology-Paroxysmal ventricular tachycardia, follow up 18mos 11/20/20-Dr. Tobie Poet, PCP, blood work ordered, follow up 72mos  Recent consult visits:    Hospital visits:  08/10/20-ICD dual chamber generator replacement  Medications: Outpatient Encounter Medications as of 01/22/2021  Medication Sig  . acetaminophen (TYLENOL) 500 MG tablet Take 500 mg by mouth in the morning and at bedtime.  Marland Kitchen aspirin 81 MG EC tablet Take 81 mg by mouth daily.   . Calcium Carbonate-Vitamin D 500-125 MG-UNIT TABS Take 1 tablet by mouth daily.   . carvedilol (COREG) 12.5 MG tablet Take 1 tablet (12.5 mg total) by mouth 2 (two) times daily with a meal.  . furosemide (LASIX) 20 MG tablet Take 20 mg by mouth daily as needed.   Marland Kitchen glucosamine-chondroitin 500-400 MG tablet Take 1 tablet by mouth 2 (two) times daily.  Marland Kitchen levothyroxine (SYNTHROID) 88 MCG tablet TAKE 1 TABLET(88 MCG) BY MOUTH DAILY  . losartan (COZAAR) 50 MG tablet Take 1 tablet (50 mg total) by mouth daily.  . Multiple Vitamin (MULTIVITAMIN) tablet Take 1 tablet by mouth daily.  . Multiple Vitamins-Minerals (EYE VITAMINS PO) Take 1 tablet by mouth daily.  . Omega-3 1000 MG CAPS Take 1 capsule by mouth daily.   . rosuvastatin (CRESTOR) 20 MG tablet Take 1 tablet (20 mg total) by mouth daily.   Facility-Administered Encounter Medications as of 01/22/2021  Medication  . triamcinolone acetonide (KENALOG-40) injection 80 mg   Reviewed chart prior to disease state call. Spoke with patient regarding BP  Recent Office Vitals: BP Readings from Last 3 Encounters:  11/20/20 (!) 142/82  10/28/20 132/74  09/14/20 126/76   Pulse Readings from Last 3 Encounters:  11/20/20 68  10/28/20 69  05/21/20 76    Wt Readings from Last 3  Encounters:  11/20/20 164 lb (74.4 kg)  10/28/20 165 lb 9.6 oz (75.1 kg)  09/14/20 158 lb (71.7 kg)     Kidney Function Lab Results  Component Value Date/Time   CREATININE 0.75 11/20/2020 08:02 AM   CREATININE 0.85 05/21/2020 07:54 AM   GFRNONAA 79 11/20/2020 08:02 AM   GFRAA 91 11/20/2020 08:02 AM    BMP Latest Ref Rng & Units 11/20/2020 05/21/2020 02/18/2020  Glucose 65 - 99 mg/dL 99 89 88  BUN 8 - 27 mg/dL 17 15 17   Creatinine 0.57 - 1.00 mg/dL 0.75 0.85 0.74  BUN/Creat Ratio 12 - 28 23 18 23   Sodium 134 - 144 mmol/L 137 137 137  Potassium 3.5 - 5.2 mmol/L 4.7 4.7 4.5  Chloride 96 - 106 mmol/L 101 101 102  CO2 20 - 29 mmol/L 25 24 22   Calcium 8.7 - 10.3 mg/dL 9.7 9.9 9.4    . Current antihypertensive regimen:  Carvedilol Losartan   . How often are you checking your Blood Pressure? infrequently   . Current home BP readings: Patient did not have any recent blood pressure readings  What recent interventions/DTPs have been made by any provider to improve Blood Pressure control since last CPP Visit: Patient noted she would l like to see about decreasing Crestor, she stated her energy is down, she is also having some back/knee pain and would like some Gabapentin 300mg  she stated she was on that before.   I told her I would send Donette Larry, CPP a message.  She mentioned about the possibility of calling [employer] to see if they could help.  She takes her medication as directed.     . Any recent hospitalizations or ED visits since last visit with CPP? No     . What diet changes have been made to improve Blood Pressure Control?   Patient watches her diet, she has no added salt.   . What exercise is being done to improve your Blood Pressure Control?  Patient stated she is very active despite getting short of breath at times.  She stated she SOB has been going on for a long time.   Adherence Review: Is the patient currently on ACE/ARB medication? Yes Does the patient have >5 day  gap between last estimated fill dates? No   Clarita Leber, [place] Pharmacist Assistant [phone]

## 2021-01-23 IMAGING — CT CT LUMBAR SPINE WITH CONTRAST
1 of 6 series · 6 of 14 positions shown, 8 images · non-contrast
Comparison: None

CLINICAL DATA: Low back pain extending into the right thigh.
TECHNIQUE: Contiguous axial images were obtained through the Lumbar spine after
the intrathecal infusion of infusion. Coronal and sagittal
reconstructions were obtained of the axial image sets.

[Series 3: l spine soft · axial · 0.34mm/px · z∈[-254,-77]mm · 6 of 83 slices shown, 8 images]
[im 12/83  soft-tissue]
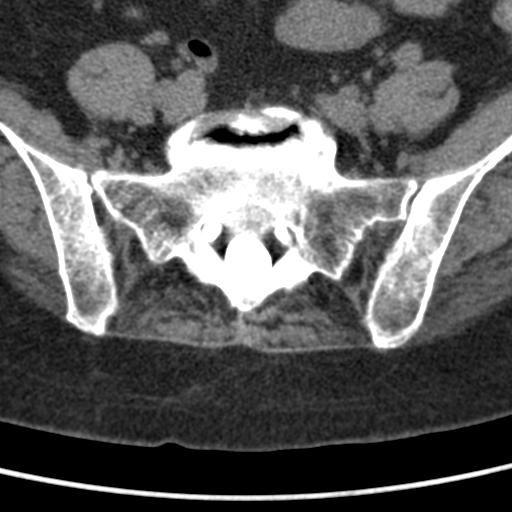
[im 12/83  bone]
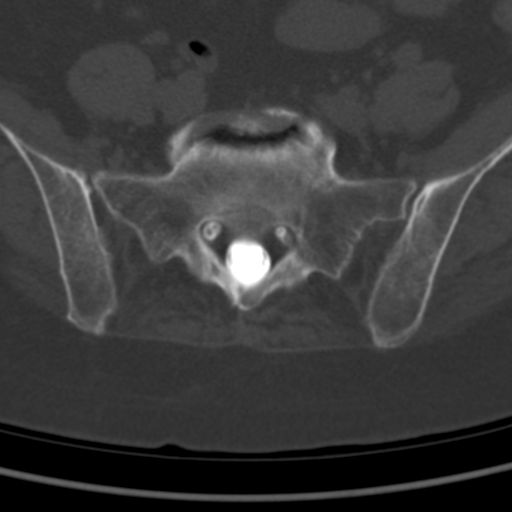
[im 24/83  bone]
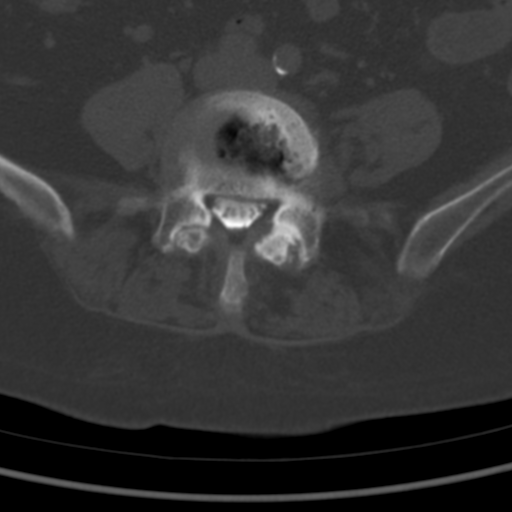
[im 36/83  bone]
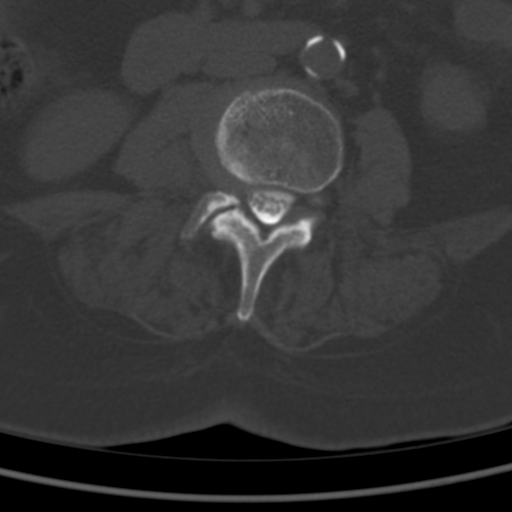
[im 47/83  bone]
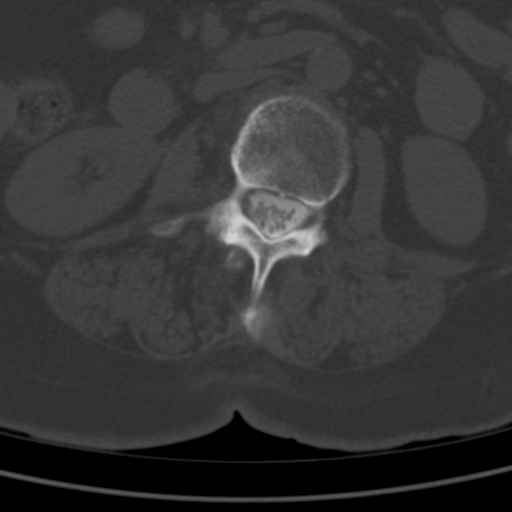
[im 59/83  soft-tissue]
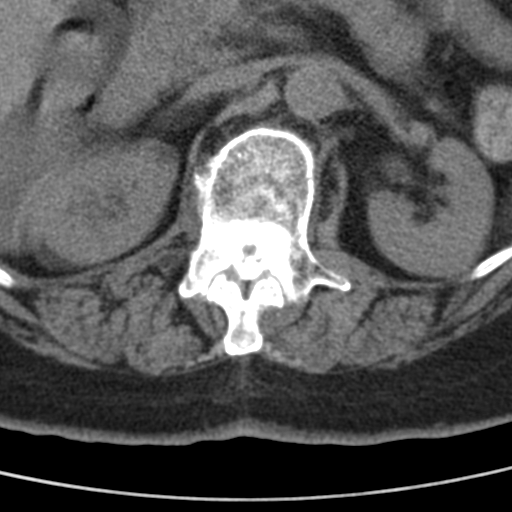
[im 59/83  bone]
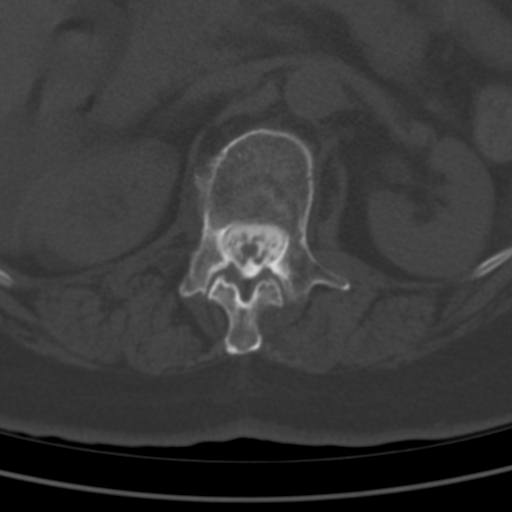
[im 71/83  bone]
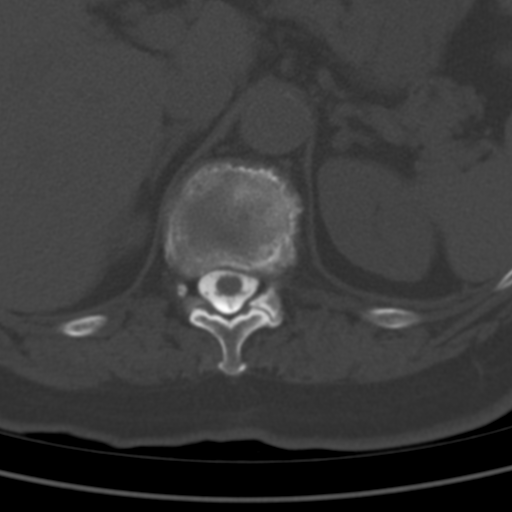

[6 of 14 positions shown; findings below may reference images not displayed]

EXAM:
LUMBAR MYELOGRAM

FLUOROSCOPY TIME:  Radiation Exposure Index (as provided by the
fluoroscopic device): 306.50 uGy*m2

Fluoroscopy Time:  21 seconds

Number of Acquired Images:  0

PROCEDURE:
After thorough discussion of risks and benefits of the procedure
including bleeding, infection, injury to nerves, blood vessels,
adjacent structures as well as headache and CSF leak, written and
oral informed consent was obtained. Consent was obtained by Dr.
Yosvany Lemons. Time out form was completed.

Patient was positioned prone on the fluoroscopy table. Local
anesthesia was provided with 1% lidocaine without epinephrine after
prepped and draped in the usual sterile fashion. Puncture was
performed at L1-2 using a 3 1/2 inch 22-gauge spinal needle via
right paramedian approach. Using a single pass through the dura, the
needle was placed within the thecal sac, with return of clear CSF.
15 mL of Isovue F-RNN was injected into the thecal sac, with normal
opacification of the nerve roots and cauda equina consistent with
free flow within the subarachnoid space.

I personally performed the lumbar puncture and administered the
intrathecal contrast. I also personally supervised acquisition of
the myelogram images.
FINDINGS: LUMBAR MYELOGRAM FINDINGS:

Five non rib-bearing lumbar type vertebral bodies are present.
Scoliosis of the lumbar spine is centered to the left at L2-3 and to
the right at L4-5. Slight retrolisthesis present at L2-3 and L3-4.
There is also slight retrolisthesis at T12-L1.

There is chronic loss of height at T12-L1, L3-4, L4-5, and L5-S1.

Moderate central canal stenosis is present at L4-5 with left greater
than right subarticular narrowing. Moderate central canal stenosis
present at L3-4 with subarticular narrowing bilaterally. Moderate
right subarticular stenosis is present at L2-3. There is mild left
subarticular narrowing at L5-S1. There is early cut off of the nerve
roots on the left at L2, L3, and L4. There is early cut off of the
nerve roots on the right at L3 and L4.

The disc protrusion at L4-5 is worse with standing. There is also
some anterolisthesis at L4-5 with standing. This is exaggerated with
flexion and reduced in extension. No other listhesis is evident with
standing, flexion, or extension.

CT LUMBAR MYELOGRAM FINDINGS:

Lumbar spine is imaged from T10-11 through S2-3. Scoliosis convex
left at L2-3 and to the right at L4-5. Conus medullaris terminates
at L1-2.

Limited imaging the abdomen demonstrates some atherosclerotic
changes in the distal aorta and proximal iliac vessels without
aneurysm. No solid organ lesions are present. There is no
significant adenopathy. Scoliosis is rotational.

L1-2: A rightward disc protrusion is present. Mild right
subarticular narrowing is present with crowding of nerve roots on
the right. Mild right foraminal narrowing is also present, likely
impacting the right L1 nerve root.

L2-3: A broad-based disc protrusion is present. Moderate facet
hypertrophy is noted bilaterally. This results in moderate right and
mild left subarticular stenosis. Moderate right and mild left
foraminal narrowing is present as well.

L3-4: A broad-based disc protrusion is present. Moderate facet
hypertrophy is noted bilaterally. Mild subarticular narrowing is
worse left than right. A far right lateral disc protrusion is
present. This results in severe right foraminal narrowing. There is
mild left foraminal narrowing.

L4-5: A broad-based disc protrusion is present. Moderate facet
hypertrophy is noted. Moderate left and mild right subarticular
narrowing is present. There is severe left foraminal stenosis. Mild
right foraminal narrowing is present.

L5-S1: A broad-based disc protrusion is present. Facet hypertrophy
is worse on the left. Spurring results in moderate left and mild
right foraminal narrowing.
IMPRESSION: 1. Severe right foraminal narrowing at L3-4. This likely impacts the
right L3 nerve root and corresponds to the patient's symptoms.
2. Scoliosis as described.
3. Mild right subarticular and foraminal narrowing at L1-2.
4. Moderate right and mild left subarticular and foraminal narrowing
at L2-3.
5. Mild subarticular narrowing at L3-4 is worse on the left.
6. Severe left foraminal narrowing at L4-5, likely impacting the
left L4 nerve root.
7. Dynamic anterolisthesis at L4-5 as described.
8. Moderate left and mild right subarticular narrowing at L4-5 is
likely worse with standing and flexion.
9. Moderate left and mild right foraminal narrowing at L5-S1 due to
facet spurring.

## 2021-01-29 DIAGNOSIS — J189 Pneumonia, unspecified organism: Secondary | ICD-10-CM | POA: Diagnosis not present

## 2021-01-29 DIAGNOSIS — R051 Acute cough: Secondary | ICD-10-CM | POA: Diagnosis not present

## 2021-01-29 DIAGNOSIS — Z20828 Contact with and (suspected) exposure to other viral communicable diseases: Secondary | ICD-10-CM | POA: Diagnosis not present

## 2021-01-29 DIAGNOSIS — R509 Fever, unspecified: Secondary | ICD-10-CM | POA: Diagnosis not present

## 2021-02-08 ENCOUNTER — Other Ambulatory Visit: Payer: Self-pay | Admitting: Family Medicine

## 2021-02-08 DIAGNOSIS — I5022 Chronic systolic (congestive) heart failure: Secondary | ICD-10-CM

## 2021-02-10 ENCOUNTER — Ambulatory Visit (INDEPENDENT_AMBULATORY_CARE_PROVIDER_SITE_OTHER): Payer: Medicare Other

## 2021-02-10 DIAGNOSIS — Z23 Encounter for immunization: Secondary | ICD-10-CM

## 2021-02-10 NOTE — Progress Notes (Signed)
   Covid-19 Vaccination Clinic  Name:  ALILAH MCMEANS    MRN: [id number] DOB: 02-May-1946  02/10/2021  Ms. Justo was observed post Covid-19 immunization for 15 minutes without incident. She was provided with Vaccine Information Sheet and instruction to access the V-Safe system.   Ms. Houser was instructed to call [phone] with any severe reactions post vaccine: Marland Kitchen Difficulty breathing  . Swelling of face and throat  . A fast heartbeat  . A bad rash all over body  . Dizziness and weakness

## 2021-02-11 DIAGNOSIS — Z95810 Presence of automatic (implantable) cardiac defibrillator: Secondary | ICD-10-CM | POA: Diagnosis not present

## 2021-03-05 ENCOUNTER — Other Ambulatory Visit: Payer: Self-pay | Admitting: Family Medicine

## 2021-03-24 ENCOUNTER — Telehealth: Payer: Self-pay

## 2021-03-24 NOTE — Progress Notes (Signed)
    Chronic Care Management Pharmacy Assistant   Name: LOUVINIA CUMBO  MRN: [id number] DOB: May 09, 1946  Debra Mann is an 75 y.o. year old female who presents for her follow-up CCM visit with the clinical pharmacist.  Reason for Encounter: General adherence call    Recent office visits:  None noted since last CCM visit  Recent consult visits:  None noted since last CCM visit  Hospital visits:  None noted since last CCM visit   Medications: Outpatient Encounter Medications as of 03/24/2021  Medication Sig  . acetaminophen (TYLENOL) 500 MG tablet Take 500 mg by mouth in the morning and at bedtime.  Marland Kitchen aspirin 81 MG EC tablet Take 81 mg by mouth daily.   . Calcium Carbonate-Vitamin D 500-125 MG-UNIT TABS Take 1 tablet by mouth daily.   . carvedilol (COREG) 12.5 MG tablet Take 1 tablet (12.5 mg total) by mouth 2 (two) times daily with a meal.  . furosemide (LASIX) 20 MG tablet Take 20 mg by mouth daily as needed.   Marland Kitchen glucosamine-chondroitin 500-400 MG tablet Take 1 tablet by mouth 2 (two) times daily.  Marland Kitchen levothyroxine (SYNTHROID) 88 MCG tablet TAKE 1 TABLET(88 MCG) BY MOUTH DAILY  . losartan (COZAAR) 50 MG tablet TAKE 1 TABLET(50 MG) BY MOUTH DAILY  . Multiple Vitamin (MULTIVITAMIN) tablet Take 1 tablet by mouth daily.  . Multiple Vitamins-Minerals (EYE VITAMINS PO) Take 1 tablet by mouth daily.  . Omega-3 1000 MG CAPS Take 1 capsule by mouth daily.   . rosuvastatin (CRESTOR) 20 MG tablet TAKE 1 TABLET(20 MG) BY MOUTH DAILY   Facility-Administered Encounter Medications as of 03/24/2021  Medication  . triamcinolone acetonide (KENALOG-40) injection 80 mg   Contacted [name] for general disease state and medication adherence call.    Patient is not > 5 days past due for refill on the following medications per chart history:   [facility] Medications: Medication Name  Last Fill Days Supply Losartan 50 mg.  01/19/2021 90DS   Rosuvastatin 20mg .  03/05/2021 90DS    What concerns do  you have about your medications?  Patient stated Rosuvastatin making her tired.   How often do you forget or accidentally miss a dose? Occasionally   Do you use a pillbox? No   Are you having any problems getting your medications from your pharmacy? Not really, patient stated she sometimes forget   Since last visit with CPP, patient stated that no interventions have been made:  No  The patient has not had an ED visit since last contact.   The patient denies problems with their health.   Patient stated that she does not have any concerns or questions for Sherre Poot, at this time.   Patient states BP while I was on the phone and it was 159/76. Patient rechecked it after a few minutes and it was 149/74.    Counseled patient on:   Importance of taking medication daily without missed doses  Benefits of adherence packaging or a pillbox   Access to CCM team for any cost, medication, or pharmacy concern   Care Gaps: Last annual wellness visit? 10/11/2020   Marcine Matar, [place] Clinical Pharmacist Assistant

## 2021-04-13 ENCOUNTER — Other Ambulatory Visit: Payer: Self-pay | Admitting: Gastroenterology

## 2021-04-22 ENCOUNTER — Other Ambulatory Visit: Payer: Self-pay | Admitting: Family Medicine

## 2021-04-23 DIAGNOSIS — L578 Other skin changes due to chronic exposure to nonionizing radiation: Secondary | ICD-10-CM | POA: Diagnosis not present

## 2021-04-23 DIAGNOSIS — L821 Other seborrheic keratosis: Secondary | ICD-10-CM | POA: Diagnosis not present

## 2021-04-23 DIAGNOSIS — L814 Other melanin hyperpigmentation: Secondary | ICD-10-CM | POA: Diagnosis not present

## 2021-04-23 DIAGNOSIS — L570 Actinic keratosis: Secondary | ICD-10-CM | POA: Diagnosis not present

## 2021-04-23 DIAGNOSIS — D225 Melanocytic nevi of trunk: Secondary | ICD-10-CM | POA: Diagnosis not present

## 2021-04-26 ENCOUNTER — Other Ambulatory Visit: Payer: Self-pay | Admitting: Nurse Practitioner

## 2021-04-26 DIAGNOSIS — I5022 Chronic systolic (congestive) heart failure: Secondary | ICD-10-CM

## 2021-04-30 ENCOUNTER — Other Ambulatory Visit
Admission: RE | Admit: 2021-04-30 | Discharge: 2021-04-30 | Disposition: A | Discharge status: Home / Self Care | Payer: Medicare (Managed Care) | Source: Ambulatory Visit | Attending: Primary Care | Admitting: Primary Care

## 2021-04-30 DIAGNOSIS — E559 Vitamin D deficiency, unspecified: Secondary | ICD-10-CM | POA: Insufficient documentation

## 2021-04-30 DIAGNOSIS — I10 Essential (primary) hypertension: Secondary | ICD-10-CM | POA: Insufficient documentation

## 2021-04-30 DIAGNOSIS — E7800 Pure hypercholesterolemia, unspecified: Secondary | ICD-10-CM | POA: Insufficient documentation

## 2021-04-30 LAB — CBC
Hematocrit: 49 % — ABNORMAL HIGH (ref 34–45)
Hemoglobin: 16.1 g/dL — ABNORMAL HIGH (ref 11.2–15.7)
MCH: 30 pg (ref 26–32)
MCHC: 33 g/dL (ref 32–36)
MCV: 90 fL (ref 79–95)
Platelets: 214 10*3/uL (ref 160–370)
RBC: 5.4 MIL/uL — ABNORMAL HIGH (ref 3.9–5.2)
RDW: 16 % — ABNORMAL HIGH (ref 11.7–14.4)
WBC: 5.5 10*3/uL (ref 4.0–10.0)

## 2021-04-30 LAB — COMPREHENSIVE METABOLIC PANEL
ALT: 8 U/L (ref 0–35)
AST: 22 U/L (ref 0–35)
Albumin: 4.5 g/dL (ref 3.5–5.2)
Alk Phos: 62 U/L (ref 35–105)
Anion Gap: 16 (ref 7–16)
Bilirubin,Total: 0.4 mg/dL (ref 0.0–1.2)
CO2: 23 mmol/L (ref 20–28)
Calcium: 10.3 mg/dL — ABNORMAL HIGH (ref 8.6–10.2)
Chloride: 96 mmol/L (ref 96–108)
Creatinine: 0.6 mg/dL (ref 0.51–0.95)
Glucose: 85 mg/dL (ref 60–99)
Lab: 5 mg/dL — ABNORMAL LOW (ref 6–20)
Potassium: 3.8 mmol/L (ref 3.4–4.7)
Sodium: 135 mmol/L (ref 133–145)
Total Protein: 7.9 g/dL — ABNORMAL HIGH (ref 6.3–7.7)
eGFR BY CREAT: 94 *

## 2021-04-30 LAB — TSH: TSH: 1.39 u[IU]/mL (ref 0.27–4.20)

## 2021-04-30 LAB — LIPID PANEL
Chol/HDL Ratio: 2.7
Cholesterol: 206 mg/dL — AB
HDL: 75 mg/dL — ABNORMAL HIGH (ref 40–60)
LDL Calculated: 107 mg/dL
Non HDL Cholesterol: 131 mg/dL
Triglycerides: 122 mg/dL

## 2021-04-30 LAB — VITAMIN D: 25-OH Vit Total: 56 ng/mL (ref 30–60)

## 2021-05-07 DIAGNOSIS — H353132 Nonexudative age-related macular degeneration, bilateral, intermediate dry stage: Secondary | ICD-10-CM | POA: Diagnosis not present

## 2021-05-13 DIAGNOSIS — Z4502 Encounter for adjustment and management of automatic implantable cardiac defibrillator: Secondary | ICD-10-CM | POA: Diagnosis not present

## 2021-05-25 NOTE — Progress Notes (Deleted)
duplicate

## 2021-05-26 ENCOUNTER — Ambulatory Visit (INDEPENDENT_AMBULATORY_CARE_PROVIDER_SITE_OTHER): Payer: Medicare Other | Admitting: Family Medicine

## 2021-05-26 ENCOUNTER — Other Ambulatory Visit: Payer: Self-pay

## 2021-05-26 ENCOUNTER — Encounter: Payer: Self-pay | Admitting: Family Medicine

## 2021-05-26 VITALS — BP 122/68 | HR 70 | Temp 97.0°F | Resp 20 | Wt 165.4 lb

## 2021-05-26 DIAGNOSIS — E782 Mixed hyperlipidemia: Secondary | ICD-10-CM

## 2021-05-26 DIAGNOSIS — E039 Hypothyroidism, unspecified: Secondary | ICD-10-CM | POA: Diagnosis not present

## 2021-05-26 DIAGNOSIS — I5022 Chronic systolic (congestive) heart failure: Secondary | ICD-10-CM

## 2021-05-26 DIAGNOSIS — Z95810 Presence of automatic (implantable) cardiac defibrillator: Secondary | ICD-10-CM

## 2021-05-26 DIAGNOSIS — M25551 Pain in right hip: Secondary | ICD-10-CM | POA: Diagnosis not present

## 2021-05-26 DIAGNOSIS — I110 Hypertensive heart disease with heart failure: Secondary | ICD-10-CM

## 2021-05-26 NOTE — Progress Notes (Signed)
Subjective:  Patient ID: Debra Mann, female    DOB: 05/01/1946  Age: 75 y.o. MRN: [id number]  Chief Complaint  Patient presents with   Hyperlipidemia   Hypertension   HPI: Mixed hyperlipidemia Takes Crestor 20 mg daily. She does not eat healthy. Acquired hypothyroidism Takes Synthroid 88 mcg daily Hypertensive heart disease with chronic systolic congestive heart failure ([name])  She takes Cozaar 50 mg daily and Lasix 20 mg prn. She did state that she has taken two to three Lasix tablets over the past couple of weeks but denies any swelling or shortness of breath today.  BMI 27.52   Current Outpatient Medications on File Prior to Visit  Medication Sig Dispense Refill   acetaminophen (TYLENOL) 500 MG tablet Take 500 mg by mouth in the morning and at bedtime.     aspirin 81 MG EC tablet Take 81 mg by mouth daily.      Calcium Carbonate-Vitamin D 500-125 MG-UNIT TABS Take 1 tablet by mouth in the morning and at bedtime.     carvedilol (COREG) 12.5 MG tablet TAKE 1 TABLET(12.5 MG) BY MOUTH TWICE DAILY WITH A MEAL 180 tablet 0   furosemide (LASIX) 20 MG tablet Take 20 mg by mouth daily as needed.      glucosamine-chondroitin 500-400 MG tablet Take 1 tablet by mouth 2 (two) times daily. (Patient not taking: Reported on 05/26/2021)     levothyroxine (SYNTHROID) 88 MCG tablet TAKE 1 TABLET(88 MCG) BY MOUTH DAILY 90 tablet 1   losartan (COZAAR) 50 MG tablet TAKE 1 TABLET(50 MG) BY MOUTH DAILY 90 tablet 0   Multiple Vitamin (MULTIVITAMIN) tablet Take 1 tablet by mouth daily.     Multiple Vitamins-Minerals (EYE VITAMINS PO) Take 1 tablet by mouth daily.     Omega-3 1000 MG CAPS Take 1 capsule by mouth daily.      rosuvastatin (CRESTOR) 20 MG tablet TAKE 1 TABLET(20 MG) BY MOUTH DAILY 90 tablet 0   Current Facility-Administered Medications on File Prior to Visit  Medication Dose Route Frequency Provider Last Rate Last Admin   triamcinolone acetonide (KENALOG-40) injection 80 mg  80 mg  Intra-articular Once Jerelle Virden, Elnita Maxwell, MD       Past Medical History:  Diagnosis Date   Anoxic brain damage, not elsewhere classified ([place])    Cardiac arrest, cause unspecified ([place])    Hyperlipidemia    Nonrheumatic mitral (valve) insufficiency    Presence of automatic (implantable) cardiac defibrillator    Spinal stenosis, lumbar region without neurogenic claudication    Thyroid disease    Unilateral primary osteoarthritis, left knee    Unqualified visual loss of both eyes    Past Surgical History:  Procedure Laterality Date   [place]  04/2019   PACEMAKER INSERTION  2004, 2013   2014 replaced  lead wires;with biventricular defibrillator: pericardial sac infusion   REVISION TOTAL HIP ARTHROPLASTY Left 03/2011   blood transfusion 03/2011   TONSILLECTOMY     TOTAL VAGINAL HYSTERECTOMY  1991    Family History  Problem Relation Age of Onset   Diabetes Mother    Transient ischemic attack Father    Social History   Socioeconomic History   Marital status: Married    Spouse name: Not on file   Number of children: 1   Years of education: Not on file   Highest education level: Not on file  Occupational History   Not on file  Tobacco Use   Smoking status: Never   Smokeless tobacco: Never  Vaping Use   Vaping Use: Never used  Substance and Sexual Activity   Alcohol use: Never   Drug use: Never   Sexual activity: Not on file  Other Topics Concern   Not on file  Social History Narrative   Not on file   Social Determinants of Health   Financial Resource Strain: Not on file  Food Insecurity: Not on file  Transportation Needs: Not on file  Physical Activity: Not on file  Stress: Not on file  Social Connections: Not on file    Review of Systems  Constitutional:  Negative for chills and fatigue.  HENT:  Negative for congestion and sore throat.   Eyes:  Negative for visual disturbance.  Respiratory:  Negative for  cough and shortness of breath.   Cardiovascular:  Negative for chest pain, palpitations and leg swelling.  Gastrointestinal:  Negative for constipation, diarrhea, nausea and vomiting.  Endocrine: Negative for polyphagia and polyuria.  Genitourinary:  Negative for difficulty urinating, frequency and urgency.  Musculoskeletal:  Positive for arthralgias.  Skin:  Negative for rash.  Neurological:  Negative for dizziness, weakness and numbness.  Psychiatric/Behavioral:  The patient is not nervous/anxious.     Objective:  BP 122/68   Pulse 70   Temp (!) 97 F (36.1 C)   Resp 20   Wt 165 lb 6.4 oz (75 kg)   SpO2 99%   BMI 27.52 kg/m   BP/Weight 05/26/2021 11/20/2020 [id number]  Systolic BP [id number]  Diastolic BP 68 82 74  Wt. (Lbs) 165.4 164 165.6  BMI 27.52 27.29 27.56    Physical Exam Vitals reviewed.  Constitutional:      Appearance: Normal appearance. She is normal weight.  HENT:     Head: Normocephalic.     Right Ear: Tympanic membrane, ear canal and external ear normal.     Left Ear: Tympanic membrane, ear canal and external ear normal.     Nose: Nose normal.     Mouth/Throat:     Mouth: Mucous membranes are moist.  Neck:     Vascular: No carotid bruit.  Cardiovascular:     Rate and Rhythm: Normal rate and regular rhythm.     Pulses: Normal pulses.     Heart sounds: Normal heart sounds.  Pulmonary:     Effort: Pulmonary effort is normal. No respiratory distress.     Breath sounds: Normal breath sounds.  Abdominal:     General: Abdomen is flat. Bowel sounds are normal.     Palpations: Abdomen is soft.     Tenderness: There is no abdominal tenderness.  Musculoskeletal:        General: Tenderness (rt trochanteric: mild. FROM , but discomfort with external rotation.) present.  Skin:    General: Skin is warm and dry.  Neurological:     General: No focal deficit present.     Mental Status: She is alert and oriented to person, place, and time.  Psychiatric:         Mood and Affect: Mood normal.        Behavior: Behavior normal.     Lab Results  Component Value Date   WBC 6.1 11/20/2020   HGB 13.1 11/20/2020   HCT 39.6 11/20/2020   PLT 286 11/20/2020   GLUCOSE 99 11/20/2020   CHOL 161 11/20/2020   TRIG 81 11/20/2020   HDL 55 11/20/2020   LDLCALC 91 11/20/2020  ALT 14 11/20/2020   AST 20 11/20/2020   NA 137 11/20/2020   K 4.7 11/20/2020   CL 101 11/20/2020   CREATININE 0.75 11/20/2020   BUN 17 11/20/2020   CO2 25 11/20/2020   TSH 0.932 11/20/2020      Assessment & Plan:  1. Mixed hyperlipidemia - Well controlled - Lipid panel - Attempt to eat healthier diet  2. Acquired hypothyroidism - At goal - Continue Synthroid 88 mg daily  3. Hypertensive heart disease with chronic systolic congestive heart failure (HCC) - Controlled - CBC with Differential/Platelet - Comprehensive metabolic panel - Continue Coreg 12.5 mg bid - Continue Lasix prn - Attempt to eat healthy diet  4. Right hip pain - DG Hip Unilat W OR W/O Pelvis Min 4 Views Right   5. Presence of defibrillator.  Monitored by Dr. Phillis Haggis.    Follow-up: No follow-ups on file.  An After Visit Summary was printed and given to the patient.  [name], MD [facility] [phone]

## 2021-05-27 LAB — COMPREHENSIVE METABOLIC PANEL
ALT: 13 IU/L (ref 0–32)
AST: 20 IU/L (ref 0–40)
Albumin/Globulin Ratio: 1.8 (ref 1.2–2.2)
Albumin: 4.4 g/dL (ref 3.7–4.7)
Alkaline Phosphatase: 80 IU/L (ref 44–121)
BUN/Creatinine Ratio: 18 (ref 12–28)
BUN: 13 mg/dL (ref 8–27)
Bilirubin Total: 0.4 mg/dL (ref 0.0–1.2)
CO2: 23 mmol/L (ref 20–29)
Calcium: 9.8 mg/dL (ref 8.7–10.3)
Chloride: 100 mmol/L (ref 96–106)
Creatinine, Ser: 0.71 mg/dL (ref 0.57–1.00)
Globulin, Total: 2.4 g/dL (ref 1.5–4.5)
Glucose: 101 mg/dL — ABNORMAL HIGH (ref 65–99)
Potassium: 4.9 mmol/L (ref 3.5–5.2)
Sodium: 138 mmol/L (ref 134–144)
Total Protein: 6.8 g/dL (ref 6.0–8.5)
eGFR: 89 mL/min/{1.73_m2} (ref >59)

## 2021-05-27 LAB — LIPID PANEL
Chol/HDL Ratio: 3.1 ratio (ref 0.0–4.4)
Cholesterol, Total: 156 mg/dL (ref 100–199)
HDL: 50 mg/dL (ref >39)
LDL Chol Calc (NIH): 83 mg/dL (ref 0–99)
Triglycerides: 133 mg/dL (ref 0–149)
VLDL Cholesterol Cal: 23 mg/dL (ref 5–40)

## 2021-05-27 LAB — CBC WITH DIFFERENTIAL/PLATELET
Basophils Absolute: 0.1 10*3/uL (ref 0.0–0.2)
Basos: 1 %
EOS (ABSOLUTE): 0.2 10*3/uL (ref 0.0–0.4)
Eos: 4 %
Hematocrit: 41.8 % (ref 34.0–46.6)
Hemoglobin: 13.5 g/dL (ref 11.1–15.9)
Immature Grans (Abs): 0 10*3/uL (ref 0.0–0.1)
Immature Granulocytes: 0 %
Lymphocytes Absolute: 1.6 10*3/uL (ref 0.7–3.1)
Lymphs: 28 %
MCH: 30.2 pg (ref 26.6–33.0)
MCHC: 32.3 g/dL (ref 31.5–35.7)
MCV: 94 fL (ref 79–97)
Monocytes Absolute: 0.9 10*3/uL (ref 0.1–0.9)
Monocytes: 15 %
Neutrophils Absolute: 3 10*3/uL (ref 1.4–7.0)
Neutrophils: 52 %
Platelets: 263 10*3/uL (ref 150–450)
RBC: 4.47 x10E6/uL (ref 3.77–5.28)
RDW: 12.4 % (ref 11.7–15.4)
WBC: 5.8 10*3/uL (ref 3.4–10.8)

## 2021-05-27 LAB — CARDIOVASCULAR RISK ASSESSMENT

## 2021-05-28 DIAGNOSIS — E039 Hypothyroidism, unspecified: Secondary | ICD-10-CM | POA: Diagnosis not present

## 2021-05-28 DIAGNOSIS — I428 Other cardiomyopathies: Secondary | ICD-10-CM | POA: Diagnosis not present

## 2021-05-28 DIAGNOSIS — I10 Essential (primary) hypertension: Secondary | ICD-10-CM | POA: Diagnosis not present

## 2021-05-28 DIAGNOSIS — I472 Ventricular tachycardia: Secondary | ICD-10-CM | POA: Diagnosis not present

## 2021-05-28 DIAGNOSIS — Z8674 Personal history of sudden cardiac arrest: Secondary | ICD-10-CM | POA: Diagnosis not present

## 2021-05-28 DIAGNOSIS — Z95810 Presence of automatic (implantable) cardiac defibrillator: Secondary | ICD-10-CM | POA: Diagnosis not present

## 2021-06-01 ENCOUNTER — Other Ambulatory Visit: Payer: Self-pay | Admitting: Family Medicine

## 2021-06-29 ENCOUNTER — Telehealth: Payer: Medicare Other

## 2021-07-08 ENCOUNTER — Telehealth: Payer: Self-pay

## 2021-07-08 NOTE — Chronic Care Management (AMB) (Signed)
Chronic Care Management Pharmacy Assistant   Name: Debra Mann  MRN: [id number] DOB: 1945-11-20   Reason for Encounter: Disease State call for HTN and to Reschedule f/u   Recent office visits:  05/26/21 Debra Brome MD. Seen for HTN and HLD. Ordered a DG of Hip. Referred to [facility]. No med changes.   Recent consult visits:  05/28/21 [name], Debra Mann. Cardiology. Seen for Paroxysmal VT. No med changes. Follow up in 6 months  Hospital visits:  None in previous 6 months  Medications: Outpatient Encounter Medications as of 07/08/2021  Medication Sig   acetaminophen (TYLENOL) 500 MG tablet Take 500 mg by mouth in the morning and at bedtime.   aspirin 81 MG EC tablet Take 81 mg by mouth daily.    Calcium Carbonate-Vitamin D 500-125 MG-UNIT TABS Take 1 tablet by mouth in the morning and at bedtime.   carvedilol (COREG) 12.5 MG tablet TAKE 1 TABLET(12.5 MG) BY MOUTH TWICE DAILY WITH A MEAL   furosemide (LASIX) 20 MG tablet Take 20 mg by mouth daily as needed.    glucosamine-chondroitin 500-400 MG tablet Take 1 tablet by mouth 2 (two) times daily. (Patient not taking: Reported on 05/26/2021)   levothyroxine (SYNTHROID) 88 MCG tablet TAKE 1 TABLET(88 MCG) BY MOUTH DAILY   losartan (COZAAR) 50 MG tablet TAKE 1 TABLET(50 MG) BY MOUTH DAILY   Multiple Vitamin (MULTIVITAMIN) tablet Take 1 tablet by mouth daily.   Multiple Vitamins-Minerals (EYE VITAMINS PO) Take 1 tablet by mouth daily.   Omega-3 1000 MG CAPS Take 1 capsule by mouth daily.    rosuvastatin (CRESTOR) 20 MG tablet TAKE 1 TABLET(20 MG) BY MOUTH DAILY   Facility-Administered Encounter Medications as of 07/08/2021  Medication   triamcinolone acetonide (KENALOG-40) injection 80 mg    Recent Office Vitals: BP Readings from Last 3 Encounters:  05/26/21 122/68  11/20/20 (!) 142/82  10/28/20 132/74   Pulse Readings from Last 3 Encounters:  05/26/21 70  11/20/20 68  10/28/20 69    Wt Readings from Last  3 Encounters:  05/26/21 165 lb 6.4 oz (75 kg)  11/20/20 164 lb (74.4 kg)  10/28/20 165 lb 9.6 oz (75.1 kg)     Kidney Function Lab Results  Component Value Date/Time   CREATININE 0.71 05/26/2021 08:04 AM   CREATININE 0.75 11/20/2020 08:02 AM   GFRNONAA 79 11/20/2020 08:02 AM   GFRAA 91 11/20/2020 08:02 AM    BMP Latest Ref Rng & Units 05/26/2021 11/20/2020 05/21/2020  Glucose 65 - 99 mg/dL 101(H) 99 89  BUN 8 - 27 mg/dL 13 17 15   Creatinine 0.57 - 1.00 mg/dL 0.71 0.75 0.85  BUN/Creat Ratio 12 - 28 18 23 18   Sodium 134 - 144 mmol/L 138 137 137  Potassium 3.5 - 5.2 mmol/L 4.9 4.7 4.7  Chloride 96 - 106 mmol/L 100 101 101  CO2 20 - 29 mmol/L 23 25 24   Calcium 8.7 - 10.3 mg/dL 9.8 9.7 9.9     Current antihypertensive regimen:  Losartan 50  mg daily Carvedilol 12.5 mg twice daily  Patient verbally confirms she is taking the above medications as directed. Yes  How often are you checking your Blood Pressure? infrequently  she checks her blood pressure in the morning before taking her medication.  Current home BP readings: Pt does not check on a regular basis  DATE:             BP  PULSE      07/08/21           145/76  70   Wrist or arm cuff: She uses an arm cuff  Caffeine intake:1 cup coffee in morning and tea at lunch soda in evening  Salt intake:She stated she does not salt a lot of foods OTC medications including pseudoephedrine or NSAIDs? Tylenol 500 mg 2 in morning and two at night  Any readings above 180/120? No  What recent interventions/DTPs have been made by any provider to improve Blood Pressure control since last CPP Visit: Pt states no changes have been made  Any recent hospitalizations or ED visits since last visit with CPP? No  What diet changes have been made to improve Blood Pressure Control?  Pt states she eats whatever she wants because you only live once.  What exercise is being done to improve your Blood Pressure Control?  Pt has knee and  hip pain so she cannot do a lot of exercise but walks around the house and to the store.  Adherence Review: Is the patient currently on ACE/ARB medication? Yes Does the patient have >5 day gap between last estimated fill dates? CPP to review  Care Gaps: Last annual wellness visit: Completed on 10/28/20  Star Rating Drugs:  Medication:  Last Fill: Day Supply Donnie Coffin  04/26/21 90ds Rosuvastatin  06/12/21 90ds    Pt appt has been rescheduled along with her husband on 07/26/21 @ 2:45pm   [place]

## 2021-07-26 ENCOUNTER — Telehealth: Payer: Medicare Other

## 2021-07-26 ENCOUNTER — Telehealth: Payer: Self-pay

## 2021-07-26 NOTE — Telephone Encounter (Signed)
Patient's husband said that her wife would not be at home for the appointment at 2:45 pm today. Can you call her back to reschedule.

## 2021-07-29 ENCOUNTER — Telehealth: Payer: Self-pay

## 2021-07-29 NOTE — Progress Notes (Signed)
I called pt today to reschedule f/u appt and the pt and her husband advised that they not want our services and they see no need. I advised that we are there to help them and fill in the gaps of their care and they insisted on not following up with [place] and stated they will call their PCP if they need something.  [place] Clinical Pharmacist Assitant [phone]

## 2021-08-02 ENCOUNTER — Other Ambulatory Visit: Payer: Self-pay | Admitting: Nurse Practitioner

## 2021-08-02 DIAGNOSIS — I5022 Chronic systolic (congestive) heart failure: Secondary | ICD-10-CM

## 2021-08-07 DIAGNOSIS — S9032XA Contusion of left foot, initial encounter: Secondary | ICD-10-CM | POA: Diagnosis not present

## 2021-08-07 DIAGNOSIS — S92302A Fracture of unspecified metatarsal bone(s), left foot, initial encounter for closed fracture: Secondary | ICD-10-CM | POA: Diagnosis not present

## 2021-08-07 DIAGNOSIS — S0990XA Unspecified injury of head, initial encounter: Secondary | ICD-10-CM | POA: Diagnosis not present

## 2021-08-09 DIAGNOSIS — S060X0A Concussion without loss of consciousness, initial encounter: Secondary | ICD-10-CM | POA: Diagnosis not present

## 2021-09-07 ENCOUNTER — Other Ambulatory Visit: Payer: Self-pay | Admitting: Family Medicine

## 2021-09-28 ENCOUNTER — Other Ambulatory Visit: Payer: Self-pay

## 2021-09-28 MED ORDER — FUROSEMIDE 20 MG PO TABS: 20.0000 mg | ORAL_TABLET | Freq: Every day | ORAL | 2 refills | Status: AC | PRN

## 2021-10-07 ENCOUNTER — Other Ambulatory Visit
Admission: RE | Admit: 2021-10-07 | Discharge: 2021-10-07 | Disposition: A | Discharge status: Home / Self Care | Payer: Medicare (Managed Care) | Source: Ambulatory Visit | Attending: Primary Care | Admitting: Primary Care

## 2021-10-07 DIAGNOSIS — I10 Essential (primary) hypertension: Secondary | ICD-10-CM | POA: Insufficient documentation

## 2021-10-07 DIAGNOSIS — Z79899 Other long term (current) drug therapy: Secondary | ICD-10-CM | POA: Insufficient documentation

## 2021-10-07 DIAGNOSIS — E559 Vitamin D deficiency, unspecified: Secondary | ICD-10-CM | POA: Insufficient documentation

## 2021-10-07 DIAGNOSIS — F1190 Opioid use, unspecified, uncomplicated: Secondary | ICD-10-CM | POA: Insufficient documentation

## 2021-10-07 LAB — LIPID PANEL
Chol/HDL Ratio: 2.8
Cholesterol: 245 mg/dL — AB
HDL: 87 mg/dL — ABNORMAL HIGH (ref 40–60)
LDL Calculated: 140 mg/dL — AB
Non HDL Cholesterol: 158 mg/dL
Triglycerides: 89 mg/dL

## 2021-10-07 LAB — COMPREHENSIVE METABOLIC PANEL
ALT: 6 U/L (ref 0–35)
AST: 16 U/L (ref 0–35)
Albumin: 4.4 g/dL (ref 3.5–5.2)
Alk Phos: 83 U/L (ref 35–105)
Anion Gap: 14 (ref 7–16)
Bilirubin,Total: 0.3 mg/dL (ref 0.0–1.2)
CO2: 25 mmol/L (ref 20–28)
Calcium: 10.1 mg/dL (ref 8.6–10.2)
Chloride: 95 mmol/L — ABNORMAL LOW (ref 96–108)
Creatinine: 0.72 mg/dL (ref 0.51–0.95)
Glucose: 97 mg/dL (ref 60–99)
Lab: 10 mg/dL (ref 6–20)
Potassium: 4.3 mmol/L (ref 3.4–4.7)
Sodium: 134 mmol/L (ref 133–145)
Total Protein: 8.1 g/dL — ABNORMAL HIGH (ref 6.3–7.7)
eGFR BY CREAT: 87 *

## 2021-10-07 LAB — VITAMIN D: 25-OH Vit Total: 37 ng/mL (ref 30–60)

## 2021-10-07 LAB — CBC
Hematocrit: 50 % — ABNORMAL HIGH (ref 34–45)
Hemoglobin: 16.3 g/dL — ABNORMAL HIGH (ref 11.2–15.7)
MCH: 29 pg (ref 26–32)
MCHC: 32 g/dL (ref 32–36)
MCV: 90 fL (ref 79–95)
Platelets: 280 10*3/uL (ref 160–370)
RBC: 5.6 MIL/uL — ABNORMAL HIGH (ref 3.9–5.2)
RDW: 14 % (ref 11.7–14.4)
WBC: 6 10*3/uL (ref 4.0–10.0)

## 2021-10-07 LAB — DRUG SCREEN CHEMICAL DEPENDENCY, URINE
Amphetamine,UR: NEGATIVE
Benzodiazepinen,UR: POSITIVE
Cocaine/Metab,UR: NEGATIVE
Fentanyl, UR: NEGATIVE ng/mL
Opiates,UR: NEGATIVE
THC Metabolite,UR: NEGATIVE

## 2021-10-07 LAB — TSH: TSH: 1.33 u[IU]/mL (ref 0.27–4.20)

## 2021-10-12 LAB — BENZODIAZEPINES QUAL,UR

## 2021-10-12 LAB — BENZODIAZEPINE, CONFIRMATION, URINE

## 2021-10-21 DIAGNOSIS — I509 Heart failure, unspecified: Secondary | ICD-10-CM | POA: Diagnosis not present

## 2021-10-21 DIAGNOSIS — E663 Overweight: Secondary | ICD-10-CM | POA: Diagnosis not present

## 2021-10-21 DIAGNOSIS — I10 Essential (primary) hypertension: Secondary | ICD-10-CM | POA: Diagnosis not present

## 2021-10-21 DIAGNOSIS — I4891 Unspecified atrial fibrillation: Secondary | ICD-10-CM | POA: Diagnosis not present

## 2021-10-21 DIAGNOSIS — E785 Hyperlipidemia, unspecified: Secondary | ICD-10-CM | POA: Diagnosis not present

## 2021-10-21 DIAGNOSIS — Z9849 Cataract extraction status, unspecified eye: Secondary | ICD-10-CM | POA: Diagnosis not present

## 2021-10-21 DIAGNOSIS — D6869 Other thrombophilia: Secondary | ICD-10-CM | POA: Diagnosis not present

## 2021-10-21 DIAGNOSIS — Z950 Presence of cardiac pacemaker: Secondary | ICD-10-CM | POA: Diagnosis not present

## 2021-10-21 DIAGNOSIS — Z7982 Long term (current) use of aspirin: Secondary | ICD-10-CM | POA: Diagnosis not present

## 2021-10-21 DIAGNOSIS — I110 Hypertensive heart disease with heart failure: Secondary | ICD-10-CM | POA: Diagnosis not present

## 2021-10-21 DIAGNOSIS — E261 Secondary hyperaldosteronism: Secondary | ICD-10-CM | POA: Diagnosis not present

## 2021-10-25 DIAGNOSIS — H1089 Other conjunctivitis: Secondary | ICD-10-CM | POA: Diagnosis not present

## 2021-11-05 ENCOUNTER — Other Ambulatory Visit: Payer: Self-pay | Admitting: Nurse Practitioner

## 2021-11-05 DIAGNOSIS — I5022 Chronic systolic (congestive) heart failure: Secondary | ICD-10-CM

## 2021-11-17 ENCOUNTER — Telehealth (INDEPENDENT_AMBULATORY_CARE_PROVIDER_SITE_OTHER): Payer: PPO | Admitting: Physician Assistant

## 2021-11-17 ENCOUNTER — Other Ambulatory Visit: Payer: Self-pay

## 2021-11-17 ENCOUNTER — Encounter: Payer: Self-pay | Admitting: Physician Assistant

## 2021-11-17 VITALS — Ht 65.0 in | Wt 165.0 lb

## 2021-11-17 DIAGNOSIS — U071 COVID-19: Secondary | ICD-10-CM

## 2021-11-17 DIAGNOSIS — R5381 Other malaise: Secondary | ICD-10-CM | POA: Diagnosis not present

## 2021-11-17 LAB — POCT INFLUENZA A/B
Influenza A, POC: NEGATIVE
Influenza B, POC: NEGATIVE

## 2021-11-17 LAB — POC COVID19 BINAXNOW: SARS Coronavirus 2 Ag: POSITIVE — AB

## 2021-11-17 MED ORDER — MOLNUPIRAVIR EUA 200MG CAPSULE
4.0000 | ORAL_CAPSULE | Freq: Two times a day (BID) | ORAL | 0 refills | Status: AC
Start: 2021-11-17 — End: 2021-11-22

## 2021-11-17 MED ORDER — PROMETHAZINE-DM 6.25-15 MG/5ML PO SYRP: 5.0000 mL | ORAL_SOLUTION | Freq: Four times a day (QID) | ORAL | 0 refills | Status: DC | PRN

## 2021-11-17 NOTE — Progress Notes (Signed)
Virtual Visit via Telephone Note   This visit type was conducted due to national recommendations for restrictions regarding the COVID-19 Pandemic (e.g. social distancing) in an effort to limit this patient's exposure and mitigate transmission in our community.  Due to her co-morbid illnesses, this patient is at least at moderate risk for complications without adequate follow up.  This format is felt to be most appropriate for this patient at this time.  The patient did not have access to video technology/had technical difficulties with video requiring transitioning to audio format only (telephone).  All issues noted in this document were discussed and addressed.  No physical exam could be performed with this format.  Patient verbally consented to a telehealth visit.   Date:  11/17/2021   ID:  Debra Mann, DOB August 21, 1946, MRN [id number]  Patient Location: Home Provider Location: Office  PCP:  [name], MD   Chief Complaint:  cough, weak and exposure to COVID  History of Present Illness:    Debra Mann is a 76 y.o. female with complaints of cough, congestion, weakness and fever since Monday -    The patient does have symptoms concerning for COVID-19 infection (fever, chills, cough, or new shortness of breath).    Past Medical History:  Diagnosis Date   Anoxic brain damage, not elsewhere classified ([place])    Cardiac arrest, cause unspecified ([place])    Hyperlipidemia    Nonrheumatic mitral (valve) insufficiency    Presence of automatic (implantable) cardiac defibrillator    Spinal stenosis, lumbar region without neurogenic claudication    Thyroid disease    Unilateral primary osteoarthritis, left knee    Unqualified visual loss of both eyes    Past Surgical History:  Procedure Laterality Date   [place]   LUMBAR FUSION  04/2019   PACEMAKER INSERTION  2004, 2013   2014 replaced  lead wires;with biventricular defibrillator: pericardial sac  infusion   REVISION TOTAL HIP ARTHROPLASTY Left 03/2011   blood transfusion 03/2011   TONSILLECTOMY     TOTAL VAGINAL HYSTERECTOMY  1991     Current Meds  Medication Sig   acetaminophen (TYLENOL) 500 MG tablet Take 500 mg by mouth in the morning and at bedtime.   aspirin 81 MG EC tablet Take 81 mg by mouth daily.    carvedilol (COREG) 12.5 MG tablet TAKE 1 TABLET(12.5 MG) BY MOUTH TWICE DAILY WITH A MEAL   furosemide (LASIX) 20 MG tablet Take 1 tablet (20 mg total) by mouth daily as needed.   levothyroxine (SYNTHROID) 88 MCG tablet TAKE 1 TABLET(88 MCG) BY MOUTH DAILY   losartan (COZAAR) 50 MG tablet TAKE 1 TABLET(50 MG) BY MOUTH DAILY   Multiple Vitamin (MULTIVITAMIN) tablet Take 1 tablet by mouth daily.   Multiple Vitamins-Minerals (EYE VITAMINS PO) Take 1 tablet by mouth daily.   Omega-3 1000 MG CAPS Take 1 capsule by mouth daily.    rosuvastatin (CRESTOR) 20 MG tablet TAKE 1 TABLET(20 MG) BY MOUTH DAILY   [DISCONTINUED] Calcium Carbonate-Vitamin D 500-125 MG-UNIT TABS Take 1 tablet by mouth in the morning and at bedtime.   Current Facility-Administered Medications for the 11/17/21 encounter (Video Visit) with Marge Duncans, PA-C  Medication   triamcinolone acetonide (KENALOG-40) injection 80 mg     Allergies:   Atorvastatin   Social History   Tobacco Use   Smoking status: Never   Smokeless tobacco: Never  Vaping Use   Vaping Use: Never used  Substance Use Topics   Alcohol use: Never   Drug use: Never     Family Hx: The patient's family history includes Diabetes in her mother; Transient ischemic attack in her father.  ROS:   Please see the history of present illness.    All other systems reviewed and are negative.  Labs/Other Tests and Data Reviewed:    Recent Labs: 11/20/2020: TSH 0.932 05/26/2021: ALT 13; BUN 13; Creatinine, Ser 0.71; Hemoglobin 13.5; Platelets 263; Potassium 4.9; Sodium 138   Recent Lipid Panel Lab Results  Component Value Date/Time   CHOL 156  05/26/2021 08:04 AM   TRIG 133 05/26/2021 08:04 AM   HDL 50 05/26/2021 08:04 AM   CHOLHDL 3.1 05/26/2021 08:04 AM   LDLCALC 83 05/26/2021 08:04 AM    Wt Readings from Last 3 Encounters:  11/17/21 165 lb (74.8 kg)  05/26/21 165 lb 6.4 oz (75 kg)  11/20/20 164 lb (74.4 kg)     Objective:    Vital Signs:  Ht 5\' 5"  (1.651 m)    Wt 165 lb (74.8 kg)    BMI 27.46 kg/m    VITAL SIGNS:  reviewed GEN:    Video Visit on 11/17/2021  Component Date Value Ref Range Status   SARS Coronavirus 2 Ag 11/17/2021 Positive (A)  Negative Final   Influenza A, POC 11/17/2021 Negative  Negative Final   Influenza B, POC 11/17/2021 Negative  Negative Final    ASSESSMENT & PLAN:    COVID - rx for molnupiravir and phenergan DM to take as directed --- rest, fluids, tylenol  COVID-19 Education: The signs and symptoms of COVID-19 were discussed with the patient and how to seek care for testing (follow up with PCP or arrange E-visit). The importance of social distancing was discussed today.  Time:   Today, I have spent 10 minutes with the patient with telehealth technology discussing the above problems.     Medication Adjustments/Labs and Tests Ordered: Current medicines are reviewed at length with the patient today.  Concerns regarding medicines are outlined above.  HOLD CHOLESTEROL MED WHILE ON MOLNUPIRAVIR Tests Ordered: Orders Placed This Encounter  Procedures   POC COVID-19 BinaxNow    Medication Changes: No orders of the defined types were placed in this encounter.   Follow Up:   in person  prn  Signed, Webb Silversmith, PA-C  11/17/2021 4:18 PM    [name]

## 2021-11-30 NOTE — Progress Notes (Signed)
Subjective:  Patient ID: Debra Mann, female    DOB: [birth year]  Age: 76 y.o. MRN: [id number]  Chief Complaint  Patient presents with   Hyperlipidemia   Hypothyroidism   HPI: Hyperlipidemia: Current medications: Rosuvastatin 20mg  take 1 tablet daily, Fish oil 1000mg  take 2 caps twice daily.  Hypothyroidism: Current medications: Levothyroxine 65mcg take 1 tablet daily.  Hypertensive heart disease with chronic systolic congestive heart failure: Current medications: Cozaar 50 mg daily. carvedilol 12.5 mg twice daily, and Lasix 20 mg prn.  Complaining of memory worsening.  This started when she had her cardiac arrest and had hypoxic brain injury in 09/2019.  Does seem to be progressing.   Current Outpatient Medications on File Prior to Visit  Medication Sig Dispense Refill   furosemide (LASIX) 20 MG tablet Take 1 tablet (20 mg total) by mouth daily as needed. 30 tablet 2   levothyroxine (SYNTHROID) 88 MCG tablet TAKE 1 TABLET(88 MCG) BY MOUTH DAILY 90 tablet 1   losartan (COZAAR) 50 MG tablet TAKE 1 TABLET(50 MG) BY MOUTH DAILY 90 tablet 0   Multiple Vitamin (MULTIVITAMIN) tablet Take 1 tablet by mouth daily.     Multiple Vitamins-Minerals (EYE VITAMINS PO) Take 1 tablet by mouth daily.     Omega-3 1000 MG CAPS Take 1 capsule by mouth daily.      rosuvastatin (CRESTOR) 20 MG tablet TAKE 1 TABLET(20 MG) BY MOUTH DAILY 90 tablet 0   acetaminophen (TYLENOL) 500 MG tablet Take 500 mg by mouth in the morning and at bedtime.     aspirin 81 MG EC tablet Take 81 mg by mouth daily.  (Patient not taking: Reported on 12/01/2021)     Current Facility-Administered Medications on File Prior to Visit  Medication Dose Route Frequency Provider Last Rate Last Admin   triamcinolone acetonide (KENALOG-40) injection 80 mg  80 mg Intra-articular Once Breck [place], Elnita Maxwell, MD       Past Medical History:  Diagnosis Date   Anoxic brain damage, not elsewhere classified ([place])    Cardiac arrest, cause unspecified  ([place])    Hyperlipidemia    Nonrheumatic mitral (valve) insufficiency    Presence of automatic (implantable) cardiac defibrillator    Spinal stenosis, lumbar region without neurogenic claudication    Thyroid disease    Unilateral primary osteoarthritis, left knee    Unqualified visual loss of both eyes    Past Surgical History:  Procedure Laterality Date   [place]  04/2019   PACEMAKER INSERTION  2004, 2013   2014 replaced  lead wires;with biventricular defibrillator: pericardial sac infusion   REVISION TOTAL HIP ARTHROPLASTY Left 03/2011   blood transfusion 03/2011   TONSILLECTOMY     TOTAL VAGINAL HYSTERECTOMY  1991    Family History  Problem Relation Age of Onset   Diabetes Mother    Transient ischemic attack Father    Social History   Socioeconomic History   Marital status: Married    Spouse name: Not on file   Number of children: 1   Years of education: Not on file   Highest education level: Not on file  Occupational History   Not on file  Tobacco Use   Smoking status: Never   Smokeless tobacco: Never  Vaping Use   Vaping Use: Never used  Substance and Sexual Activity   Alcohol use: Never   Drug use: Never   Sexual activity: Not on file  Other Topics Concern  Not on file  Social History Narrative   Not on file   Social Determinants of Health   Financial Resource Strain: Not on file  Food Insecurity: Not on file  Transportation Needs: Not on file  Physical Activity: Not on file  Stress: Not on file  Social Connections: Not on file    Review of Systems  Constitutional:  Positive for fatigue. Negative for appetite change and fever.  HENT:  Positive for congestion. Negative for ear pain, sinus pressure and sore throat.   Eyes:  Negative for pain.  Respiratory:  Positive for cough and shortness of breath. Negative for chest tightness and wheezing.   Cardiovascular:  Negative for chest pain and  palpitations.  Gastrointestinal:  Negative for abdominal pain, constipation, diarrhea, nausea and vomiting.  Endocrine: Negative for polydipsia, polyphagia and polyuria.  Genitourinary:  Negative for dysuria and hematuria.  Musculoskeletal:  Positive for arthralgias (bilateral knee stiffness) and back pain. Negative for joint swelling and myalgias.  Skin:  Negative for rash.  Neurological:  Negative for dizziness, weakness and headaches.  Psychiatric/Behavioral:  Negative for dysphoric mood. The patient is not nervous/anxious.     Objective:  BP 138/74    Pulse 75    Temp (!) 97.2 F (36.2 C)    Resp 18    Ht 5\' 2"  (1.575 m)    Wt 164 lb (74.4 kg)    BMI 30.00 kg/m   BP/Weight 12/01/2021 11/17/2021 1/60/1093  Systolic BP 235 - 573  Diastolic BP 74 - 68  Wt. (Lbs) 164 165 165.4  BMI 30 27.46 27.52    Physical Exam Vitals reviewed.  Constitutional:      Appearance: Normal appearance. She is normal weight.  Neck:     Vascular: No carotid bruit.  Cardiovascular:     Rate and Rhythm: Normal rate and regular rhythm.     Heart sounds: Normal heart sounds.  Pulmonary:     Effort: Pulmonary effort is normal. No respiratory distress.     Breath sounds: Normal breath sounds.  Abdominal:     General: Abdomen is flat. Bowel sounds are normal. There is distension.     Palpations: Abdomen is soft.     Tenderness: There is no abdominal tenderness.     Hernia: A hernia (umbilical) is present.     Comments: Fluid wave   Neurological:     Mental Status: She is alert and oriented to person, place, and time.     Comments: MMSE 30/30  Psychiatric:        Mood and Affect: Mood normal.        Behavior: Behavior normal.    Diabetic Foot Exam - Simple   No data filed      Lab Results  Component Value Date   WBC 5.8 05/26/2021   HGB 13.5 05/26/2021   HCT 41.8 05/26/2021   PLT 263 05/26/2021   GLUCOSE 101 (H) 05/26/2021   CHOL 156 05/26/2021   TRIG 133 05/26/2021   HDL 50 05/26/2021    LDLCALC 83 05/26/2021   ALT 13 05/26/2021   AST 20 05/26/2021   NA 138 05/26/2021   K 4.9 05/26/2021   CL 100 05/26/2021   CREATININE 0.71 05/26/2021   BUN 13 05/26/2021   CO2 23 05/26/2021   TSH 0.932 11/20/2020      Assessment & Plan:   Problem List Items Addressed This Visit       Cardiovascular and Mediastinum   Hypertensive heart disease with chronic  systolic congestive heart failure ([place])    Well controlled.  No changes to medicines.  Continue to work on eating a healthy diet and exercise.  Labs drawn today.        Relevant Medications   carvedilol (COREG) 12.5 MG tablet   Other Relevant Orders   CBC With Diff/Platelet   Comprehensive metabolic panel     Endocrine   Hypothyroidism    Check tsh.      Relevant Medications   carvedilol (COREG) 12.5 MG tablet   Other Relevant Orders   TSH     Other   Mixed hyperlipidemia - Primary    Well controlled.  No changes to medicines.  Continue to work on eating a healthy diet and exercise.  Labs drawn today.        Relevant Medications   carvedilol (COREG) 12.5 MG tablet   Other Relevant Orders   Lipid panel   Presence of automatic cardioverter/defibrillator (AICD)   Abdominal distension    Order ct abd/pelvis with contrast.       Relevant Orders   CT Abdomen Pelvis Wo Contrast   Ascites    Order ct abd/pelvis with contrast.       Relevant Orders   CT Abdomen Pelvis Wo Contrast   Memory loss   Relevant Orders   B12 and Folate Panel   Methylmalonic acid, serum   Need for influenza vaccination   Relevant Orders   Flu Vaccine QUAD High Dose(Fluad) (Completed)  .  Meds ordered this encounter  Medications   carvedilol (COREG) 12.5 MG tablet    Sig: TAKE 1 TABLET(12.5 MG) BY MOUTH TWICE DAILY WITH A MEAL    Dispense:  180 tablet    Refill:  3    Orders Placed This Encounter  Procedures   CT Abdomen Pelvis Wo Contrast   Flu Vaccine QUAD High Dose(Fluad)   CBC With Diff/Platelet    Comprehensive metabolic panel   Lipid panel   TSH   B12 and Folate Panel   Methylmalonic acid, serum     Follow-up: No follow-ups on file.  An After Visit Summary was printed and given to the patient.  [name], MD [facility] [phone]

## 2021-12-01 ENCOUNTER — Ambulatory Visit (INDEPENDENT_AMBULATORY_CARE_PROVIDER_SITE_OTHER): Payer: HMO | Admitting: Family Medicine

## 2021-12-01 ENCOUNTER — Other Ambulatory Visit: Payer: Self-pay

## 2021-12-01 ENCOUNTER — Encounter: Payer: Self-pay | Admitting: Family Medicine

## 2021-12-01 VITALS — BP 138/74 | HR 75 | Temp 97.2°F | Resp 18 | Ht 62.0 in | Wt 164.0 lb

## 2021-12-01 DIAGNOSIS — Z23 Encounter for immunization: Secondary | ICD-10-CM | POA: Diagnosis not present

## 2021-12-01 DIAGNOSIS — R140 Abdominal distension (gaseous): Secondary | ICD-10-CM | POA: Diagnosis not present

## 2021-12-01 DIAGNOSIS — I110 Hypertensive heart disease with heart failure: Secondary | ICD-10-CM | POA: Diagnosis not present

## 2021-12-01 DIAGNOSIS — E039 Hypothyroidism, unspecified: Secondary | ICD-10-CM

## 2021-12-01 DIAGNOSIS — I5022 Chronic systolic (congestive) heart failure: Secondary | ICD-10-CM | POA: Diagnosis not present

## 2021-12-01 DIAGNOSIS — R413 Other amnesia: Secondary | ICD-10-CM

## 2021-12-01 DIAGNOSIS — Z95810 Presence of automatic (implantable) cardiac defibrillator: Secondary | ICD-10-CM

## 2021-12-01 DIAGNOSIS — E782 Mixed hyperlipidemia: Secondary | ICD-10-CM

## 2021-12-01 DIAGNOSIS — R188 Other ascites: Secondary | ICD-10-CM | POA: Diagnosis not present

## 2021-12-01 MED ORDER — CARVEDILOL 12.5 MG PO TABS: ORAL_TABLET | ORAL | 3 refills | Status: AC

## 2021-12-01 NOTE — Assessment & Plan Note (Signed)
Order ct abd/pelvis with contrast.

## 2021-12-01 NOTE — Assessment & Plan Note (Signed)
Check tsh 

## 2021-12-01 NOTE — Assessment & Plan Note (Signed)
Well controlled.  ?No changes to medicines.  ?Continue to work on eating a healthy diet and exercise.  ?Labs drawn today.  ?

## 2021-12-07 DIAGNOSIS — E039 Hypothyroidism, unspecified: Secondary | ICD-10-CM | POA: Diagnosis not present

## 2021-12-07 DIAGNOSIS — Z95810 Presence of automatic (implantable) cardiac defibrillator: Secondary | ICD-10-CM | POA: Diagnosis not present

## 2021-12-07 DIAGNOSIS — I10 Essential (primary) hypertension: Secondary | ICD-10-CM | POA: Diagnosis not present

## 2021-12-07 DIAGNOSIS — Z8674 Personal history of sudden cardiac arrest: Secondary | ICD-10-CM | POA: Diagnosis not present

## 2021-12-07 DIAGNOSIS — I340 Nonrheumatic mitral (valve) insufficiency: Secondary | ICD-10-CM | POA: Diagnosis not present

## 2021-12-07 DIAGNOSIS — I428 Other cardiomyopathies: Secondary | ICD-10-CM | POA: Diagnosis not present

## 2021-12-07 DIAGNOSIS — I208 Other forms of angina pectoris: Secondary | ICD-10-CM | POA: Diagnosis not present

## 2021-12-07 DIAGNOSIS — I4729 Other ventricular tachycardia: Secondary | ICD-10-CM | POA: Diagnosis not present

## 2021-12-07 LAB — CBC WITH DIFF/PLATELET
Basophils Absolute: 0 10*3/uL (ref 0.0–0.2)
Basos: 1 %
EOS (ABSOLUTE): 0.2 10*3/uL (ref 0.0–0.4)
Eos: 3 %
Hematocrit: 37.6 % (ref 34.0–46.6)
Hemoglobin: 12.7 g/dL (ref 11.1–15.9)
Immature Grans (Abs): 0 10*3/uL (ref 0.0–0.1)
Immature Granulocytes: 0 %
Lymphocytes Absolute: 1.4 10*3/uL (ref 0.7–3.1)
Lymphs: 28 %
MCH: 30.8 pg (ref 26.6–33.0)
MCHC: 33.8 g/dL (ref 31.5–35.7)
MCV: 91 fL (ref 79–97)
Monocytes Absolute: 0.8 10*3/uL (ref 0.1–0.9)
Monocytes: 15 %
Neutrophils Absolute: 2.6 10*3/uL (ref 1.4–7.0)
Neutrophils: 53 %
Platelets: 330 10*3/uL (ref 150–450)
RBC: 4.12 x10E6/uL (ref 3.77–5.28)
RDW: 12 % (ref 11.7–15.4)
WBC: 4.9 10*3/uL (ref 3.4–10.8)

## 2021-12-07 LAB — CARDIOVASCULAR RISK ASSESSMENT

## 2021-12-07 LAB — TSH: TSH: 2.31 u[IU]/mL (ref 0.450–4.500)

## 2021-12-07 LAB — COMPREHENSIVE METABOLIC PANEL
ALT: 13 IU/L (ref 0–32)
AST: 18 IU/L (ref 0–40)
Albumin/Globulin Ratio: 1.8 (ref 1.2–2.2)
Albumin: 3.9 g/dL (ref 3.7–4.7)
Alkaline Phosphatase: 81 IU/L (ref 44–121)
BUN/Creatinine Ratio: 15 (ref 12–28)
BUN: 11 mg/dL (ref 8–27)
Bilirubin Total: 0.4 mg/dL (ref 0.0–1.2)
CO2: 23 mmol/L (ref 20–29)
Calcium: 9.6 mg/dL (ref 8.7–10.3)
Chloride: 104 mmol/L (ref 96–106)
Creatinine, Ser: 0.71 mg/dL (ref 0.57–1.00)
Globulin, Total: 2.2 g/dL (ref 1.5–4.5)
Glucose: 104 mg/dL — ABNORMAL HIGH (ref 70–99)
Potassium: 4.5 mmol/L (ref 3.5–5.2)
Sodium: 139 mmol/L (ref 134–144)
Total Protein: 6.1 g/dL (ref 6.0–8.5)
eGFR: 89 mL/min/{1.73_m2} (ref >59)

## 2021-12-07 LAB — LIPID PANEL
Chol/HDL Ratio: 2.9 ratio (ref 0.0–4.4)
Cholesterol, Total: 146 mg/dL (ref 100–199)
HDL: 51 mg/dL (ref >39)
LDL Chol Calc (NIH): 78 mg/dL (ref 0–99)
Triglycerides: 90 mg/dL (ref 0–149)
VLDL Cholesterol Cal: 17 mg/dL (ref 5–40)

## 2021-12-07 LAB — B12 AND FOLATE PANEL
Folate: 17.1 ng/mL (ref >3.0)
Vitamin B-12: 791 pg/mL (ref 232–1245)

## 2021-12-07 LAB — METHYLMALONIC ACID, SERUM: Methylmalonic Acid: 217 nmol/L (ref 0–378)

## 2021-12-10 ENCOUNTER — Ambulatory Visit (HOSPITAL_BASED_OUTPATIENT_CLINIC_OR_DEPARTMENT_OTHER)
Admission: RE | Admit: 2021-12-10 | Discharge: 2021-12-10 | Disposition: A | Discharge status: Home / Self Care | Payer: PPO | Source: Ambulatory Visit | Attending: Family Medicine | Admitting: Family Medicine

## 2021-12-10 ENCOUNTER — Other Ambulatory Visit: Payer: Self-pay

## 2021-12-10 DIAGNOSIS — R188 Other ascites: Secondary | ICD-10-CM | POA: Insufficient documentation

## 2021-12-10 DIAGNOSIS — I700 Atherosclerosis of aorta: Secondary | ICD-10-CM | POA: Diagnosis not present

## 2021-12-10 DIAGNOSIS — R140 Abdominal distension (gaseous): Secondary | ICD-10-CM | POA: Insufficient documentation

## 2021-12-10 DIAGNOSIS — K5900 Constipation, unspecified: Secondary | ICD-10-CM | POA: Diagnosis not present

## 2021-12-14 ENCOUNTER — Other Ambulatory Visit: Payer: Self-pay

## 2021-12-14 MED ORDER — ROSUVASTATIN CALCIUM 20 MG PO TABS: ORAL_TABLET | ORAL | 0 refills | Status: DC

## 2022-01-27 ENCOUNTER — Other Ambulatory Visit
Admission: RE | Admit: 2022-01-27 | Discharge: 2022-01-27 | Disposition: A | Discharge status: Home / Self Care | Payer: Medicare (Managed Care) | Source: Ambulatory Visit | Attending: Primary Care | Admitting: Primary Care

## 2022-01-27 ENCOUNTER — Other Ambulatory Visit: Payer: Self-pay | Admitting: Gastroenterology

## 2022-01-27 ENCOUNTER — Other Ambulatory Visit: Payer: Self-pay

## 2022-01-27 DIAGNOSIS — E559 Vitamin D deficiency, unspecified: Secondary | ICD-10-CM | POA: Insufficient documentation

## 2022-01-27 DIAGNOSIS — F1190 Opioid use, unspecified, uncomplicated: Secondary | ICD-10-CM | POA: Insufficient documentation

## 2022-01-27 DIAGNOSIS — I10 Essential (primary) hypertension: Secondary | ICD-10-CM | POA: Insufficient documentation

## 2022-01-27 DIAGNOSIS — E7800 Pure hypercholesterolemia, unspecified: Secondary | ICD-10-CM | POA: Insufficient documentation

## 2022-01-27 DIAGNOSIS — Z79899 Other long term (current) drug therapy: Secondary | ICD-10-CM | POA: Insufficient documentation

## 2022-01-27 LAB — COMPREHENSIVE METABOLIC PANEL
ALT: <5 U/L (ref 0–35)
AST: 15 U/L (ref 0–35)
Albumin: 4.1 g/dL (ref 3.5–5.2)
Alk Phos: 70 U/L (ref 35–105)
Anion Gap: 10 (ref 7–16)
Bilirubin,Total: 0.2 mg/dL (ref 0.0–1.2)
CO2: 27 mmol/L (ref 20–28)
Calcium: 9.8 mg/dL (ref 8.6–10.2)
Chloride: 103 mmol/L (ref 96–108)
Creatinine: 0.69 mg/dL (ref 0.51–0.95)
Glucose: 83 mg/dL (ref 60–99)
Lab: 9 mg/dL (ref 6–20)
Potassium: 4.3 mmol/L (ref 3.3–4.6)
Sodium: 140 mmol/L (ref 133–145)
Total Protein: 7.5 g/dL (ref 6.3–7.7)
eGFR BY CREAT: 90 *

## 2022-01-27 LAB — CBC
Hematocrit: 48 % — ABNORMAL HIGH (ref 34–45)
Hemoglobin: 15.4 g/dL (ref 11.2–15.7)
MCH: 28 pg (ref 26–32)
MCHC: 32 g/dL (ref 32–36)
MCV: 87 fL (ref 79–95)
Platelets: 211 10*3/uL (ref 160–370)
RBC: 5.5 MIL/uL — ABNORMAL HIGH (ref 3.9–5.2)
RDW: 15.9 % — ABNORMAL HIGH (ref 11.7–14.4)
WBC: 5.1 10*3/uL (ref 4.0–10.0)

## 2022-01-27 LAB — LIPID PANEL
Chol/HDL Ratio: 2.7
Cholesterol: 215 mg/dL — AB
HDL: 79 mg/dL — ABNORMAL HIGH (ref 40–60)
LDL Calculated: 115 mg/dL
Non HDL Cholesterol: 136 mg/dL
Triglycerides: 104 mg/dL

## 2022-01-27 LAB — TSH: TSH: 1.5 u[IU]/mL (ref 0.27–4.20)

## 2022-01-27 LAB — VITAMIN D: 25-OH Vit Total: 36 ng/mL (ref 30–60)

## 2022-01-27 LAB — UNABLE TO VOID

## 2022-02-01 ENCOUNTER — Other Ambulatory Visit: Payer: Self-pay

## 2022-02-01 DIAGNOSIS — I5022 Chronic systolic (congestive) heart failure: Secondary | ICD-10-CM

## 2022-02-01 MED ORDER — LOSARTAN POTASSIUM 50 MG PO TABS: ORAL_TABLET | ORAL | 0 refills | Status: DC

## 2022-02-01 MED ORDER — LEVOTHYROXINE SODIUM 88 MCG PO TABS: ORAL_TABLET | ORAL | 1 refills | Status: DC

## 2022-02-08 DIAGNOSIS — Z1231 Encounter for screening mammogram for malignant neoplasm of breast: Secondary | ICD-10-CM | POA: Diagnosis not present

## 2022-02-08 LAB — HM MAMMOGRAPHY

## 2022-02-13 NOTE — Progress Notes (Deleted)
Subjective:   Debra Mann is a 76 y.o. female who presents for Medicare Annual (Subsequent) preventive examination.  This wellness visit is conducted by a nurse.  The patient's medications were reviewed and reconciled since the patient's last visit.  History details were provided by the patient.  The history appears to be reliable.    Patient's last AWV was *** year ago.   Medical History: Patient history and Family history was reviewed  Medications, Allergies, and preventative health maintenance was reviewed and updated.         Objective:    There were no vitals filed for this visit. There is no height or weight on file to calculate BMI.     10/28/2020    1:58 PM  Advanced Directives  Does Patient Have a Medical Advance Directive? Yes  Type of Estate agent of [place];Living will  Copy of Healthcare Power of Attorney in Chart? No - copy requested    Current Medications (verified) Outpatient Encounter Medications as of 02/15/2022  Medication Sig  . acetaminophen (TYLENOL) 500 MG tablet Take 500 mg by mouth in the morning and at bedtime.  Marland Kitchen aspirin 81 MG EC tablet Take 81 mg by mouth daily.  (Patient not taking: Reported on 12/01/2021)  . carvedilol (COREG) 12.5 MG tablet TAKE 1 TABLET(12.5 MG) BY MOUTH TWICE DAILY WITH A MEAL  . furosemide (LASIX) 20 MG tablet Take 1 tablet (20 mg total) by mouth daily as needed.  Marland Kitchen levothyroxine (SYNTHROID) 88 MCG tablet TAKE 1 TABLET(88 MCG) BY MOUTH DAILY  . losartan (COZAAR) 50 MG tablet TAKE 1 TABLET(50 MG) BY MOUTH DAILY  . Multiple Vitamin (MULTIVITAMIN) tablet Take 1 tablet by mouth daily.  . Multiple Vitamins-Minerals (EYE VITAMINS PO) Take 1 tablet by mouth daily.  . Omega-3 1000 MG CAPS Take 1 capsule by mouth daily.   . rosuvastatin (CRESTOR) 20 MG tablet TAKE 1 TABLET(20 MG) BY MOUTH DAILY   Facility-Administered Encounter Medications as of 02/15/2022  Medication  . triamcinolone acetonide (KENALOG-40)  injection 80 mg    Allergies (verified) Atorvastatin   History: Past Medical History:  Diagnosis Date  . Anoxic brain damage, not elsewhere classified ([facility])   . Cardiac arrest, cause unspecified ([facility])   . Hyperlipidemia   . Nonrheumatic mitral (valve) insufficiency   . Presence of automatic (implantable) cardiac defibrillator   . Spinal stenosis, lumbar region without neurogenic claudication   . Thyroid disease   . Unilateral primary osteoarthritis, left knee   . Unqualified visual loss of both eyes    Past Surgical History:  Procedure Laterality Date  . ABDOMINAL HYSTERECTOMY    . APPENDECTOMY  1991  . LUMBAR FUSION  04/2019  . PACEMAKER INSERTION  2004, 2013   2014 replaced  lead wires;with biventricular defibrillator: pericardial sac infusion  . REVISION TOTAL HIP ARTHROPLASTY Left 03/2011   blood transfusion 03/2011  . TONSILLECTOMY    . TOTAL VAGINAL HYSTERECTOMY  1991   Family History  Problem Relation Age of Onset  . Diabetes Mother   . Transient ischemic attack Father    Social History   Socioeconomic History  . Marital status: Married    Spouse name: Not on file  . Number of children: 1  . Years of education: Not on file  . Highest education level: Not on file  Occupational History  . Not on file  Tobacco Use  . Smoking status: Never  . Smokeless tobacco: Never  Vaping Use  . Vaping  Use: Never used  Substance and Sexual Activity  . Alcohol use: Never  . Drug use: Never  . Sexual activity: Not on file  Other Topics Concern  . Not on file  Social History Narrative  . Not on file   Social Determinants of Health   Financial Resource Strain: Not on file  Food Insecurity: Not on file  Transportation Needs: Not on file  Physical Activity: Not on file  Stress: Not on file  Social Connections: Not on file    Tobacco Counseling Counseling given: Not Answered   Clinical Intake:  Pre-visit preparation completed: Yes        Diabetes:  No     Diabetic?***  Interpreter Needed?: No      Activities of Daily Living     View : No data to display.           Patient Care Team: Blane Ohara, MD as PCP - General (Family Medicine) Earvin Hansen, [facility] (Inactive) as Pharmacist (Pharmacist)  Indicate any recent Medical Services you may have received from other than [facility] providers in the past year (date may be approximate).     Assessment:   This is a routine wellness examination for Debra Mann.  Hearing/Vision screen No results found.  Dietary issues and exercise activities discussed:     Goals Addressed   None   Depression Screen    12/01/2021    7:40 AM 11/20/2020    7:34 AM 10/28/2020    1:59 PM 03/06/2020   10:56 AM 02/18/2020    7:40 AM  PHQ 2/9 Scores  PHQ - 2 Score 0 0 0 0 0    Fall Risk    12/01/2021    7:39 AM 05/26/2021    7:57 AM 11/20/2020    7:34 AM 10/28/2020    1:59 PM 05/21/2020    7:36 AM  Fall Risk   Falls in the past year? 1 0 0 0   Number falls in past yr: 0 0 0 0 1  Injury with Fall? 1 0 0 0 0  Follow up Falls evaluation completed        FALL RISK PREVENTION PERTAINING TO THE HOME:  Any stairs in or around the home? {YES/NO:21197} If so, are there any without handrails? {YES/NO:21197} Home free of loose throw rugs in walkways, pet beds, electrical cords, etc? {YES/NO:21197} Adequate lighting in your home to reduce risk of falls? {YES/NO:21197}  ASSISTIVE DEVICES UTILIZED TO PREVENT FALLS:  Life alert? {YES/NO:21197} Use of a cane, walker or w/c? {YES/NO:21197} Grab bars in the bathroom? {YES/NO:21197} Shower chair or bench in shower? {YES/NO:21197} Elevated toilet seat or a handicapped toilet? {YES/NO:21197}  TIMED UP AND GO:  Was the test performed? {YES/NO:21197}.  Length of time to ambulate 10 feet: *** sec.   {Appearance of ZOXW:9604540}  Cognitive Function:    12/01/2021    8:17 AM  MMSE - Mini Mental State Exam  Orientation to time 5  Orientation to Place 5   Registration 3  Attention/ Calculation 5  Recall 3  Language- name 2 objects 2  Language- repeat 1  Language- follow 3 step command 3  Language- read & follow direction 1  Write a sentence 1  Copy design 1  Total score 30        10/28/2020    2:01 PM  6CIT Screen  What Year? 0 points  What month? 0 points  What time? 0 points  Count back from 20 0 points  Months  in reverse 0 points  Repeat phrase 0 points  Total Score 0 points    Immunizations Immunization History  Administered Date(s) Administered  . Fluad Quad(high Dose 65+) 07/07/2020, 12/01/2021  . Influenza Split 08/17/2013, 11/18/2014, 08/01/2018, 07/26/2019  . Influenza, High Dose Seasonal PF 06/06/2016, 07/03/2017, 07/07/2020  . Influenza, Seasonal, Injecte, Preservative Fre 11/18/2014  . Influenza-Unspecified 08/17/2013, 08/01/2018, 07/26/2019  . Moderna SARS-COV2 Booster Vaccination 02/10/2021  . Moderna Sars-Covid-2 Vaccination 12/16/2019, 01/13/2020  . Pneumococcal Conjugate-13 03/17/2016  . Pneumococcal Polysaccharide-23 10/22/2012    {TDAP status:2101805}  {Flu Vaccine status:2101806}  {Pneumococcal vaccine status:2101807}  {Covid-19 vaccine status:2101808}  Qualifies for Shingles Vaccine? {YES/NO:21197}  Zostavax completed {YES/NO:21197}  {Shingrix Completed?:2101804}  Screening Tests Health Maintenance  Topic Date Due  . Hepatitis C Screening  Never done  . TETANUS/TDAP  Never done  . Zoster Vaccines- Shingrix (1 of 2) Never done  . COVID-19 Vaccine (3 - Moderna risk series) 03/10/2021  . INFLUENZA VACCINE  05/17/2022  . COLONOSCOPY (Pts 45-53yrs Insurance coverage will need to be confirmed)  10/13/2030  . Pneumonia Vaccine 33+ Years old  Completed  . DEXA SCAN  Completed  . HPV VACCINES  Aged Out  . Fecal DNA (Cologuard)  Discontinued    Health Maintenance  Health Maintenance Due  Topic Date Due  . Hepatitis C Screening  Never done  . TETANUS/TDAP  Never done  . Zoster  Vaccines- Shingrix (1 of 2) Never done  . COVID-19 Vaccine (3 - Moderna risk series) 03/10/2021    {Colorectal cancer screening:2101809}  {Mammogram status:21018020}  {Bone Density status:21018021}  Lung Cancer Screening: (Low Dose CT Chest recommended if Age 84-80 years, 30 pack-year currently smoking OR have quit w/in 15years.) {DOES NOT does:27190::"does not"} qualify.   Lung Cancer Screening Referral: ***  Additional Screening:  Hepatitis C Screening: {DOES NOT does:27190::"does not"} qualify; Completed ***  Vision Screening: Recommended annual ophthalmology exams for early detection of glaucoma and other disorders of the eye. Is the patient up to date with their annual eye exam?  {YES/NO:21197} Who is the provider or what is the name of the office in which the patient attends annual eye exams? *** If pt is not established with a provider, would they like to be referred to a provider to establish care? {YES/NO:21197}.   Dental Screening: Recommended annual dental exams for proper oral hygiene  Community Resource Referral / Chronic Care Management: CRR required this visit?  {YES/NO:21197}  CCM required this visit?  {YES/NO:21197}     Plan:     I have personally reviewed and noted the following in the patient's chart:   Medical and social history Use of alcohol, tobacco or illicit drugs  Current medications and supplements including opioid prescriptions.  Functional ability and status Nutritional status Physical activity Advanced directives List of other physicians Hospitalizations, surgeries, and ER visits in previous 12 months Vitals Screenings to include cognitive, depression, and falls Referrals and appointments  In addition, I have reviewed and discussed with patient certain preventive protocols, quality metrics, and best practice recommendations. A written personalized care plan for preventive services as well as general preventive health recommendations were  provided to patient.     Jacklynn Bue, LPN   4/0/1027   Nurse Notes: ***      This encounter was created in error - please disregard.

## 2022-02-13 NOTE — Patient Instructions (Signed)

## 2022-02-15 ENCOUNTER — Telehealth: Payer: Self-pay

## 2022-02-15 ENCOUNTER — Encounter: Payer: Self-pay | Admitting: Family Medicine

## 2022-02-15 DIAGNOSIS — Z0000 Encounter for general adult medical examination without abnormal findings: Secondary | ICD-10-CM

## 2022-02-15 NOTE — Telephone Encounter (Signed)
Patient was scheduled for an AWV via telephone.  I attempted to reach out to her; she did not answer, VM left to return call. ?

## 2022-02-18 ENCOUNTER — Other Ambulatory Visit: Payer: Self-pay

## 2022-02-18 MED ORDER — LEVOTHYROXINE SODIUM 88 MCG PO TABS: ORAL_TABLET | ORAL | 1 refills | Status: DC

## 2022-03-31 ENCOUNTER — Other Ambulatory Visit: Payer: Self-pay | Admitting: Gastroenterology

## 2022-04-25 DIAGNOSIS — L821 Other seborrheic keratosis: Secondary | ICD-10-CM | POA: Diagnosis not present

## 2022-04-25 DIAGNOSIS — D224 Melanocytic nevi of scalp and neck: Secondary | ICD-10-CM | POA: Diagnosis not present

## 2022-04-25 DIAGNOSIS — L814 Other melanin hyperpigmentation: Secondary | ICD-10-CM | POA: Diagnosis not present

## 2022-04-25 DIAGNOSIS — L578 Other skin changes due to chronic exposure to nonionizing radiation: Secondary | ICD-10-CM | POA: Diagnosis not present

## 2022-04-25 DIAGNOSIS — L570 Actinic keratosis: Secondary | ICD-10-CM | POA: Diagnosis not present

## 2022-04-28 ENCOUNTER — Other Ambulatory Visit: Payer: Self-pay

## 2022-04-28 MED ORDER — ROSUVASTATIN CALCIUM 20 MG PO TABS: ORAL_TABLET | ORAL | 0 refills | Status: DC

## 2022-05-04 LAB — UNMAPPED LAB RESULTS
Basophil # (HT): 0.1 10 3/uL (ref 0.0–0.2)
Basophil % (HT): 1 % (ref 0–2)
Eosinophil # (HT): 0.1 10 3/uL (ref 0.0–0.5)
Eosinophil % (HT): 1 % (ref 0–7)
Hematocrit (HT): 48 % — ABNORMAL HIGH (ref 34–47)
Hemoglobin (HGB) (HT): 15.9 g/dL (ref 11.5–16.0)
Lymphocyte # (HT): 1.7 10 3/uL (ref 0.9–3.8)
Lymphocyte % (HT): 28 % (ref 17–44)
MCHC (HT): 33.4 g/dL (ref 32.0–36.0)
MCV (HT): 87 fL (ref 81.0–99.0)
Mean Corpuscular Hemoglobin (MCH) (HT): 29.1 pg (ref 26.0–34.0)
Monocyte # (HT): 0.5 10 3/uL (ref 0.2–1.0)
Monocyte % (HT): 8 % (ref 4–12)
Neutrophil # (HT): 3.7 10 3/uL (ref 1.5–7.7)
Platelets (HT): 238 10 3/uL (ref 140–400)
RBC (HT): 5.47 10 6/uL — ABNORMAL HIGH (ref 3.80–5.20)
RDW (HT): 14.7 % (ref 11.5–15.0)
Seg Neut % (HT): 62 % (ref 40–75)
WBC (HT): 6 10 3/uL (ref 4.0–10.8)

## 2022-05-05 ENCOUNTER — Other Ambulatory Visit: Payer: Self-pay | Admitting: Physician Assistant

## 2022-05-05 DIAGNOSIS — Z13820 Encounter for screening for osteoporosis: Secondary | ICD-10-CM

## 2022-05-05 DIAGNOSIS — F17210 Nicotine dependence, cigarettes, uncomplicated: Secondary | ICD-10-CM

## 2022-05-10 ENCOUNTER — Ambulatory Visit: Payer: Medicare (Managed Care) | Admitting: Radiology

## 2022-05-10 ENCOUNTER — Ambulatory Visit: Payer: Medicare (Managed Care)

## 2022-05-16 DIAGNOSIS — Z4502 Encounter for adjustment and management of automatic implantable cardiac defibrillator: Secondary | ICD-10-CM | POA: Diagnosis not present

## 2022-05-16 DIAGNOSIS — I4729 Other ventricular tachycardia: Secondary | ICD-10-CM | POA: Diagnosis not present

## 2022-05-16 DIAGNOSIS — H353132 Nonexudative age-related macular degeneration, bilateral, intermediate dry stage: Secondary | ICD-10-CM | POA: Diagnosis not present

## 2022-06-10 ENCOUNTER — Other Ambulatory Visit: Payer: Self-pay | Admitting: Family Medicine

## 2022-06-10 DIAGNOSIS — I5022 Chronic systolic (congestive) heart failure: Secondary | ICD-10-CM

## 2022-08-11 ENCOUNTER — Other Ambulatory Visit: Payer: Self-pay

## 2022-08-11 MED ORDER — LEVOTHYROXINE SODIUM 88 MCG PO TABS: ORAL_TABLET | ORAL | 0 refills | Status: DC

## 2022-09-05 DIAGNOSIS — Z4502 Encounter for adjustment and management of automatic implantable cardiac defibrillator: Secondary | ICD-10-CM | POA: Diagnosis not present

## 2022-09-05 DIAGNOSIS — I428 Other cardiomyopathies: Secondary | ICD-10-CM | POA: Diagnosis not present

## 2022-09-15 ENCOUNTER — Other Ambulatory Visit: Payer: Self-pay

## 2022-09-15 ENCOUNTER — Ambulatory Visit (INDEPENDENT_AMBULATORY_CARE_PROVIDER_SITE_OTHER): Payer: PPO | Admitting: Nurse Practitioner

## 2022-09-15 ENCOUNTER — Encounter: Payer: Self-pay | Admitting: Nurse Practitioner

## 2022-09-15 VITALS — BP 130/80 | HR 76 | Temp 97.5°F | Ht 62.0 in | Wt 171.0 lb

## 2022-09-15 DIAGNOSIS — J018 Other acute sinusitis: Secondary | ICD-10-CM

## 2022-09-15 DIAGNOSIS — R051 Acute cough: Secondary | ICD-10-CM | POA: Diagnosis not present

## 2022-09-15 LAB — POCT INFLUENZA A/B
Influenza A, POC: NEGATIVE
Influenza B, POC: NEGATIVE

## 2022-09-15 LAB — POCT RESPIRATORY SYNCYTIAL VIRUS: RSV Rapid Ag: NEGATIVE

## 2022-09-15 LAB — POC COVID19 BINAXNOW: SARS Coronavirus 2 Ag: NEGATIVE

## 2022-09-15 MED ORDER — AZITHROMYCIN 250 MG PO TABS: ORAL_TABLET | ORAL | 0 refills | Status: AC

## 2022-09-15 MED ORDER — BENZONATATE 100 MG PO CAPS: 100.0000 mg | ORAL_CAPSULE | Freq: Two times a day (BID) | ORAL | 0 refills | Status: AC | PRN

## 2022-09-15 MED ORDER — ROSUVASTATIN CALCIUM 20 MG PO TABS: ORAL_TABLET | ORAL | 0 refills | Status: DC

## 2022-09-15 NOTE — Patient Instructions (Signed)
Rest and push fluids Take Z-pack as prescribed Use Tessalon perles as needed for cough Take Mucinex every 12 hours Follow-up as needed  Sinus Infection, Adult A sinus infection is soreness and swelling (inflammation) of your sinuses. Sinuses are hollow spaces in the bones around your face. They are located: Around your eyes. In the middle of your forehead. Behind your nose. In your cheekbones. Your sinuses and nasal passages are lined with a fluid called mucus. Mucus drains out of your sinuses. Swelling can trap mucus in your sinuses. This lets germs (bacteria, virus, or fungus) grow, which leads to infection. Most of the time, this condition is caused by a virus. What are the causes? Allergies. Asthma. Germs. Things that block your nose or sinuses. Growths in the nose (nasal polyps). Chemicals or irritants in the air. A fungus. This is rare. What increases the risk? Having a weak body defense system (immune system). Doing a lot of swimming or diving. Using nasal sprays too much. Smoking. What are the signs or symptoms? The main symptoms of this condition are pain and a feeling of pressure around the sinuses. Other symptoms include: Stuffy nose (congestion). This may make it hard to breathe through your nose. Runny nose (drainage). Soreness, swelling, and warmth in the sinuses. A cough that may get worse at night. Being unable to smell and taste. Mucus that collects in the throat or the back of the nose (postnasal drip). This may cause a sore throat or bad breath. Being very tired (fatigued). A fever. How is this diagnosed? Your symptoms. Your medical history. A physical exam. Tests to find out if your condition is short-term (acute) or long-term (chronic). Your doctor may: Check your nose for growths (polyps). Check your sinuses using a tool that has a light on one end (endoscope). Check for allergies or germs. Do imaging tests, such as an MRI or CT scan. How is this  treated? Treatment for this condition depends on the cause and whether it is short-term or long-term. If caused by a virus, your symptoms should go away on their own within 10 days. You may be given medicines to relieve symptoms. They include: Medicines that shrink swollen tissue in the nose. A spray that treats swelling of the nostrils. Rinses that help get rid of thick mucus in your nose (nasal saline washes). Medicines that treat allergies (antihistamines). Over-the-counter pain relievers. If caused by bacteria, your doctor may wait to see if you will get better without treatment. You may be given antibiotic medicine if you have: A very bad infection. A weak body defense system. If caused by growths in the nose, surgery may be needed. Follow these instructions at home: Medicines Take, use, or apply over-the-counter and prescription medicines only as told by your doctor. These may include nasal sprays. If you were prescribed an antibiotic medicine, take it as told by your doctor. Do not stop taking it even if you start to feel better. Hydrate and humidify  Drink enough water to keep your pee (urine) pale yellow. Use a cool mist humidifier to keep the humidity level in your home above 50%. Breathe in steam for 10-15 minutes, 3-4 times a day, or as told by your doctor. You can do this in the bathroom while a hot shower is running. Try not to spend time in cool or dry air. Rest Rest as much as you can. Sleep with your head raised (elevated). Make sure you get enough sleep each night. General instructions  Put a warm,  moist washcloth on your face 3-4 times a day, or as often as told by your doctor. Use nasal saline washes as often as told by your doctor. Wash your hands often with soap and water. If you cannot use soap and water, use hand sanitizer. Do not smoke. Avoid being around people who are smoking (secondhand smoke). Keep all follow-up visits. Contact a doctor if: You have a  fever. Your symptoms get worse. Your symptoms do not get better within 10 days. Get help right away if: You have a very bad headache. You cannot stop vomiting. You have very bad pain or swelling around your face or eyes. You have trouble seeing. You feel confused. Your neck is stiff. You have trouble breathing. These symptoms may be an emergency. Get help right away. Call 911. Do not wait to see if the symptoms will go away. Do not drive yourself to the hospital. Summary A sinus infection is swelling of your sinuses. Sinuses are hollow spaces in the bones around your face. This condition is caused by tissues in your nose that become inflamed or swollen. This traps germs. These can lead to infection. If you were prescribed an antibiotic medicine, take it as told by your doctor. Do not stop taking it even if you start to feel better. Keep all follow-up visits. This information is not intended to replace advice given to you by your health care provider. Make sure you discuss any questions you have with your health care provider. Document Revised: 09/07/2021 Document Reviewed: 09/07/2021 Elsevier Patient Education  [employer].

## 2022-09-15 NOTE — Progress Notes (Signed)
Acute Office Visit  Subjective:    Patient ID: Debra Mann, female    DOB: 01-16-1946, 76 y.o.   MRN: [id number]  Chief Complaint  Patient presents with   URI    HPI: Patient is in today for Upper respiratory symptoms She complains of congestion, nasal congestion, productive cough with  yellow and green colored sputum, and sore throat.Denies fever, chills, night sweats or weight loss. Onset of symptoms was a few days ago and staying constant.She is drinking plenty of fluids.  Past history is significant for pneumonia. Patient is non-smoker. Treatment has included OTC cough syrup.   Past Medical History:  Diagnosis Date   Anoxic brain damage, not elsewhere classified ([place])    Cardiac arrest, cause unspecified ([place])    Hyperlipidemia    Nonrheumatic mitral (valve) insufficiency    Presence of automatic (implantable) cardiac defibrillator    Spinal stenosis, lumbar region without neurogenic claudication    Thyroid disease    Unilateral primary osteoarthritis, left knee    Unqualified visual loss of both eyes     Past Surgical History:  Procedure Laterality Date   ABDOMINAL HYSTERECTOMY     APPENDECTOMY  1991   LUMBAR FUSION  04/2019   PACEMAKER INSERTION  2004, 2013   2014 replaced  lead wires;with biventricular defibrillator: pericardial sac infusion   REVISION TOTAL HIP ARTHROPLASTY Left 03/2011   blood transfusion 03/2011   TONSILLECTOMY     TOTAL VAGINAL HYSTERECTOMY  1991    Family History  Problem Relation Age of Onset   Diabetes Mother    Transient ischemic attack Father     Social History   Socioeconomic History   Marital status: Married    Spouse name: Not on file   Number of children: 1   Years of education: Not on file   Highest education level: Not on file  Occupational History   Not on file  Tobacco Use   Smoking status: Never   Smokeless tobacco: Never  Vaping Use   Vaping Use: Never used  Substance and Sexual Activity   Alcohol use:  Never   Drug use: Never   Sexual activity: Not on file  Other Topics Concern   Not on file  Social History Narrative   Not on file   Social Determinants of Health   Financial Resource Strain: Not on file  Food Insecurity: No Food Insecurity (03/06/2020)   Hunger Vital Sign    Worried About Running Out of Food in the Last Year: Never true    Ran Out of Food in the Last Year: Never true  Transportation Needs: No Transportation Needs (03/06/2020)   PRAPARE - Hydrologist (Medical): No    Lack of Transportation (Non-Medical): No  Physical Activity: Not on file  Stress: Not on file  Social Connections: Not on file  Intimate Partner Violence: Not on file    Outpatient Medications Prior to Visit  Medication Sig Dispense Refill   acetaminophen (TYLENOL) 500 MG tablet Take 500 mg by mouth in the morning and at bedtime.     aspirin 81 MG EC tablet Take 81 mg by mouth daily.  (Patient not taking: Reported on 12/01/2021)     carvedilol (COREG) 12.5 MG tablet TAKE 1 TABLET(12.5 MG) BY MOUTH TWICE DAILY WITH A MEAL 180 tablet 3   furosemide (LASIX) 20 MG tablet Take 1 tablet (20 mg total) by mouth daily as needed. 30 tablet 2   levothyroxine (SYNTHROID) 88  MCG tablet TAKE 1 TABLET(88 MCG) BY MOUTH DAILY 90 tablet 0   losartan (COZAAR) 50 MG tablet TAKE 1 TABLET BY MOUTH ONCE DAILY. 90 tablet 1   Multiple Vitamin (MULTIVITAMIN) tablet Take 1 tablet by mouth daily.     Multiple Vitamins-Minerals (EYE VITAMINS PO) Take 1 tablet by mouth daily.     Omega-3 1000 MG CAPS Take 1 capsule by mouth daily.      rosuvastatin (CRESTOR) 20 MG tablet TAKE 1 TABLET(20 MG) BY MOUTH DAILY 90 tablet 0   Facility-Administered Medications Prior to Visit  Medication Dose Route Frequency Provider Last Rate Last Admin   triamcinolone acetonide (KENALOG-40) injection 80 mg  80 mg Intra-articular Once [name], Kirsten, MD        Allergies  Allergen Reactions   Atorvastatin     Review of  Systems See pertinent positives and negatives per HPI.     Objective:    Physical Exam Vitals reviewed.  Constitutional:      Appearance: Normal appearance.  HENT:     Right Ear: Tympanic membrane normal.     Left Ear: Tympanic membrane normal.     Nose: Congestion and rhinorrhea present.     Mouth/Throat:     Pharynx: Posterior oropharyngeal erythema present.  Cardiovascular:     Rate and Rhythm: Normal rate and regular rhythm.     Pulses: Normal pulses.     Heart sounds: Normal heart sounds.  Neurological:     Mental Status: She is alert.     BP 130/80   Pulse 76   Temp (!) 97.5 F (36.4 C)   Ht _0  (1.575 m)   Wt 171 lb (77.6 kg)   SpO2 99%   BMI 31.28 kg/m   Wt Readings from Last 3 Encounters:  09/15/22 171 lb (77.6 kg)  12/01/21 164 lb (74.4 kg)  11/17/21 165 lb (74.8 kg)    Health Maintenance Due  Topic Date Due   Hepatitis C Screening  Never done   DTaP/Tdap/Td (1 - Tdap) Never done   Zoster Vaccines- Shingrix (1 of 2) Never done   COVID-19 Vaccine (3 - Moderna risk series) 03/10/2021   Medicare Annual Wellness (AWV)  10/28/2021   INFLUENZA VACCINE  05/17/2022       Lab Results  Component Value Date   TSH 2.310 12/01/2021   Lab Results  Component Value Date   WBC 4.9 12/01/2021   HGB 12.7 12/01/2021   HCT 37.6 12/01/2021   MCV 91 12/01/2021   PLT 330 12/01/2021   Lab Results  Component Value Date   NA 139 12/01/2021   K 4.5 12/01/2021   CO2 23 12/01/2021   GLUCOSE 104 (H) 12/01/2021   BUN 11 12/01/2021   CREATININE 0.71 12/01/2021   BILITOT 0.4 12/01/2021   ALKPHOS 81 12/01/2021   AST 18 12/01/2021   ALT 13 12/01/2021   PROT 6.1 12/01/2021   ALBUMIN 3.9 12/01/2021   CALCIUM 9.6 12/01/2021   EGFR 89 12/01/2021   Lab Results  Component Value Date   CHOL 146 12/01/2021   Lab Results  Component Value Date   HDL 51 12/01/2021   Lab Results  Component Value Date   LDLCALC 78 12/01/2021   Lab Results  Component Value Date    TRIG 90 12/01/2021   Lab Results  Component Value Date   CHOLHDL 2.9 12/01/2021        Assessment & Plan:   1. Acute non-recurrent sinusitis of other sinus -  azithromycin (ZITHROMAX) 250 MG tablet; Take 2 tablets on day 1, then 1 tablet daily on days 2 through 5  Dispense: 6 tablet; Refill: 0  2. Acute cough - POCT Influenza A/B-NEGATIVE - POCT respiratory syncytial virus-NEGATIVE - POC COVID-19 BinaxNow-NEGATIVE - benzonatate (TESSALON) 100 MG capsule; Take 1 capsule (100 mg total) by mouth 2 (two) times daily as needed for cough.  Dispense: 20 capsule; Refill: 0  -take Mucinex as directed -rest and push fluids   Rest and push fluids Take Z-pack as prescribed Use Tessalon perles as needed for cough Take Mucinex every 12 hours Follow-up as needed    Follow-up: PRN  An After Visit Summary was printed and given to the patient.  I, Rip Harbour, NP, have reviewed all documentation for this visit. The documentation on 09/15/22 for the exam, diagnosis, procedures, and orders are all accurate and complete.   Signed, Rip Harbour, NP [place] [phone]

## 2022-10-04 ENCOUNTER — Telehealth: Payer: Self-pay | Admitting: Family Medicine

## 2022-10-04 NOTE — Telephone Encounter (Signed)
Left message for patient to call back and schedule Medicare Annual Wellness Visit (AWV) either virtually or phone w/hannah kim 40 min appt . Left  my Herbie Drape number [phone]   Last AWV 10/28/20 please schedule with Nurse Health Adviser   45 min for awv-i and in office appointments 30 min for awv-s  phone/virtual appointments

## 2022-10-18 ENCOUNTER — Other Ambulatory Visit: Payer: Self-pay | Admitting: Family Medicine

## 2022-11-01 ENCOUNTER — Other Ambulatory Visit: Payer: Self-pay

## 2022-11-01 MED ORDER — LEVOTHYROXINE SODIUM 88 MCG PO TABS: 88.0000 ug | ORAL_TABLET | Freq: Every day | ORAL | 0 refills | Status: AC

## 2022-11-20 DIAGNOSIS — Z4502 Encounter for adjustment and management of automatic implantable cardiac defibrillator: Secondary | ICD-10-CM | POA: Diagnosis not present

## 2022-11-20 DIAGNOSIS — I4892 Unspecified atrial flutter: Secondary | ICD-10-CM | POA: Diagnosis not present

## 2022-11-20 DIAGNOSIS — I4719 Other supraventricular tachycardia: Secondary | ICD-10-CM | POA: Diagnosis not present

## 2022-11-20 DIAGNOSIS — I4891 Unspecified atrial fibrillation: Secondary | ICD-10-CM | POA: Diagnosis not present

## 2022-11-27 DIAGNOSIS — I499 Cardiac arrhythmia, unspecified: Secondary | ICD-10-CM | POA: Diagnosis not present

## 2022-11-27 DIAGNOSIS — R404 Transient alteration of awareness: Secondary | ICD-10-CM | POA: Diagnosis not present

## 2022-11-27 DIAGNOSIS — R0602 Shortness of breath: Secondary | ICD-10-CM | POA: Diagnosis not present

## 2022-11-27 DIAGNOSIS — R55 Syncope and collapse: Secondary | ICD-10-CM | POA: Diagnosis not present

## 2022-11-27 DIAGNOSIS — R092 Respiratory arrest: Secondary | ICD-10-CM | POA: Diagnosis not present

## 2022-11-27 DIAGNOSIS — Z79899 Other long term (current) drug therapy: Secondary | ICD-10-CM | POA: Diagnosis not present

## 2022-11-27 DIAGNOSIS — J810 Acute pulmonary edema: Secondary | ICD-10-CM | POA: Diagnosis not present

## 2022-11-27 DIAGNOSIS — Z7982 Long term (current) use of aspirin: Secondary | ICD-10-CM | POA: Diagnosis not present

## 2022-11-27 DIAGNOSIS — I639 Cerebral infarction, unspecified: Secondary | ICD-10-CM | POA: Diagnosis not present

## 2022-11-27 DIAGNOSIS — I469 Cardiac arrest, cause unspecified: Secondary | ICD-10-CM | POA: Diagnosis not present

## 2022-12-06 LAB — UNMAPPED LAB RESULTS
Basophil # (HT): 0.1 10 3/uL (ref 0.0–0.2)
Basophil % (HT): 2 % (ref 0–2)
Eosinophil # (HT): 0.2 10 3/uL (ref 0.0–0.5)
Eosinophil % (HT): 4 % (ref 0–7)
Hematocrit (HT): 48 % — ABNORMAL HIGH (ref 34–47)
Hemoglobin (HGB) (HT): 15.9 g/dL (ref 11.5–16.0)
Lymphocyte # (HT): 1.8 10 3/uL (ref 0.9–3.8)
Lymphocyte % (HT): 36 % (ref 17–44)
MCHC (HT): 33 g/dL (ref 32.0–36.0)
MCV (HT): 85.3 fL (ref 81.0–99.0)
Mean Corpuscular Hemoglobin (MCH) (HT): 28.1 pg (ref 26.0–34.0)
Monocyte # (HT): 0.5 10 3/uL (ref 0.2–1.0)
Monocyte % (HT): 9 % (ref 4–12)
Neutrophil # (HT): 2.5 10 3/uL (ref 1.5–7.7)
Platelets (HT): 258 10 3/uL (ref 150–450)
RBC (HT): 5.65 10 6/uL — ABNORMAL HIGH (ref 3.80–5.20)
RDW (HT): 15.7 % — ABNORMAL HIGH (ref 11.5–15.0)
Seg Neut % (HT): 49 % (ref 40–75)
WBC (HT): 5 10 3/uL (ref 4.0–10.8)

## 2022-12-16 DEATH — deceased

## 2022-12-30 ENCOUNTER — Ambulatory Visit
Admission: RE | Admit: 2022-12-30 | Discharge: 2022-12-30 | Disposition: A | Discharge status: Home / Self Care | Payer: Medicare (Managed Care) | Source: Ambulatory Visit

## 2022-12-30 ENCOUNTER — Ambulatory Visit: Payer: Medicare (Managed Care) | Attending: Orthopedic Surgery | Admitting: Orthopedic Surgery

## 2022-12-30 VITALS — BP 168/58 | HR 91 | Temp 97.0°F | Wt 142.0 lb

## 2022-12-30 DIAGNOSIS — M19042 Primary osteoarthritis, left hand: Secondary | ICD-10-CM | POA: Insufficient documentation

## 2022-12-30 DIAGNOSIS — M79642 Pain in left hand: Secondary | ICD-10-CM

## 2022-12-30 DIAGNOSIS — R200 Anesthesia of skin: Secondary | ICD-10-CM

## 2022-12-30 DIAGNOSIS — R202 Paresthesia of skin: Secondary | ICD-10-CM

## 2022-12-30 NOTE — Progress Notes (Signed)
Patient: Kylie Parker  DOB: [birth year]    HISTORY:  Patient is a 77 y.o. right-hand dominant female who presents for evaluation of left hand pain. She reports previous history of left open carpal tunnel and Guyon canal release in 2021 with Dr. Romeo Apple. She reports numbness and tingling constantly in the little finger and starting in the ring finger. She reports improvement of her elbow pain after previous surgery but no other improvements in the hand. She denies any other concerns for today.     Past medical, surgical, social, and family histories were updated and reviewed.   Medications and drug allergies also updated and reviewed.    ROS:  Fourteen system review is updated and reviewed. There are no significant changes.    PHYSICAL EXAM:  BP 168/58   Pulse 91   Temp 36.1 ?C (97 ?F)   SpO2 93%     On examination, the patient is well-developed well-nourished in no apparent distress. Alert and oriented. Normal gait. Answers questions appropriately.    SPECIFIC LEFT UPPER EXTREMITY EXAM  There are no abrasions, rashes, previous scars, or gross lesions appreciated on the upper extremity. There is no skin discoloration or masses noted.  Nontender over carpal, cubital and basal joint. Nontender with grind test of BJA.    There is not evidence of trigger finger.  Wrist range of motion is normal without pain.  Negative Tinel's sign. Negative Phalens sign. Negative Durkan sign. Negative Guyon canal. Positive Tinel's sign for cubital tunnel.   Neurologically intact. There is full strength of the upper extremity. AIN, PIN, Radial, Ulnar, and Median nerves are intact at the hand. SILT is normal to ulnar, median, and radial nerve.  Less than 2 second capillary refill. Radial pulse 2+.    DIAGNOSTIC TESTS:   X-Rays of the Left Hand including AP, Lateral, and Oblique views were taken by outside source.   There is no acute osseous or articular abnormality. Mild to moderate degenerative narrowing is noted at the basal, second  MCP, IP and DIP joints. The soft tissues are grossly unremarkable.     ASSESSMENT:  1. Left hand pain  - Hand LEFT standard PA, Lateral, and Oblique views; Future    2. Numbness and tingling of left upper extremity  - EMG; Future       PLAN:  Patient is presenting with numbness and tingling sensation of the ulnar 2 digits of her left hand.  She has no numbness or tingling sensation on the medial aspect of the forearm.  She had previous open carpal tunnel and Guyon canal release done approximately 3 years ago by another Careers adviser.  She states that after surgery she did not notice any improvement in her numbness and tingling sensation in the ulnar 2 digits.  We agreed to proceed with electrodiagnostic studies.  Referral was ordered.  I encouraged her to use nighttime elbow splint at bedtime.  I recommended stretching exercises for the wrist and the elbow.    Follow up after electrodiagnostic studies. Advised patient to call our office with any questions or concerns in the interim.      Lonna Cobb, MD  Assistant Professor of Clinical Orthopaedics  Division of Hand, Wrist, & Upper Extremity  [facility]   [place] Office: [phone]  [place] Office: [phone]      Thank you for this consultation. Please do not hesitate to contact with any questions regarding today's evaluation.   This note was dictated using speech recognition software.  A thorough attempt was made to proof read and correct any errors.

## 2023-01-31 ENCOUNTER — Other Ambulatory Visit: Payer: Self-pay | Admitting: Family Medicine

## 2023-01-31 DIAGNOSIS — I5022 Chronic systolic (congestive) heart failure: Secondary | ICD-10-CM

## 2023-01-31 DIAGNOSIS — I110 Hypertensive heart disease with heart failure: Secondary | ICD-10-CM

## 2023-02-09 ENCOUNTER — Ambulatory Visit
Admission: RE | Admit: 2023-02-09 | Discharge: 2023-02-09 | Disposition: A | Discharge status: Home / Self Care | Payer: Medicare (Managed Care) | Source: Ambulatory Visit

## 2023-02-09 ENCOUNTER — Other Ambulatory Visit: Payer: Self-pay | Admitting: Neurology

## 2023-02-09 ENCOUNTER — Other Ambulatory Visit: Payer: Self-pay

## 2023-02-09 ENCOUNTER — Ambulatory Visit: Payer: Medicare (Managed Care) | Attending: Neurology

## 2023-02-09 DIAGNOSIS — R200 Anesthesia of skin: Secondary | ICD-10-CM

## 2023-02-09 DIAGNOSIS — M79602 Pain in left arm: Secondary | ICD-10-CM

## 2023-02-09 DIAGNOSIS — G5622 Lesion of ulnar nerve, left upper limb: Secondary | ICD-10-CM

## 2023-02-09 DIAGNOSIS — M25522 Pain in left elbow: Secondary | ICD-10-CM

## 2023-02-09 DIAGNOSIS — R202 Paresthesia of skin: Secondary | ICD-10-CM | POA: Insufficient documentation

## 2023-02-10 ENCOUNTER — Telehealth: Payer: Self-pay | Admitting: Orthopedic Surgery

## 2023-02-10 NOTE — Telephone Encounter (Signed)
Called patient regarding scheduling an appointment on:    CALL OUTCOME: Unable to reach    APPT DATE: to be on 02/24/23    APPT TIME: ??    PROVIDER: [place]    APPOINTMENT TYPE: Ultrasound follow up of left hand

## 2023-02-10 NOTE — Procedures (Signed)
Exam location: [address] Attending physician: Angelica Chessman, M.D.     Patient: Kylie Parker, Kylie Parker  Patient ID: [id number]      Date of Birth: [birth year] Height: 4'9" Gender: Female   Report ID: [id number] Exam Date: 02/09/2023     Referring Physician(s):  Dr. Lonna Cobb / Lorie Phenix, PA / Merlyn Albert [place], [place]   Provider(s):  Angelica Chessman, M.D. / Marcelline Deist     Referring Diagnosis:  Ulnar Neuropathy, at the Elbow, left upper limb / Pain in left elbow   Final Diagnosis:  Ulnar Neuropathy, at the Elbow, left upper limb / Pain in left elbow     Patient History   Kylie Parker is a 77 year old female who was referred for evaluation of left hand numbness.  In 2021 she underwent left carpal tunnel and Guyon canal release surgery for management of sensory symptoms in her hands.  The symptoms initially improved after the procedures.  However, several months later she started noticing numbness in predominantly the fourth and fifth digits of the left hand.  These symptoms have persisted since.  She has also noticed a sense of left grip weakness.  She denies neck pain.      A time out procedure was completed to verify the patient's identity including first and last name, date of birth and confirmed with id band. The correct procedure and site were reviewed with the patient.  Verbal consent was obtained prior to testing.  During performance of nerve conduction studies, distal limb temperature was maintained between 32 to 36 degrees Centigrade.     Clinical Examination   Motor: :MUSCLES:   Sensory: Decreased light touch sensation over the fifth digit of the left hand.  Otherwise light touch sensation is intact and symmetric throughout upper extremities.      Reflexes:  R  L    Biceps  3  2    Triceps  3  2    Brachioradialis  3  2            Muscle:  R  L    Upper extremity    Shoulder abduction  5  5    Shoulder forward flexion  5  5    Elbow flexion  5  5    Elbow extension  5  5    Wrist flexion  5  5     Wrist extension  5  5    Finger extension  5  5    Finger spread  5  4+    Thumb flexion (FPL)  5  5    Thumb abduction  5  4+        Motor Nerve Conduction   Nerve and Stimulus Site  Onset Latency  Amplitude  Conduction Velocity  Distance  Normal    Median.L/APB    Wrist  3.2 ms  7 mV       70 mm  yes    Elbow  6.5 ms  6.3 mV  53 m/s  175 mm  yes    Ulnar.L/ADM    Wrist  2.4 ms  6.3 mV       70 mm  yes    Bel elbow  4.9 ms  5.3 mV  66 m/s  165 mm  yes    Ab elbow  7.4 ms  5.3 mV  40 m/s  100 mm  no        Sensory Nerve Conduction   Nerve and Stimulus  Site  Segment  Onset Latency  Amplitude  Conduction Velocity  Distance  Normal    Median (Dig II).L    Mid palm  Mid palm-Dig II  0.55 ms  47 uV  55 m/s  30 mm  yes    Wrist  Wrist-Dig II  2.4 ms  38 uV  54 m/s  130 mm  yes         Wrist-Mid palm            54 m/s  100 mm  yes    Ulnar (Dig V).L    Wrist  Wrist-Dig V  1.98 ms  9 uV  56 m/s  110 mm  no        Needle EMG Data   Muscle S i d e Spontaneous Activity Motor Unit Morphology Interference Pattern     Insertional Activity Fibs/Pos. Waves Fascics Duration Amplitude Phases Activation Recruit ment   Deltoid  L  Normal  0  0  Normal  Normal  Normal  Normal  Normal    Biceps Brachii  L  Normal  0  0  Normal  Normal  Normal  Normal  Normal    Triceps  L  Normal  0  0  Normal  Normal  Normal  Normal  Normal    Flexor Carpi Radialis  L  Normal  0  0  Normal  Normal  Normal  Normal  Normal    Extensor Indicis Proprius  L  Normal  0  0  Normal  Normal  Normal  Normal  Normal        Neuromuscluar Ultrasound        Ultrasound Study of Peripheral Nerves            Nerve  Site  Side Cross-section Area  Normal    Ulnar  cubital tunnel  Left  7.2 mm2  yes    Ulnar  ulnar groove  Left  14.8 mm2  no    Ulnar  mid arm  Left  5.2 mm2  yes                  Interpretation & Conclusions   This is an abnormal study.      There is electrodiagnostic and ultrasonographic evidence of a focal left ulnar mononeuropathy localizing to the  ulnar groove.        There is no evidence of a left-sided median neuropathy or cervical radiculopathy.      As the teaching physician identified below, I was physically present for the key portions of the electrodiagnostic examination and I prepared the interpretation at the time of the examination.        Signature   This report signed electronically by    Angelica Chessman, M.D. on 02/09/2023 2:21 PM       Angelica Chessman, M.D. / [facility]

## 2023-02-13 NOTE — Telephone Encounter (Signed)
Spoke with patient. Unable to do 5/10.    Future Encounters      03/03/2023 10:30 AM Sch    Arrive by: 10:20 AM    FOLLOW UP VISIT    Dorthula Nettles, MD

## 2023-02-17 ENCOUNTER — Telehealth: Payer: Self-pay | Admitting: Orthopedic Surgery

## 2023-02-17 NOTE — Telephone Encounter (Signed)
Type of form received/requested:  Primary Care Provider Authorization Request    Paperwork/form(s) received via:  Fax    Once paperwork/form is completed, does it need to be faxed?  Yes   If yes, what is the fax #:  [phone]    Once paperwork/form is completed, does patient want to be called to come pick up a copy?  No   If yes, what is best # to reach the patient:  No

## 2023-03-01 NOTE — Progress Notes (Signed)
Patient: Kylie Parker  DOB: [birth year]    HISTORY:  Patient is a 77 y.o. right-hand dominant female who presents for follow-up of left hand pain.  Patient has history of carpal tunnel and Guyon canal release 3 years ago by another Careers adviser.  Patient is following up after undergoing EMG. She reports her numbness and tingling is primarily in the left little and ring fingers and sometimes radiates down the forearm from her elbow. She denies any other concerns for today.     Past medical, surgical, social, and family histories were updated and reviewed.   Medications and drug allergies also updated and reviewed.    ROS:  Fourteen system review is updated and reviewed. There are no significant changes.    PHYSICAL EXAM:  Pulse 78   Temp 35.6 C (96 F)   SpO2 92%     On examination, the patient is well-developed well-nourished in no apparent distress. Alert and oriented. Normal gait. Answers questions appropriately.    SPECIFIC LEFT UPPER EXTREMITY EXAM  There are no abrasions, rashes, previous scars, or gross lesions appreciated on the upper extremity. There is no skin discoloration or masses noted.  Nontender over carpal, cubital and basal joint. Nontender with grind test of BJA.    There is not evidence of trigger finger.  Wrist range of motion is normal without pain.  Negative Tinel's sign. Negative Phalens sign. Negative Durkan sign. Negative Guyon canal. Positive Tinel's sign for cubital tunnel.   Neurologically intact. There is full strength of the upper extremity. AIN, PIN, Radial, Ulnar, and Median nerves are intact at the hand. SILT is normal to ulnar, median, and radial nerve.  Less than 2 second capillary refill. Radial pulse 2+    DIAGNOSTIC TESTS:   EMG 02/09/23: This is an abnormal study.      There is electrodiagnostic and ultrasonographic evidence of a focal left ulnar mononeuropathy localizing to the ulnar groove.        There is no evidence of a left-sided median neuropathy or cervical radiculopathy.      ASSESSMENT:  1. Numbness and tingling of left upper extremity    2. Cubital tunnel syndrome on left       PLAN:  Patient is presenting with numbness and tingling sensation of the ulnar 2 digits of her left hand. She has no numbness or tingling sensation on the medial aspect of the forearm. She had previous open carpal tunnel and Guyon canal release done approximately 3 years ago by another Careers adviser. She states that after surgery she did not notice any improvement in her numbness and tingling sensation in the ulnar 2 digits.  She had electrodiagnostic studies done which showed intermittent left ulnar neuropathy localized to the cubital tunnel.  Clinically patient has symptoms of left cubital tunnel.  I reviewed the risk, benefits, and alternatives of treatment.  Patient is interested in surgical intervention after several years of symptoms and failure of conservative management.  I recommended open cubital tunnel release possible ulnar nerve transposition.  I reviewed the risk associated with surgery.  These include but not limited to infection, tendon injury, neurovascular injury, incomplete resolution of symptoms, need for ulnar nerve position intraoperatively or postop, residual elbow numbness, residual elbow pain, and need for further surgery.  Postoperative course was discussed with the patient.  Questions were elicited and answered to complete satisfaction.  Patient then decided to proceed with surgery and signed the consent for the above-mentioned procedure.    Left cubital tunnel release  with possible ulnar nerve transposition. LMA July   Needs EKG.       Lonna Cobb, MD  Assistant Professor of Clinical Orthopaedics  Division of Hand, Wrist, & Upper Extremity  [facility]   [place] Office: [phone]  [place] Office: [phone]      Thank you for this consultation. Please do not hesitate to contact with any questions regarding today's evaluation.   This note was dictated  using speech recognition software. A thorough attempt was made to proof read and correct any errors.

## 2023-03-03 ENCOUNTER — Ambulatory Visit: Payer: Medicare (Managed Care) | Attending: Orthopedic Surgery | Admitting: Orthopedic Surgery

## 2023-03-03 VITALS — HR 78 | Temp 96.0°F | Wt 142.0 lb

## 2023-03-03 DIAGNOSIS — G5622 Lesion of ulnar nerve, left upper limb: Secondary | ICD-10-CM | POA: Insufficient documentation

## 2023-03-03 DIAGNOSIS — R200 Anesthesia of skin: Secondary | ICD-10-CM | POA: Insufficient documentation

## 2023-03-03 DIAGNOSIS — I10 Essential (primary) hypertension: Secondary | ICD-10-CM | POA: Insufficient documentation

## 2023-03-03 DIAGNOSIS — R202 Paresthesia of skin: Secondary | ICD-10-CM | POA: Insufficient documentation

## 2023-03-03 NOTE — Invasive Procedure Plan of Care (Signed)
CONSENT FOR MEDICAL  OR SURGICAL PROCEDURE                            Patient Name: Kylie Parker  [facility] [address]                                                              DOB: 02/27/1946         Please read this form or have someone read it to you.   It's important to understand all parts of this form. If something isn't clear, ask [place] to explain.   When you sign it, that means you understand the form and give [place] permission to do this surgery or procedure.     I agree for Lonna Cobb, MD , and [facility] Ardeen Jourdain, PA   along with any assistants* they may choose, to treat the following condition(s): Left Cubital Tunnel Syndrome    By doing this surgery or procedure on me: Left Cubital Tunnel Release with possible ulnar nerve transposition    This is also known as: Left Cubital Tunnel Release with possible ulnar nerve transposition    Laterality: Left     *if you'd like a list of the assistants, please ask. We can give that to you.    1. The care provider has explained my condition to me. They have told me how the procedure can help me. They have told me about other ways of treating my condition. I understand the care provider cannot guarantee the result of the procedure. If I don't have this procedure, my other choices are:     2. The care provider has told me the risks (problems that can happen) of the procedure. I understand there may be unwanted results. The risks that are related to this procedure include: Infection, bleeding, nerve, artery, tendon damage, stiffness of wrist, arthritis, incomplete resolution of symptoms, need for additional surgery       3. I understand that during the procedure, my care provider may find a condition that we didn't know about before the treatment started. Therefore, I agree that my care provider can perform any other treatment which they think is necessary and available.    4. I give permission to the hospital and/or its departments to examine and keep tissue, blood,  body parts, fluids or materials removed from my body during the procedure(s) to aid in diagnosis and treatment, after which they may be used for scientific research or teaching by appropriate persons. If these materials are used for science or teaching, my identity will be protected. I will no longer own or have any rights to these materials regardless of how they may be used.    5. My care provider might want a representative from a medical device company to be there during my procedure. I understand that person works for:          The ways they might help my care provider during my procedure include:            6. Here are my decisions about receiving blood, blood products, or tissues. I understand my decisions cover the time before, during and after my procedure, my treatment, and my time in the hospital. After my  procedure, if my condition changes a lot, my care provider will talk with me again about receiving blood or blood products. At that time, my care provider might need me to review and sign another consent form, about getting or refusing blood.    I understand that the blood is from the community blood supply. Volunteers donated the blood, the volunteers were screened for health problems. The blood was examined with very sensitive and accurate tests to look for hepatitis, HIV/AIDS, and other diseases. Before I receive blood, it is tested again to make sure it is the correct type.    My chances of getting a sickness from blood products are small. But no transfusion is 100% safe. I understand that my care provider feels the good I will receive from the blood is greater than the chances of something going wrong. My care provider has answered my questions about blood products.      My decision  about blood or  blood products           My decision   about tissue  Implants              I understand this  form.    My care provider  or his/her  assistants have  explained:   What I am having done and why I need  it.  What other choices I can make instead of having this done.  The benefits and possible risks (problems) to me of having this done.  The benefits and possible risks (problems) to me of receiving transplants, blood, or blood products.  There is no guarantee of the results.  The care provider may not stay with me the entire time that I am in the operating or procedure room.  My provider has explained how this may affect my procedure. My provider has answered my  questions about this.         I give my  permission for  this surgery or  procedure.            _______________________________________________                                     My signature  (or parent or other person authorized to sign for you, if you are unable to sign for  yourself or if you are under 42 years old)        ______           Date        _____        Time   Electronic Signatures will display at the bottom of the consent form.    Care provider's statement: I have discussed the planned procedure, including the possibility for transfusion of blood  products or receipt of tissue as necessary; expected benefits; the possible complications and risks; and possible alternatives  and their benefits and risks with the patients or the patient's surrogate. In my opinion, the patient or the patient's surrogate  understands the proposed procedure, its risks, benefits and alternatives.              Electronically signed by: Paulino Rily, PA  03/03/2023         Date        11:56 AM        Time

## 2023-03-03 NOTE — Progress Notes (Signed)
Dr. Irish Lack Surgical Booking Sheet    Left Cubital Tunnel Release with possible ulnar nerve transposition     Date of Surgery:  July 17th     []  This week  []  Next Week []  Any date    Location:    []   [facility]    [x]   [facility]    Insurance:     []    WC  []   MVA   []   VA      Anesthesia:   []  Local  [x]  General (LMA)  []  MAC   []  Regional    Position:        []  Beach chair          [x]  Supine          []  Prone     Admit Type:   [x]  ASC              []  Inpatient/SDA            []  23hr      Length of Procedure:   []  15 min  []  30 min   [x]  45 min []  60 min  []  75 min  []  90 min  []  120 min  []  other:     Laterality:  []  Right      [x]  Left     Stat Imaging:  []     Medical Clearance:  [x]  She will need EKG.     Consults Needed?               []  PCP (med clearance) []  Anesthesia   []  Hematology  []  Pulmonology                []  Cardiology     []  Neurology        []  Pain                  []  Other:    Labs/Tests:                []  CBC, SMA 12, PT/PTT, ESR, CRP, UA w/C+S, Type & Screen               []  Vitamin D - Check before confirming Sx date               []  Type & Screen, CBC, PT/PTT, CMP, Serum Preg (Fem), UA, MRSA Swab                []  CBC, BMP, PT/PTT               [x]  EKG               []  Chest XR               []  Other:         Post-op:    []  3 -5 days   []   7-10 days   [x]  10-14 days     Therapy Preferences :  []  2-3 days  []  5-7 days  []  2 weeks - after my visit  []  Hand Therapy           []  Occupational Therapy         []  Physical therapy                   [] Open carpal tunnel release - 64721  [] Endoscopic - 16109  ICD-10: G56.00 [x] Ulnar nerve release at the elbow - 64718       []   Ulnar nerve release at the wrist - 64719   ICD-10: G56.20 [] ___    ___ trigger finger release - 26055  ICD-10: M65.30   [] Nail plate removal - 45409  [] Nail bed repair - 11760  ICD-10: S61.309A [] Distal radius ORIF - 25607 []  25608   [] 25609  [] 25606 (CRPP)  ICD-10: S52.509A [] _______ mallet finger 26432 (pin)  [] 81191 (tendon repair)  [] 47829  (bony)  ICD-10: M20.012   [] _______ P1/P2 CRPP - 56213  [] _______ P1/P2 ORIF - 08657   ICD-10: S62.609A [] _______ metacarpal CRPP - 84696  [] ______ metacarpal ORIF - 29528  ICD-10: S62.309A [] Scaphoid ORIF - 41324  [] 25440 (non-union)  ICD-10: S62.009A   Dupuytren's  [] palm 40102  [] w/1 PIP 26123  []  +1 26125  [] needle 26040  ICD-10: M72.0 Flexor tendon repair  [] zone II 26356  [] not II 72536  ICD-10: S66.809A Extensor tendon repair  [] finger 26418  [] hand 26410  [] wrist 25275  ICD-10: S66.329A   Basal joint arthroplasty   [] 25447  [] 64403 tendon transfer  ICD-10: M18.10 Thumb UCL/RCL  [] repair 26540  [] reconstruction 47425  ICD-10: S63.649A [] de Quervain's release - 25000  ICD-10: M65.4   [] Mucous cyst - cyst 26160  [] Tissue transfer 14040  [] P2 26235  [] P3 26236  ICD-10: M71.349 [] Cysts - finger cyst 26160[] wrist ganglion 25111  [] recurrent 25112  ICD-10: finger M67.449   wrist M67.439 [] Distal biceps repair 24342  ICD-10: S46.229S   Wrist arthroscopy   [] synovectomy partial 95638  [] complete 29845  [] debridement/repair TFCC 75643  ICD-10: M65.849 [] Mass - Hand tumor 26115 (SQ)   [] 26116 (deep)  [] wrist/forearm (SQ) 25075  [] deep 25076  [] arm/elbow (SQ) 24075  [] deep 24076  ICD-10: R22.30 [] Shoulder arthroplasty/reverse - 23472  [] hemi 23470  ICD-10: M19.019   [] Proximal humerus ORIF -32951  ICD-10: S42. 202A [] Olecranon ORIF - 88416  ICD-10: S52.009A Distal humerus ORIF   [] extra 24545  [] intra 24546  [] uni-condyle 24579   ICD-10: S42.409A   Radial head   [] ORIF 60630  [] replacement 24666  ICD-10: S52.109A [] Clavicle ORIF - 16010  ICD-10: S42.009A Forearm ORIF  [] both 25575  [] one 25574  [] ulna 25545  [] radius 93235  ICD-10: S52.90XA   Ankle ORIF  [] Lateral mal 57322  [] Bimal 02542  [] Rosemarie Beath 70623  ICD-10: S82 Other Trauma :          []  K-Wires    []  Mini C-Arm []  Big C-Arm  []  Micro instruments []  Cast cart  []  Synthes    []  Arthrex       []  Skeletal Dynamics  []  Axogen   []  Microaire    Tray needed: _____

## 2023-03-06 ENCOUNTER — Encounter: Payer: Self-pay | Admitting: Gastroenterology

## 2023-04-12 ENCOUNTER — Telehealth: Payer: Self-pay

## 2023-04-12 NOTE — Telephone Encounter (Signed)
Spoke to Kylie Parker and she states that she is cancelling because she has a spot on her arm that they are suspecting is "not good". So she wants to get that taken care of first and then her friend that she was going to stay with after the surgery has had family issues come up that she does not want to be a burden on her so at this time she feels its just not the time to do the surgery.   She states if things turn out ok with the spot and she is able to get some assistance after surgery down the road she will call to make appointment.

## 2023-04-14 ENCOUNTER — Ambulatory Visit: Payer: Medicare (Managed Care)

## 2023-04-18 ENCOUNTER — Encounter: Payer: Self-pay | Admitting: Surgery

## 2023-04-18 ENCOUNTER — Ambulatory Visit: Payer: Medicare (Managed Care) | Attending: Surgery | Admitting: Surgery

## 2023-04-18 ENCOUNTER — Other Ambulatory Visit: Payer: Self-pay

## 2023-04-18 ENCOUNTER — Ambulatory Visit: Payer: Medicare (Managed Care) | Admitting: Surgery

## 2023-04-18 VITALS — BP 171/110 | HR 89 | Temp 98.0°F | Resp 18 | Ht 59.0 in | Wt 139.0 lb

## 2023-04-18 DIAGNOSIS — L859 Epidermal thickening, unspecified: Secondary | ICD-10-CM

## 2023-04-18 DIAGNOSIS — L989 Disorder of the skin and subcutaneous tissue, unspecified: Secondary | ICD-10-CM | POA: Insufficient documentation

## 2023-04-18 NOTE — Invasive Procedure Plan of Care (Signed)
CONSENT FOR MEDICAL  OR SURGICAL PROCEDURE                            Patient Name: Kylie Parker  [facility] [address]                                                              DOB: January 23, 1946         Please read this form or have someone read it to you.   It's important to understand all parts of this form. If something isn't clear, ask [place] to explain.   When you sign it, that means you understand the form and give [place] permission to do this surgery or procedure.     I agree for Mercie Eon, MD along with any assistants* they may choose, to treat the following condition(s): Frustrating bump of right arm   By doing this surgery or procedure on me: Biopsy of lesion of right upper extremity   This is also known as: Shave biopsy of right upper extremity   Laterality: Right     *if you'd like a list of the assistants, please ask. We can give that to you.    1. The care provider has explained my condition to me. They have told me how the procedure can help me. They have told me about other ways of treating my condition. I understand the care provider cannot guarantee the result of the procedure. If I don't have this procedure, my other choices are: Observation, formal excision    2. The care provider has told me the risks (problems that can happen) of the procedure. I understand there may be unwanted results. The risks that are related to this procedure include: Bleeding, infection, damage to surrounding structures, need for further procedures    3. I understand that during the procedure, my care provider may find a condition that we didn't know about before the treatment started. Therefore, I agree that my care provider can perform any other treatment which they think is necessary and available.    4. I give permission to the hospital and/or its departments to examine and keep tissue, blood, body parts, fluids or materials removed from my body during the procedure(s) to aid in diagnosis and treatment, after which they may  be used for scientific research or teaching by appropriate persons. If these materials are used for science or teaching, my identity will be protected. I will no longer own or have any rights to these materials regardless of how they may be used.    5. My care provider might want a representative from a medical device company to be there during my procedure. I understand that person works for:          The ways they might help my care provider during my procedure include:            6. Here are my decisions about receiving blood, blood products, or tissues. I understand my decisions cover the time before, during and after my procedure, my treatment, and my time in the hospital. After my procedure, if my condition changes a lot, my care provider will talk with me again about receiving blood or blood products. At that time,  my care provider might need me to review and sign another consent form, about getting or refusing blood.    I understand that the blood is from the community blood supply. Volunteers donated the blood, the volunteers were screened for health problems. The blood was examined with very sensitive and accurate tests to look for hepatitis, HIV/AIDS, and other diseases. Before I receive blood, it is tested again to make sure it is the correct type.    My chances of getting a sickness from blood products are small. But no transfusion is 100% safe. I understand that my care provider feels the good I will receive from the blood is greater than the chances of something going wrong. My care provider has answered my questions about blood products.      My decision  about blood or  blood products           My decision   about tissue  Implants              I understand this  form.    My care provider  or his/her  assistants have  explained:   What I am having done and why I need it.  What other choices I can make instead of having this done.  The benefits and possible risks (problems) to me of having this  done.  The benefits and possible risks (problems) to me of receiving transplants, blood, or blood products.  There is no guarantee of the results.  The care provider may not stay with me the entire time that I am in the operating or procedure room.  My provider has explained how this may affect my procedure. My provider has answered my  questions about this.         I give my  permission for  this surgery or  procedure.            _______________________________________________                                     My signature  (or parent or other person authorized to sign for you, if you are unable to sign for  yourself or if you are under 32 years old)        ______           Date        _____        Time   Electronic Signatures will display at the bottom of the consent form.    Care provider's statement: I have discussed the planned procedure, including the possibility for transfusion of blood  products or receipt of tissue as necessary; expected benefits; the possible complications and risks; and possible alternatives  and their benefits and risks with the patients or the patient's surrogate. In my opinion, the patient or the patient's surrogate  understands the proposed procedure, its risks, benefits and alternatives.              Electronically signed by: Mercie Eon, MD                                                04/18/2023         Date        11:21 AM  Time

## 2023-04-18 NOTE — Progress Notes (Signed)
This pleasant woman presents with a small lesion of her right upper extremity.  4 mm x 4 mm, perhaps raised by 1 mm.  It flakes off from time to time usually when it catches on some clothing.  She would like it addressed.    Unclear whether this is benign or malignant based on examination.  May have an underlying squamous cell cancer.  As such I recommended a shave biopsy.    I do not think she is particularly well.  She does have COPD.  But certainly no real reason not to pursue a shave biopsy in clinic.  She does not take any blood thinning medications.    She is counseled regarding risks including bleeding and infection and need for further procedures.  Despite these risks she is willing to proceed.  Verified her allergies.    The area was prepped widely with chlorhexidine and alcohol and a raised lesion with 1% lidocaine with 1: 100,000 epinephrine.  I draped.  I shaved the lesion off at its base using a 15 blade scalpel and passed this off the field as a specimen.  Silver nitrate was used to control the bleeding.  I then applied bacitracin and a Band-Aid.    Shower tomorrow  Dressing for 48 hours approximately  After that she can use a bandage as desired.    I will call her with pathology results.    Mercie Eon MD  General Surgery  [place] Office: [phone]  [facility] Office: [phone]  04/18/23 11:38 AM

## 2023-04-28 LAB — SURGICAL PATHOLOGY

## 2023-05-02 ENCOUNTER — Telehealth: Payer: Self-pay

## 2023-05-02 NOTE — Telephone Encounter (Signed)
Patient called in stating that she has a biopsy done on 7/2 with Dr Lorenso Courier and she has not heard back as to the results. She is asking for a call back at [phone].

## 2023-05-03 ENCOUNTER — Telehealth: Payer: Self-pay | Admitting: Surgery

## 2023-05-03 SURGERY — RELEASE, CUBITAL TUNNEL
Anesthesia: General | Site: Elbow | Laterality: Left

## 2023-05-03 NOTE — Telephone Encounter (Signed)
Spoke with patient over phone.  Benign pathology but I think still reasonable to pursue formal excision.  Will set up an appointment.    Mercie Eon MD  General Surgery  [place] Office: [phone]  [facility] Office: [phone]  05/03/23 4:17 PM

## 2023-06-13 ENCOUNTER — Ambulatory Visit: Payer: Medicare (Managed Care) | Admitting: Surgery

## 2023-06-16 ENCOUNTER — Telehealth: Payer: Self-pay

## 2023-06-16 NOTE — Telephone Encounter (Signed)
Patient called office stating she had a missed call but no voicemail.  No documentation in chart showing office reached out.   Informed patient that she did miss an appointment with Dr Lorenso Courier on 8/27 for a skin lesion removal.   Patient stated she was not interested in rescheduling at this time but that if she changes her mind she will call the office.

## 2023-07-14 LAB — UNMAPPED LAB RESULTS
Basophil # (HT): 0.1 10 3/uL (ref 0.0–0.2)
Basophil % (HT): 1 % (ref 0–2)
Eosinophil # (HT): 0.1 10 3/uL (ref 0.0–0.5)
Eosinophil % (HT): 2 % (ref 0–7)
Hematocrit (HT): 48 % — ABNORMAL HIGH (ref 34–47)
Hemoglobin (HGB) (HT): 16.2 g/dL — ABNORMAL HIGH (ref 11.5–16.0)
Lymphocyte # (HT): 1.8 10 3/uL (ref 0.9–3.8)
Lymphocyte % (HT): 33 % (ref 17–44)
MCHC (HT): 33.8 g/dL (ref 32.0–36.0)
MCV (HT): 87.4 fL (ref 81.0–99.0)
Mean Corpuscular Hemoglobin (MCH) (HT): 29.6 pg (ref 26.0–34.0)
Monocyte # (HT): 0.5 10 3/uL (ref 0.2–1.0)
Monocyte % (HT): 8 % (ref 4–12)
Neutrophil # (HT): 3 10 3/uL (ref 1.5–7.7)
Platelets (HT): 222 10 3/uL (ref 150–450)
RBC (HT): 5.48 10 6/uL — ABNORMAL HIGH (ref 3.80–5.20)
RDW (HT): 14.4 % (ref 11.5–15.0)
Seg Neut % (HT): 56 % (ref 40–75)
WBC (HT): 5.4 10 3/uL (ref 4.0–10.8)

## 2023-08-15 ENCOUNTER — Emergency Department
Admission: EM | Admit: 2023-08-15 | Discharge: 2023-08-15 | Disposition: A | Discharge status: Home / Self Care | Payer: Medicare (Managed Care) | Source: Ambulatory Visit | Attending: Emergency Medicine | Admitting: Emergency Medicine

## 2023-08-15 ENCOUNTER — Other Ambulatory Visit: Payer: Self-pay

## 2023-08-15 ENCOUNTER — Emergency Department: Payer: Medicare (Managed Care)

## 2023-08-15 DIAGNOSIS — Y998 Other external cause status: Secondary | ICD-10-CM | POA: Insufficient documentation

## 2023-08-15 DIAGNOSIS — S0990XA Unspecified injury of head, initial encounter: Secondary | ICD-10-CM | POA: Insufficient documentation

## 2023-08-15 DIAGNOSIS — R531 Weakness: Secondary | ICD-10-CM | POA: Insufficient documentation

## 2023-08-15 DIAGNOSIS — Y9389 Activity, other specified: Secondary | ICD-10-CM | POA: Insufficient documentation

## 2023-08-15 DIAGNOSIS — M25561 Pain in right knee: Secondary | ICD-10-CM | POA: Insufficient documentation

## 2023-08-15 DIAGNOSIS — W19XXXA Unspecified fall, initial encounter: Secondary | ICD-10-CM | POA: Insufficient documentation

## 2023-08-15 DIAGNOSIS — M25562 Pain in left knee: Secondary | ICD-10-CM | POA: Insufficient documentation

## 2023-08-15 DIAGNOSIS — Y9289 Other specified places as the place of occurrence of the external cause: Secondary | ICD-10-CM | POA: Insufficient documentation

## 2023-08-15 DIAGNOSIS — S098XXA Other specified injuries of head, initial encounter: Secondary | ICD-10-CM

## 2023-08-15 LAB — COMPREHENSIVE METABOLIC PANEL
ALT: 10 U/L (ref 0–35)
AST: 66 U/L — ABNORMAL HIGH (ref 0–35)
Albumin: 4.2 g/dL (ref 3.5–5.2)
Alk Phos: 83 U/L (ref 35–105)
Anion Gap: 12 (ref 7–16)
Bilirubin,Total: 0.2 mg/dL (ref 0.0–1.2)
CO2: 29 mmol/L — ABNORMAL HIGH (ref 20–28)
Calcium: 9.8 mg/dL (ref 8.6–10.2)
Chloride: 100 mmol/L (ref 96–108)
Creatinine: 0.67 mg/dL (ref 0.51–0.95)
Glucose: 96 mg/dL (ref 60–99)
Lab: 12 mg/dL (ref 6–20)
Potassium: 3.9 mmol/L (ref 3.3–4.6)
Sodium: 141 mmol/L (ref 133–145)
Total Protein: 8 g/dL — ABNORMAL HIGH (ref 6.3–7.7)
eGFR BY CREAT: 90 *

## 2023-08-15 LAB — EKG 12-LEAD
P: 60 deg
PR: 144 ms
QRS: 36 deg
QRSD: 86 ms
QT: 387 ms
QTc: 423 ms
Rate: 72 {beats}/min
T: 38 deg

## 2023-08-15 LAB — URINALYSIS REFLEX TO CULTURE
Blood,UA: NEGATIVE
Glucose,UA: NEGATIVE
Ketones, UA: NEGATIVE
Leuk Esterase,UA: NEGATIVE
Nitrite,UA: NEGATIVE
Protein,UA: NEGATIVE
Specific Gravity,UA: 1.01 (ref 1.002–1.030)
pH,UA: 6.5 (ref 5.0–8.0)

## 2023-08-15 LAB — CBC AND DIFFERENTIAL
Baso # K/uL: 0.1 10*3/uL (ref 0.0–0.2)
Eos # K/uL: 0.1 10*3/uL (ref 0.0–0.5)
Hematocrit: 56 % — ABNORMAL HIGH (ref 34–49)
Hemoglobin: 17.9 g/dL — ABNORMAL HIGH (ref 11.2–16.0)
IMM Granulocytes #: 0 10*3/uL (ref 0.0–0.0)
IMM Granulocytes: 0.4 %
Lymph # K/uL: 1 10*3/uL (ref 1.0–5.0)
MCV: 88 fL (ref 75–100)
Mono # K/uL: 0.3 10*3/uL (ref 0.1–1.0)
Neut # K/uL: 3.6 10*3/uL (ref 1.5–6.5)
Nucl RBC # K/uL: 0 10*3/uL (ref 0.0–0.0)
Nucl RBC %: 0 /100{WBCs} (ref 0.0–0.2)
Platelets: 200 10*3/uL (ref 150–450)
RBC: 6.4 MIL/uL — ABNORMAL HIGH (ref 4.0–5.5)
RDW: 17.2 % — ABNORMAL HIGH (ref 0.0–15.0)
Seg Neut %: 70.7 %
WBC: 5 10*3/uL (ref 3.5–11.0)

## 2023-08-15 LAB — PROTIME-INR
INR: 0.9 (ref 0.9–1.1)
Protime: 10.3 s (ref 10.0–12.9)

## 2023-08-15 LAB — HOLD SST

## 2023-08-15 LAB — NT-PRO BNP: NT-pro BNP: 310 pg/mL (ref 0–1800)

## 2023-08-15 NOTE — ED Provider Notes (Signed)
History     Chief Complaint   Patient presents with    Mishayla Sliwinski is a 77 y.o. female with ADHD, bipolar 1 disorder, fibromyalgia, COPD, arthritis, and episodes of angina in the past presents via [place] [facility] after a fall due to sudden bilateral leg weakness per the triage note.  In her explanation to me it sounds as though she suddenly became diffusely weak.  Denies passing out.  States she felt as though it might have been worse on the right, but was substantially present bilaterally.  She does note that she hit her head, but also denied losing consciousness after that.  Her legs now feel tender on both sides, particularly at the knees.  She does note that she came down on her knees.  She denies any neck pain.  She denies any vision changes.  She demonstrates good strength through all extremities for me.  No facial asymmetry noted at any point.      Medical/Surgical/Family History     Past Medical History:   Diagnosis Date    ADHD (attention deficit hyperactivity disorder)     Angina     Arthritis     Bipolar 1 disorder     Chest pain, unspecified     pressure    COPD (chronic obstructive pulmonary disease)     Fibromyalgia     Hypertension     Shortness of breath         Patient Active Problem List   Diagnosis Code    Numbness in both hands R20.0    Carpal tunnel syndrome on both sides G56.03    Cervical radiculopathy at C6 M54.12            Past Surgical History:   Procedure Laterality Date    COLON SURGERY      abcess    HAND SURGERY Bilateral     trigger finger bilateral    TONSILLECTOMY            Social History     Tobacco Use    Smoking status: Every Day     Packs/day: 1.00     Years: 50.00     Additional pack years: 0.00     Total pack years: 50.00     Types: Cigarettes    Smokeless tobacco: Never   Substance Use Topics    Alcohol use: No    Drug use: No             Review of Systems   Constitutional:  Positive for fatigue. Negative for activity change, appetite change, chills,  diaphoresis and fever.   HENT:  Negative for congestion and rhinorrhea.    Eyes:  Negative for visual disturbance.   Respiratory:  Negative for chest tightness and shortness of breath.    Cardiovascular:  Negative for chest pain, palpitations and leg swelling.   Gastrointestinal:  Negative for abdominal pain, diarrhea, nausea and vomiting.   Genitourinary:  Negative for decreased urine volume and dysuria.   Musculoskeletal:  Positive for arthralgias and myalgias. Negative for back pain, joint swelling, neck pain and neck stiffness.   Skin:  Negative for color change, pallor and wound.   Allergic/Immunologic: Negative for immunocompromised state.   Neurological:  Positive for weakness and light-headedness. Negative for dizziness, facial asymmetry, numbness and headaches.   Psychiatric/Behavioral:  Negative for agitation and confusion.        Physical Exam     Triage Vitals  Triage  Start: Start, (08/15/23 0116)  First Recorded BP: 157/80, Resp: 16, Temp: 36.2 C (97.2 F), Temp src: TEMPORAL Oxygen Therapy SpO2: 92 %, O2 Device: None (Room air),   Heart Rate (via Pulse Ox): 74, (08/15/23 0118).  First Pain Reported  0-10 Scale: 0, (08/15/23 0118)       Physical Exam  Vitals and nursing note reviewed.   Constitutional:       General: She is not in acute distress.     Appearance: Normal appearance. She is not ill-appearing, toxic-appearing or diaphoretic.   HENT:      Head: Normocephalic and atraumatic.   Cardiovascular:      Rate and Rhythm: Normal rate and regular rhythm.   Pulmonary:      Effort: Pulmonary effort is normal.      Breath sounds: Normal breath sounds.   Musculoskeletal:      Cervical back: Normal range of motion and neck supple.   Neurological:      Mental Status: She is alert.         Medical Decision Making       Patient seen by me on arrival date of 08/15/2023 at at time of arrival  1:14 AM.  Initial face to face evaluation time noted above may be discrepant due to patient acuity and delay in  documentation.    Assessment:  77 y.o., female comes to the ED with some bilateral knee pain after a sudden weakness that caused her to fall onto her knees.  Vitals are notable for mild hypertension which is a baseline condition for her and are otherwise well within normal limits.    Differential Diagnosis includes:  - Cardiogenic near syncope  - Dysrhythmia -none appreciated  - Vasovagal near syncope  - Orthostatic near syncope -will check for dehydration or electrolyte derangement  - Stroke -NIH stroke scale is 0 at this time  - Knee fracture -knee examination is quite reassuring without deformity or swelling or bruising or crepitus and intact strength and reflexes throughout  - Side effects of occult infection -will check labs and UA    Plan:   Orders Placed This Encounter    Urinalysis reflex to culture    CT head without contrast    CBC and differential    Comprehensive metabolic panel    Protime-INR    NT-pro BNP    Urinalysis with reflex to Microscopic UA and reflex to Bacterial Culture    Hold SST    Oral Hydration Therapy    EKG 12 lead (initial)    Insert peripheral IV     Addendum: EKG and telemetry are reassuring.  Lab work suggest mild dehydration based on hemoconcentration.  CT head is benign.  Urinalysis is normal.  NT proBNP is normal.  Patient drinking oral hydration well.  Ambulatory without difficulty.  Discharging with return precautions and follow-up instructions.         Thayer Dallas, MD          Author:  Thayer Dallas, MD       Victoriano Lain, MD  08/22/23 1425

## 2023-08-15 NOTE — ED Notes (Signed)
Discharge instructions and follow up care reviewed with patient. Pt verbalized understanding and states no further questions. VSS. Pt D/C ambulatory home.

## 2023-08-15 NOTE — Discharge Instructions (Addendum)
You were seen here today due to feeling weak and falling.  You are moving her extremities well and they are able to walk without difficulty here, which is reassuring.  Your head CT showed no evidence of any injury inside of your head.  Your lab work was only notable for evidence of some mild dehydration.  You are safe to go home.  Make sure that you stay well-hydrated at all times.  The best way to do that is to drink small amounts of water regularly throughout the day.  If you start feeling far worse, please return to the emergency department, otherwise follow-up closely with your primary care physician.

## 2023-08-15 NOTE — ED Notes (Signed)
Patient given gatorade for oral hydration.

## 2023-08-15 NOTE — ED Triage Notes (Signed)
Pt arrived via [facility] ambulance, went to bathroom and legs felt weak and fell. Right greater than left. Pt stated she hit her head, denies LOC. Stated her legs felt numb at first, now just hurt.

## 2023-08-16 NOTE — Telephone Encounter (Signed)
Patient would like to discuss her recent ED visit.      Patient had a fall yesterday and she doesn't feel her concerns were addressed.

## 2023-08-16 NOTE — Telephone Encounter (Signed)
Shantise Beauchesne is scheduled for an appointment on 08/21/2023 at 9:00am.  At 8:00am [facility] will pick up Harrell Lark at [address] [place] and take them to [address], [place]  At 10:30am [facility] will pick up [employer] at [address], [place] and take them to [address], [place]        Damica Venson's Trip  Date: 08/21/2023 Time: 9:00am Inv# [phone]

## 2023-08-16 NOTE — Telephone Encounter (Signed)
Spoke with patient on phone. She is calling [facility] to get home care aide. Admits that she needs help at home due to immobility, memory problems, etc. Patient has worked with [facility] for assist in the past.   Was seen at ED for fall yesterday. Having back and leg pain. Has a walker someone gave her but it is too big.   Scheduled appointment for follow up in this office 11/4 and to discuss possible DME, possible visiting nurse for PT.  Contacted [employer] for medical travel arrangements.

## 2023-08-18 NOTE — Telephone Encounter (Signed)
8am pick up. [facility].       Debara Pickett, and she is aware.

## 2023-08-21 NOTE — Progress Notes (Signed)
Subjective      Name Kylie Parker MRN [id number]    DOB [birth year] AGE/SEX 77 y.o./ female   Preferred Name Kylie Parker she/her/hers      Chief Complaint Chief Complaint   Patient presents with   . Follow-up     Hospital Follow up- fall        Visit History for  08/21/2023   Problem List <redacted file path>* Health Maintenance <redacted file path>* Results/Data <redacted file path>*  Review Flowsheets <redacted file path>*   History of Present Illness  HPI     Kylie Parker is a 77 year old female, with past medical history including coronary artery disease, COPD, hypertension, chronic pain and bipolar disorder, that presents to the office today for an ED visit.    The patient presented to [facility] emergency department on 08/15/2023 after falling at home.  She says that she had just woken up from a nap on her couch and got up to walk towards her room.  She said she then got turned around and fell walking back out to her couch.  She says that she crawled across the room to get to the phone and call her life alert. She did not believe she hit her head. She said she did have right thigh pain when standing up from the couch and continues to have pain there.  She says the increased oxycodone acetaminophen has been helping with her chronic back pain.  While in the emergency department, she did have imaging of her head as well as labs, with no acute findings.  She says that she would like a small cane to use throughout her house for stability.  She does not want a walker.  She has [facility] to [facility] as she is considering getting a home health aide, however she also says she does not necessarily want anybody inside her house.  She is not interested in physical therapy and says that she does exercises on her own and stays active throughout the house.    She does believe she has a vaginal yeast infection- she has been having white cottage cheese discharge for the last couple of weeks.    Past Medical  History:   Diagnosis Date   . Angina pectoris 11/06/2019   . Anorexia    . Anxiety    . Arthritis    . Bipolar 1 disorder    . COPD (chronic obstructive pulmonary disease)    . Fibromyalgia    . Hyperlipidemia    . Hypertension    . Hypomagnesemia    . Nicotine dependence    . Pleurodynia    . Rib pain 11/06/2019   . Sleep disorder    . Vitamin D deficiency      Family History   Problem Relation Name Age of Onset   . Diabetes Mother     . Heart disease Mother     . Angina Mother     . Diabetes Father     . Heart disease Father     . Heart failure Father     . Cancer Sister     . Leukemia Sister       Social History     Tobacco Use   . Smoking status: Every Day     Current packs/day: 1.00     Average packs/day: 1 pack/day for 61.0 years (61.0 ttl pk-yrs)     Types: Cigarettes     Passive exposure: Past   .  Smokeless tobacco: Never   Substance Use Topics   . Alcohol use: Never     Comment: None since 1991   . Drug use: Yes     Types: Oxycodone, Benzodiazepines        Review of Systems   Constitutional:  Negative for chills and fever.   Respiratory:  Negative for cough and shortness of breath.    Cardiovascular:  Negative for chest pain, palpitations and leg swelling.   Gastrointestinal:  Negative for abdominal pain, nausea and vomiting.   Genitourinary:  Negative for dysuria and hematuria.   Musculoskeletal:  Positive for back pain (chronic), joint pain (right knee pain) and myalgias (posterior right thigh).   Skin:  Negative for rash.   Neurological:  Positive for sensory change (left fingers, chronic). Negative for dizziness and headaches.   All other systems reviewed and are negative.       Medications <redacted file path> and  Allergies <redacted file path>     Current Outpatient Medications   Medication Instructions   . albuterol-ipratropium (COMBIVENT RESPIMAT) 20-100 mcg/actuation Inhl inhaler INHALE 1 PUFF BY MOUTH 4 TIMES DAILY   . ALPRAZolam 0.5 MG Oral tablet TAKE 1 TABLET BY MOUTH 4 TIMES DAILY AS NEEDED  FOR ANXIETY . DO NOT EXCEED 4 PER 24 HOURS   . amLODIPine (NORVASC) 2.5 mg, Oral, DAILY   . aspirin 81 MG Oral EC tablet Oral   . aspirin-caffeine (ANACIN) 400-32 mg Oral Tab 2 tablets, Oral, 2 times daily, For breakthrough pain   . ezetimibe (ZETIA) 10 mg, Oral, DAILY, Take 1 tablet by mouth once daily   . fluconazole (DIFLUCAN) 150 mg, Oral, ONCE, If symptoms persist, you may take an additional 1 tablet 72 hours after the first   . isosorbide mononitrate (IMDUR) 30 mg, Oral, DAILY, Take 1 tablet by mouth once daily   . lisinopriL (PRINIVIL,ZESTRIL) 40 mg, Oral, DAILY   . LYRICA 100 mg, Oral, 3 times daily   . melatonin 10 mg, Oral   . metoprolol SUCCINATE (TOPROL-XL) 25 mg, Oral, DAILY, Take 1 tablet by mouth once daily   . nystatin Top cream Apply under breast four time a days prn   . ondansetron 8 MG Oral tablet TAKE 1 TABLET BY MOUTH THREE TIMES DAILY AS NEEDED FOR NAUSEA   . oxyCODONE-acetaminophen 10-325 mg Oral per tablet 1 tablet, Oral, EVERY 8 HOURS PRN   . venlafaxine (EFFEXOR-XR) 37.5 mg, Oral, DAILY      Allergies   Allergen Reactions   . Cephalosporins Other (See Comments)     Severe confusion   . Venom-Wasp Hives   . Pollen Extracts Itching   . Proton Pump Inhibitors Rash   . Statins-Hmg-Coa Reductase Inhibitors Rash   . Venom-Honey Bee Unknown and Other (See Comments)     Hornets    Hornets       Objective:     Vitals  Modify Vitals <redacted file path>  height is 4\' 11"  (1.499 m) and weight is 133 lb (60.3 kg). Her temperature is 36.6 C (97.8 F). Her blood pressure is 128/80 and her pulse is 80. Her oxygen saturation is 92%.  Body mass index is 26.86 kg/m.       Physical Exam  Vitals reviewed.   Constitutional:       General: She is not in acute distress.     Appearance: Normal appearance. She is well-developed. She is not ill-appearing or toxic-appearing.   HENT:      Head: Normocephalic.  Right Ear: Tympanic membrane, ear canal and external ear normal.      Left Ear: Tympanic membrane, ear  canal and external ear normal.      Mouth/Throat:      Mouth: Mucous membranes are moist.   Eyes:      General: No scleral icterus.     Conjunctiva/sclera: Conjunctivae normal.   Neck:      Thyroid: No thyroid mass or thyromegaly.      Vascular: No carotid bruit.   Cardiovascular:      Rate and Rhythm: Normal rate and regular rhythm.      Pulses: Normal pulses.      Heart sounds: Normal heart sounds. No murmur heard.  Pulmonary:      Effort: Pulmonary effort is normal. No respiratory distress.      Breath sounds: Normal breath sounds. No wheezing or rhonchi.   Abdominal:      General: Bowel sounds are normal.      Palpations: Abdomen is soft. There is no hepatomegaly.      Tenderness: There is no abdominal tenderness. There is no guarding or rebound.   Musculoskeletal:      Right hand: Normal.      Cervical back: Neck supple.      Lumbar back: No tenderness or bony tenderness.      Right hip: No deformity, bony tenderness or crepitus. Normal range of motion. Normal strength.      Right lower leg: No edema.      Left lower leg: No edema.        Legs:    Lymphadenopathy:      Cervical: No cervical adenopathy.   Skin:     General: Skin is warm.      Capillary Refill: Capillary refill takes less than 2 seconds.      Findings: No rash.   Neurological:      Mental Status: She is alert and oriented to person, place, and time.      Motor: Motor function is intact. No weakness or tremor.   Psychiatric:         Mood and Affect: Mood normal.         Behavior: Behavior normal.         Thought Content: Thought content normal.         Judgment: Judgment normal.                 Assessment/Plan <redacted file path>   Clinical References <redacted file path>*   Pt Intr & Followup <redacted file path>*   Wrap Up <redacted file path>*     Issues Addressed/ Plan <redacted file path>      Diagnosis Plan   1. Yeast infection  fluconazole 150 MG Oral tablet      2. Gait instability  Durable Medical Equipment      3. Need for influenza  vaccination  Flu Vaccine, High Dose Trivalent (FLUZONE HD)      4. Hematoma of right thigh, subsequent encounter           Yeast infection   - will treat with fluconazole 150 mg once    2. Falls/gait instability   - hematoma on right posterior thigh- she believes pain started when she stood up from the couch- recommend [place] to check for muscle tear- she would like to hold off for now and says it is starting to feel better   - recommend she call with worsening pain   - recommend  PT but she declines   - script for cane given    Medications, allergies (including any changes made during the visit) and test results (if done recently) were reviewed. Patient education was provided as appropriate, the patient's questions were answered and the patient agreed with today's plan for the visit.    Follow up as scheduled or sooner prn  Call the office with any questions or concerns  Go to the ED with any emergency           Follow up  recommended <redacted file path> Return if symptoms worsen or fail to improve.     Lorie Phenix, PA-C

## 2023-08-22 ENCOUNTER — Encounter: Payer: Self-pay | Admitting: Emergency Medicine

## 2023-08-23 NOTE — Telephone Encounter (Signed)
Last Visit: 08/21/2023   Upcoming Visit: 10/27/2023       Front:      yes:  The patient currently uses this medication at this dose.  Verification completed that medication refill request exactly matches medication on medication list     Lab Results   Component Value Date    LDLC 68 07/13/2023    TSH 1.67 07/13/2023    GLU 87 07/13/2023    CA 8.8 07/13/2023    NA 140 07/13/2023    K 4.2 07/13/2023    CL 106 07/13/2023    BUN 8 (L) 07/13/2023    CREAT 0.7 07/13/2023    AST 19 07/13/2023       BP Readings from Last 1 Encounters:   08/21/23 128/80

## 2023-08-23 NOTE — Telephone Encounter (Signed)
Call Information    What is the primary reason you are calling today?: Medication Refill

## 2023-08-29 ENCOUNTER — Observation Stay: Payer: Medicare (Managed Care)

## 2023-08-29 ENCOUNTER — Emergency Department: Payer: Medicare (Managed Care)

## 2023-08-29 ENCOUNTER — Observation Stay
Admission: EM | Admit: 2023-08-29 | Discharge: 2023-09-01 | Disposition: A | Discharge status: Home / Self Care | Payer: Medicare (Managed Care) | Source: Ambulatory Visit | Attending: Family | Admitting: Family

## 2023-08-29 ENCOUNTER — Encounter: Payer: Self-pay | Admitting: Gastroenterology

## 2023-08-29 ENCOUNTER — Other Ambulatory Visit: Payer: Self-pay

## 2023-08-29 DIAGNOSIS — M6282 Rhabdomyolysis: Principal | ICD-10-CM | POA: Insufficient documentation

## 2023-08-29 DIAGNOSIS — F17210 Nicotine dependence, cigarettes, uncomplicated: Secondary | ICD-10-CM | POA: Insufficient documentation

## 2023-08-29 DIAGNOSIS — Y9389 Activity, other specified: Secondary | ICD-10-CM | POA: Insufficient documentation

## 2023-08-29 DIAGNOSIS — F319 Bipolar disorder, unspecified: Secondary | ICD-10-CM | POA: Insufficient documentation

## 2023-08-29 DIAGNOSIS — M25551 Pain in right hip: Secondary | ICD-10-CM | POA: Insufficient documentation

## 2023-08-29 DIAGNOSIS — R42 Dizziness and giddiness: Secondary | ICD-10-CM

## 2023-08-29 DIAGNOSIS — W19XXXA Unspecified fall, initial encounter: Secondary | ICD-10-CM | POA: Diagnosis present

## 2023-08-29 DIAGNOSIS — Z79899 Other long term (current) drug therapy: Secondary | ICD-10-CM | POA: Insufficient documentation

## 2023-08-29 DIAGNOSIS — R531 Weakness: Secondary | ICD-10-CM

## 2023-08-29 DIAGNOSIS — Z66 Do not resuscitate: Secondary | ICD-10-CM | POA: Insufficient documentation

## 2023-08-29 DIAGNOSIS — F411 Generalized anxiety disorder: Secondary | ICD-10-CM | POA: Diagnosis present

## 2023-08-29 DIAGNOSIS — I2510 Atherosclerotic heart disease of native coronary artery without angina pectoris: Secondary | ICD-10-CM | POA: Diagnosis present

## 2023-08-29 DIAGNOSIS — J449 Chronic obstructive pulmonary disease, unspecified: Secondary | ICD-10-CM | POA: Insufficient documentation

## 2023-08-29 DIAGNOSIS — Y998 Other external cause status: Secondary | ICD-10-CM | POA: Insufficient documentation

## 2023-08-29 DIAGNOSIS — I517 Cardiomegaly: Secondary | ICD-10-CM

## 2023-08-29 DIAGNOSIS — M85851 Other specified disorders of bone density and structure, right thigh: Secondary | ICD-10-CM

## 2023-08-29 DIAGNOSIS — R296 Repeated falls: Principal | ICD-10-CM | POA: Insufficient documentation

## 2023-08-29 DIAGNOSIS — W1830XA Fall on same level, unspecified, initial encounter: Secondary | ICD-10-CM | POA: Insufficient documentation

## 2023-08-29 DIAGNOSIS — I10 Essential (primary) hypertension: Secondary | ICD-10-CM | POA: Insufficient documentation

## 2023-08-29 DIAGNOSIS — Y92009 Unspecified place in unspecified non-institutional (private) residence as the place of occurrence of the external cause: Secondary | ICD-10-CM | POA: Insufficient documentation

## 2023-08-29 DIAGNOSIS — F419 Anxiety disorder, unspecified: Secondary | ICD-10-CM | POA: Insufficient documentation

## 2023-08-29 DIAGNOSIS — I671 Cerebral aneurysm, nonruptured: Secondary | ICD-10-CM | POA: Insufficient documentation

## 2023-08-29 DIAGNOSIS — E785 Hyperlipidemia, unspecified: Secondary | ICD-10-CM | POA: Insufficient documentation

## 2023-08-29 DIAGNOSIS — S098XXA Other specified injuries of head, initial encounter: Secondary | ICD-10-CM

## 2023-08-29 DIAGNOSIS — M79651 Pain in right thigh: Secondary | ICD-10-CM | POA: Insufficient documentation

## 2023-08-29 DIAGNOSIS — Z9181 History of falling: Secondary | ICD-10-CM | POA: Insufficient documentation

## 2023-08-29 DIAGNOSIS — R4182 Altered mental status, unspecified: Secondary | ICD-10-CM

## 2023-08-29 LAB — URINALYSIS REFLEX TO CULTURE
Glucose,UA: NEGATIVE
Leuk Esterase,UA: NEGATIVE
Nitrite,UA: NEGATIVE
Protein,UA: NEGATIVE
Specific Gravity,UA: 1.01 (ref 1.002–1.030)
pH,UA: 6.5 (ref 5.0–8.0)

## 2023-08-29 LAB — CBC AND DIFFERENTIAL
Baso # K/uL: 0 10*3/uL (ref 0.0–0.2)
Eos # K/uL: 0 10*3/uL (ref 0.0–0.5)
Hematocrit: 52 % — ABNORMAL HIGH (ref 34–49)
Hemoglobin: 16.4 g/dL — ABNORMAL HIGH (ref 11.2–16.0)
IMM Granulocytes #: 0 10*3/uL (ref 0.0–0.0)
IMM Granulocytes: 0.3 %
Lymph # K/uL: 0.7 10*3/uL — ABNORMAL LOW (ref 1.0–5.0)
MCV: 87 fL (ref 75–100)
Mono # K/uL: 0.5 10*3/uL (ref 0.1–1.0)
Neut # K/uL: 4.8 10*3/uL (ref 1.5–6.5)
Nucl RBC # K/uL: 0 10*3/uL (ref 0.0–0.0)
Nucl RBC %: 0 /100{WBCs} (ref 0.0–0.2)
Platelets: 206 10*3/uL (ref 150–450)
RBC: 6 MIL/uL — ABNORMAL HIGH (ref 4.0–5.5)
RDW: 18.4 % — ABNORMAL HIGH (ref 0.0–15.0)
Seg Neut %: 80.4 %
WBC: 6 10*3/uL (ref 3.5–11.0)

## 2023-08-29 LAB — URINE MICROSCOPIC (IQ200)

## 2023-08-29 LAB — COMPREHENSIVE METABOLIC PANEL
ALT: 19 U/L (ref 0–35)
AST: 99 U/L — ABNORMAL HIGH (ref 0–35)
Albumin: 3.7 g/dL (ref 3.5–5.2)
Alk Phos: 71 U/L (ref 35–105)
Anion Gap: 16 (ref 7–16)
Bilirubin,Total: 0.6 mg/dL (ref 0.0–1.2)
CO2: 26 mmol/L (ref 20–28)
Calcium: 9.6 mg/dL (ref 8.6–10.2)
Chloride: 98 mmol/L (ref 96–108)
Creatinine: 0.46 mg/dL — ABNORMAL LOW (ref 0.51–0.95)
Glucose: 105 mg/dL — ABNORMAL HIGH (ref 60–99)
Lab: 8 mg/dL (ref 6–20)
Potassium: 4 mmol/L (ref 3.3–4.6)
Sodium: 140 mmol/L (ref 133–145)
Total Protein: 7.1 g/dL (ref 6.3–7.7)
eGFR BY CREAT: 99 *

## 2023-08-29 LAB — EKG 12-LEAD
P: 62 deg
PR: 148 ms
QRS: 39 deg
QRSD: 92 ms
QT: 357 ms
QTc: 437 ms
Rate: 90 {beats}/min
T: 36 deg

## 2023-08-29 LAB — TROPONIN T 3 HR W/ DELTA HIGH SENSITIVITY (IP/ED ONLY)
TROP T 0-3 HR DELTA High Sensitivity: 1 (ref 0–4)
TROP T 3 HR High Sensitivity: 44 ng/L — ABNORMAL HIGH (ref 0–14)

## 2023-08-29 LAB — LACTATE, PLASMA: Lactate: 2 mmol/L (ref 0.5–2.2)

## 2023-08-29 LAB — TROPONIN T 0 HR HIGH SENSITIVITY (IP/ED ONLY): TROP T 0 HR High Sensitivity: 43 ng/L — ABNORMAL HIGH (ref 0–11)

## 2023-08-29 LAB — CK: CK: 2844 U/L — ABNORMAL HIGH (ref 26–192)

## 2023-08-29 LAB — PHOSPHORUS: Phosphorus: 2.1 mg/dL — ABNORMAL LOW (ref 2.7–4.5)

## 2023-08-29 LAB — MAGNESIUM: Magnesium: 1.7 mg/dL (ref 1.6–2.5)

## 2023-08-29 MED ORDER — BUDESONIDE 0.5 MG/2ML IN SUSP *I*
0.5000 mg | Freq: Two times a day (BID) | RESPIRATORY_TRACT | Status: DC
Start: 2023-08-29 — End: 2023-08-29

## 2023-08-29 MED ORDER — OXYCODONE HCL 5 MG PO TABS *I*
5.0000 mg | ORAL_TABLET | Freq: Once | ORAL | Status: AC
Start: 2023-08-29 — End: 2023-08-29
  Administered 2023-08-29: 5 mg via ORAL
  Filled 2023-08-29: qty 1

## 2023-08-29 MED ORDER — IPRATROPIUM-ALBUTEROL 0.5-2.5 MG/3ML IN SOLN *I*
3.0000 mL | RESPIRATORY_TRACT | Status: DC | PRN
Start: 2023-08-29 — End: 2023-09-01

## 2023-08-29 MED ORDER — METOPROLOL SUCCINATE 25 MG PO TB24 *I*
25.0000 mg | ORAL_TABLET | Freq: Every day | ORAL | Status: DC
Start: 2023-08-29 — End: 2023-09-01
  Administered 2023-08-29 – 2023-09-01 (×4): 25 mg via ORAL
  Filled 2023-08-29 (×4): qty 1

## 2023-08-29 MED ORDER — OXYCODONE HCL 5 MG PO TABS *I*
5.0000 mg | ORAL_TABLET | Freq: Four times a day (QID) | ORAL | Status: DC | PRN
Start: 2023-08-29 — End: 2023-09-01
  Administered 2023-08-30 – 2023-09-01 (×6): 5 mg via ORAL
  Filled 2023-08-29 (×6): qty 1

## 2023-08-29 MED ORDER — ALPRAZOLAM 0.5 MG PO TABS *I*
0.5000 mg | ORAL_TABLET | Freq: Two times a day (BID) | ORAL | Status: AC | PRN
Start: 2023-08-29 — End: 2023-09-01
  Administered 2023-08-31 – 2023-09-01 (×3): 0.5 mg via ORAL
  Filled 2023-08-29 (×3): qty 1

## 2023-08-29 MED ORDER — AMLODIPINE BESYLATE 2.5 MG PO TABS *I*
2.5000 mg | ORAL_TABLET | Freq: Every day | ORAL | Status: DC
Start: 2023-08-30 — End: 2023-10-29
  Administered 2023-08-30: 2.5 mg via ORAL
  Filled 2023-08-29: qty 1

## 2023-08-29 MED ORDER — SODIUM CHLORIDE 0.9 % IV SOLN WRAPPED *I*
80.0000 mL/h | Status: DC
Start: 2023-08-29 — End: 2023-08-31
  Administered 2023-08-29 – 2023-08-30 (×3): 100 mL/h via INTRAVENOUS
  Administered 2023-08-30 – 2023-08-31 (×3): 80 mL/h via INTRAVENOUS

## 2023-08-29 MED ORDER — ASPIRIN 81 MG PO TBEC *I*
81.0000 mg | DELAYED_RELEASE_TABLET | Freq: Every day | ORAL | Status: DC
Start: 2023-08-29 — End: 2023-09-01
  Administered 2023-08-29 – 2023-09-01 (×4): 81 mg via ORAL
  Filled 2023-08-29 (×4): qty 1

## 2023-08-29 MED ORDER — PREGABALIN 100 MG PO CAPS *I*
100.0000 mg | ORAL_CAPSULE | Freq: Three times a day (TID) | ORAL | Status: AC
Start: 2023-08-29 — End: 2023-09-01
  Administered 2023-08-29 – 2023-09-01 (×9): 100 mg via ORAL
  Filled 2023-08-29 (×9): qty 1

## 2023-08-29 MED ORDER — IOHEXOL 350 MG/ML (OMNIPAQUE) IV SOLN 500ML BOTTLE *I*
1.0000 mL | Freq: Once | INTRAVENOUS | Status: AC
Start: 2023-08-29 — End: 2023-08-29
  Administered 2023-08-29: 70 mL via INTRAVENOUS

## 2023-08-29 MED ORDER — VENLAFAXINE HCL 37.5 MG PO CP24 *I*
37.5000 mg | ORAL_CAPSULE | Freq: Every day | ORAL | Status: DC
Start: 2023-08-30 — End: 2023-09-01
  Administered 2023-08-30 – 2023-09-01 (×3): 37.5 mg via ORAL
  Filled 2023-08-29 (×3): qty 1

## 2023-08-29 MED ORDER — ACETAMINOPHEN 325 MG PO TABS *I*
650.0000 mg | ORAL_TABLET | Freq: Four times a day (QID) | ORAL | Status: DC | PRN
Start: 2023-08-29 — End: 2023-09-01
  Administered 2023-08-30 – 2023-09-01 (×3): 650 mg via ORAL
  Filled 2023-08-29 (×3): qty 2

## 2023-08-29 MED ORDER — ISOSORBIDE MONONITRATE CR 30 MG PO TB24 *I*
30.0000 mg | ORAL_TABLET | Freq: Every day | ORAL | Status: DC
Start: 2023-08-29 — End: 2023-09-01
  Administered 2023-08-29 – 2023-09-01 (×4): 30 mg via ORAL
  Filled 2023-08-29 (×4): qty 1

## 2023-08-29 MED ORDER — ACETAMINOPHEN 500 MG PO TABS *I*
1000.0000 mg | ORAL_TABLET | Freq: Once | ORAL | Status: AC
Start: 2023-08-29 — End: 2023-08-29
  Administered 2023-08-29: 1000 mg via ORAL
  Filled 2023-08-29: qty 2

## 2023-08-29 MED ORDER — ACETAMINOPHEN 650 MG RE SUPP *I*
650.0000 mg | Freq: Four times a day (QID) | RECTAL | Status: DC | PRN
Start: 2023-08-29 — End: 2023-09-01

## 2023-08-29 MED ORDER — MELATONIN 3 MG PO TABS *I*
6.0000 mg | ORAL_TABLET | Freq: Every evening | ORAL | Status: DC | PRN
Start: 2023-08-29 — End: 2023-09-01

## 2023-08-29 MED ORDER — ENOXAPARIN SODIUM 40 MG/0.4ML IJ SOSY *I*
40.0000 mg | PREFILLED_SYRINGE | Freq: Every day | INTRAMUSCULAR | Status: DC
Start: 2023-08-29 — End: 2023-09-01
  Administered 2023-08-29 – 2023-08-31 (×3): 40 mg via SUBCUTANEOUS
  Filled 2023-08-29 (×3): qty 0.4

## 2023-08-29 MED ORDER — IPRATROPIUM-ALBUTEROL 0.5-2.5 MG/3ML IN SOLN *I*
3.0000 mL | Freq: Four times a day (QID) | RESPIRATORY_TRACT | Status: DC
Start: 2023-08-29 — End: 2023-08-29

## 2023-08-29 MED ORDER — SODIUM CHLORIDE 0.9 % IV BOLUS *I*
1000.0000 mL | Freq: Once | Status: AC
Start: 2023-08-29 — End: 2023-08-29
  Administered 2023-08-29: 1000 mL via INTRAVENOUS

## 2023-08-29 NOTE — ED Notes (Addendum)
Upon arrival pt was able to answer questions appropriately and was a/o. Pt had roughly a 15 minute episode at 0600 where she was lethargic, only opening L eye slightly, and had soft, slurred and incomprehensible speech. MD was at bedside during episode. Pt is now back to baseline and now c/o her "tongue feels tingly."

## 2023-08-29 NOTE — Progress Notes (Signed)
Patient arrived to unit by stretcher at approx 1510, a&ox3, able to make needs known. 20G IV LAC with NS infusing @ 100/hr. Reported to have fallen at home x2, lives by herself. Room air, states low back pain but denies medication at this time. No advanced directives on file, provider aware. Large purple/red bruise noted on inner thigh/knee area from fall. Large amount of dry, flaky skin with red spots noted on bilateral lower extremities and mid area of arms, patient stated "it's always like that". No belongings to document, will need placement after discharge

## 2023-08-29 NOTE — H&P (Signed)
Patient Name: Kylie Parker Date of Admission: 08/29/2023   DOB: March 19, 1946 Primary Care Physician: Lorie Phenix, PA   Patient MR#: [id number]  Attending Physician: Caron Presume, MD      [facility] Internal Medicine History and Physical Note     Chief Complaint     Fall    History of Presenting Illness       Kylie Parker is a 77 y.o. female with past medical history significant for HTN, CAD, COPD, anxiety    Patient presented to [name] on 08/29/23 who was brought in by EMS after being found down at home. Patient reporting have recurrent falls recently. Reporting that she doesn't know how she fell this time, as it was early in the morning. Stating that she "always falls to her right side". Last fall prior to this she states that she fell getting up from the couch. Denies any recent illness, no fever, chills, chest pain, shortness of breath, abdominal pain, nausea, vomiting, urinary or bowel changes. States that she does take percocet for chronic back pain and recently had her dose increased. Denies any loss of consciousness with falls or hitting her head. Denies any changes in sensation in arms or legs, or one side being weaker than the other.      ED course:   Work up with labs noted for elevated CK of 2844, flat troponin trend, but otherwise stable to baseline. UA doesn't appear to be infectious.   Xrays of right femur and pelvis with no fractures.  CT head with no acute hemorrhage or transcortical infarct.  CTA head/neck noted 4mm saccular aneurysm, no vascular occulsion. Neurology consulted, Dr. Durward Mallard, recommending MRI brain and outpatient referral to neurosurgery for aneurysm finding.    14 Point Review of Systems     Positive symptoms denoted in RED. Negative symptoms denoted in BLACK    Constitutional weight loss, weight gain, fever, chills, malaise   Eyes diplopia, blurred vision   Ear/Nose/Throat  hearing loss, ringing in ears, sinus congestion, loss of smell, sore throat,  altered taste   Gastrointestinal nausea, vomiting, diarrhea, constipation, abdominal pain   Integumentary skin rash, mole, dryness, pigmentation   Endocrine polyuria, polydipsia, cold-intolerance, heat-intolerance   Genitourinary  hematuria, incontinence, dysuria   Hematologic/Lymphatic bruising, bleeding   Cardiovascular  chest pain, palpitations, PND, orthopnea   Respiratory shortness of breath, cough, wheezing    Musculoskeletal  Right hip pain, joint stiffness, joint swelling, myalgias   Neurological dizziness, syncope, seizure, vertigo, weakness, loss of sensation,   Psychiatric depression, anxiety, hallucination   Allergies/Immunologic  hives, swelling of lips, swelling of tongue      Past Medical History Past Surgical History   Past Medical History:   Diagnosis Date    ADHD (attention deficit hyperactivity disorder)     Angina     Arthritis     Bipolar 1 disorder     Chest pain, unspecified     pressure    COPD (chronic obstructive pulmonary disease)     Fibromyalgia     Hypertension     Shortness of breath     Past Surgical History:   Procedure Laterality Date    COLON SURGERY      abcess    HAND SURGERY Bilateral     trigger finger bilateral    TONSILLECTOMY        Social History Family History   Social History     Socioeconomic History    Marital status: Divorced  Tobacco Use    Smoking status: Every Day     Packs/day: 1.00     Years: 50.00     Additional pack years: 0.00     Total pack years: 50.00     Types: Cigarettes    Smokeless tobacco: Never   Substance and Sexual Activity    Alcohol use: No    Drug use: No    Sexual activity: Not Currently      Family History   Problem Relation Age of Onset    Heart Disease Mother     Heart Disease Father     Cancer Sister         Leukemia        Allergies     Allergies   Allergen Reactions    Cephalosporins Other (See Comments)     Severe confusion    Seasonal Allergies Itching    Acid Blockers Support Rash    Bee Venom Other (See Comments)     Hornets    Proton  Pump Inhibitors Rash     Medications     Hospital administered medications:  Scheduled Meds:   [START ON 08/30/2023] amLODIPine  2.5 mg Oral Daily    aspirin  81 mg Oral Daily    isosorbide mononitrate  30 mg Oral Daily    pregabalin  100 mg Oral TID    [START ON 08/30/2023] venlafaxine  37.5 mg Oral Daily with breakfast    metoprolol succinate ER  25 mg Oral Daily    enoxaparin  40 mg Subcutaneous Daily @ 2100     PRN Meds:   ALPRAZolam  0.5 mg Oral BID PRN    acetaminophen  650 mg Oral Q6H PRN    Or    acetaminophen  650 mg Rectal Q6H PRN    oxyCODONE  5 mg Oral Q6H PRN    melatonin  6 mg Oral QHS PRN    ipratropium-albuterol  3 mL Nebulization Q4H PRN     Continuous Infusions:   sodium chloride 100 mL/hr (08/29/23 1625)     Home Medications:  Prior to Admission medications    Medication Sig Start Date End Date Taking? Authorizing Provider   amLODIPine 2.5 mg tablet Take 1 tablet (2.5 mg total) by mouth daily. 07/14/23  Yes [provider]   venlafaxine 37.5 mg 24 hr capsule Take 1 capsule (37.5 mg total) by mouth daily.   Yes [provider]   LYRICA 100 MG capsule Take 1 capsule (100 mg total) by mouth 3 times daily.   Yes [provider]   oxyCODONE-acetaminophen (PERCOCET) 10-325 mg per tablet Take 1 tablet by mouth every 8 hours as needed for Pain. 07/21/23  Yes [provider]   lisinopril (PRINIVIL,ZESTRIL) 40 mg tablet  08/21/20  Yes [provider]   metoprolol (TOPROL-XL) 25 MG 24 hr tablet Take 1 tablet (25 mg total) by mouth daily. Do not crush or chew. May be divided.   Yes [provider]   isosorbide mononitrate (IMDUR) 30 MG 24 hr tablet Take 1 tablet (30 mg total) by mouth daily.   Yes [provider]   albuterol-ipratropium (COMBIVENT) 18-103 MCG/ACT inhaler Inhale 2 puffs into the lungs daily. Shake well before each use.   Yes [provider]   alprazolam Prudy Feeler) 0.5 MG tablet Take 1 tablet (0.5 mg total) by mouth 2 times daily  as needed. 03/19/10  Yes [provider]   ezetimibe (ZETIA) 10 MG tablet Take  1 tablet (10 mg total) by mouth daily.   Yes [provider]   aspirin 81 MG EC tablet Take 1 tablet (81 mg total) by mouth daily.   Yes [provider]   ondansetron (ZOFRAN) 8 mg tablet Take 1 tablet (8 mg total) by mouth 3 times daily. 08/21/20   [provider]   melatonin 3 MG Take 1 tablet (3 mg total) by mouth nightly as needed.    [provider]   Ascorbic Acid (VITAMIN C) 250 MG tablet Take 1 tablet (250 mg total) by mouth daily.    [provider]   Calcium Carbonate-Vitamin D (CALCIUM + D) 600-200 MG-UNIT per tablet Take 2 tablets by mouth daily.    [provider]   Multiple Vitamin (MULTIVITAMIN) per tablet Take 1 tablet by mouth daily.    [provider]      Physical Exam     Vital Signs:  Vitals:    08/29/23 1425 08/29/23 1452 08/29/23 1500 08/29/23 1530   BP: 149/89  154/83 154/83   BP Location:   Left arm Left arm   Pulse: (!) 111 91  (!) 112   Resp: 20 19     Temp:   35.9 C (96.7 F)    TempSrc:   Tympanic    SpO2:  93%  92%   Weight:       Height:         Oxygen Requirement:        Weight:  Wt Readings from Last 3 Encounters:   08/29/23 60.3 kg (133 lb)   08/15/23 64.9 kg (143 lb)   04/18/23 63 kg (139 lb)     General: Not in acute distress. Conversant and able to make needs known.  HEENT: Normocephalic, atraumatic. PERRLA. EOMI. No lesions on nose or ears. MMM  NECK: Supple, trachea is in midline, no palpable lymphadenopathy.  CVS: S1 S2 heard, RRR, no appreciable murmurs/rubs/gallops  PULM: normal effort, CTAB, with no appreciable wheezes, rales, or rhonchi  ABD: Bowel sounds active, soft, non tender, not distended. No guarding rigidity or rebound tenderness.   EXT: No clubbing, cyanosis, or edema. Distal pulses 2+  NEURO: A and O to person, place, time and current events. Cranial nerves II-XII are grossly intact. Sensations intact. Strength 4/5  in bilateral upper and 3-4/5 in lower extremities.   MSK: No joint swelling or tenderness. ROM intact throughout.   SKIN: Warm, dry and intact with no rashes or ulcers.   PSYCH: Appropriate mood and affect.    Foley: no    Labs     CBC BMP   Recent Labs   Lab 08/29/23  0603   WBC 6.0   Hemoglobin 16.4*   Hematocrit 52*   Platelets 206        Recent Labs   Lab 08/29/23  0603   Sodium 140   Potassium 4.0   Chloride 98   CO2 26   UN 8   Creatinine 0.46*   Glucose 105*   Calcium 9.6   Albumin 3.7   Phosphorus 2.1*        Coags LFTs   No results for input(s): "APTT", "INR", "PTT", "PTI" in the last 168 hours.     Recent Labs   Lab 08/29/23  0603   Alk Phos 71   Bilirubin,Total 0.6   Albumin 3.7   ALT 19   AST 99*   Total Protein 7.1  Cardiac Enzymes Other   No results for input(s): "CKTS", "TROPU", "TROP", "MCKMB", "CKMB" in the last 168 hours.    No components found with this basename: "RICKMBS"   ABG:   Recent Labs   Lab 08/29/23  0600   Lactate 2.0          Microbiology results   Bacterial Blood Culture   Date Value Ref Range Status   08/29/2023 .  Preliminary   08/29/2023 .  Preliminary          Radiology Results   Pelvis standard AP view    Result Date: 08/29/2023  No acute fracture, dislocation, or diastasis. END OF IMPRESSION I have personally reviewed the images and the Resident's/Fellow's interpretation and agree with or edited the findings.       [facility] submits this DICOM format image data and final report to the [facility], an independent secure electronic health information exchange, on a reciprocally searchable basis (with patient authorization) for a minimum of 12 months after exam date.    * Femur RIGHT standard AP and Lateral    Result Date: 08/29/2023  Osteopenia. No acute fracture is seen. END OF IMPRESSION I have personally reviewed the images and the Resident's/Fellow's interpretation and agree with or edited the findings.       [facility] submits this DICOM format image data and final  report to the [facility], an independent secure electronic health information exchange, on a reciprocally searchable basis (with patient authorization) for a minimum of 12 months after exam date.    CT angio neck carotid for stroke    Result Date: 08/29/2023  4 mm saccular aneurysm projecting inferolaterally near the MCA trifurcation. Patent anterior and posterior circulations of the brain, without large vessel occlusion. Patent carotid and vertebral arteries of the neck, without stenosis or dissection. A presumed web is present at the right carotid bifurcation. These findings were communicated to Dr. Bonney Leitz by clerical staff at approximately 9:54 a.m. END OF IMPRESSION I have personally reviewed the images and the Resident's/Fellow's interpretation and agree with or edited the findings.       [facility] submits this DICOM format image data and final report to the [facility], an independent secure electronic health information exchange, on a reciprocally searchable basis (with patient authorization) for a minimum of 12 months after exam date.    CT angio head for stroke    Result Date: 08/29/2023  4 mm saccular aneurysm projecting inferolaterally near the MCA trifurcation. Patent anterior and posterior circulations of the brain, without large vessel occlusion. Patent carotid and vertebral arteries of the neck, without stenosis or dissection. A presumed web is present at the right carotid bifurcation. These findings were communicated to Dr. Bonney Leitz by clerical staff at approximately 9:54 a.m. END OF IMPRESSION I have personally reviewed the images and the Resident's/Fellow's interpretation and agree with or edited the findings.       [facility] submits this DICOM format image data and final report to the [facility] an independent secure electronic health information exchange, on a reciprocally searchable basis (with patient authorization) for a minimum of 12 months after exam date.    EKG 12  lead (initial)    Result Date: 08/29/2023  Sinus rhythm Left atrial enlargement    *Chest STANDARD single view    Result Date: 08/29/2023  No acute cardiopulmonary disease. END OF IMPRESSION       [facility] submits this DICOM format image data and  final report to the [facility], an independent secure electronic health information exchange, on a reciprocally searchable basis (with patient authorization) for a minimum of 12 months after exam date.    CT head without contrast for stroke    Result Date: 08/29/2023  No acute/subacute transcortical infarct. No acute intracranial hemorrhage. Mild generalized brain volume loss. Mild chronic microvascular white matter changes. END OF IMPRESSION I have personally reviewed the images and the Resident's/Fellow's interpretation and agree with or edited the findings.       [facility] submits this DICOM format image data and final report to the [facility], an independent secure electronic health information exchange, on a reciprocally searchable basis (with patient authorization) for a minimum of 12 months after exam date.   Hospital Problems     Active Hospital Problems    Diagnosis     *!*Rhabdomyolysis     Fall     Fall, initial encounter     COPD (chronic obstructive pulmonary disease)     Hypertension     Coronary arteriosclerosis     Generalized anxiety disorder      Assessment and Plan     Recurrent falls, mild rhabdomyolysis  -Noted for elevated CK of 2844  -Xrays of right femur and pelvis with no fractures.  -CT head with no acute hemorrhage or transcortical infarct.  -CTA head/neck noted 4mm saccular aneurysm, no vascular occulsion. -Neurology consulted, Dr. Durward Mallard, recommending MRI brain and outpatient referral to neurosurgery for aneurysm finding.  -Providing IV fluid hydration  -MRI brain ordered  -Check orthostatics, patient on multiple blood pressure medications that may be contributing to dizziness and subsequent falls  -PT/OT ordered to  evaluate    COPD  -No current wheezing  -Chest xray on admission unremarkable  -Patient reporting that she does not use her inhaler at home  -PRN nebs available    HTN  -Continue amlodipine 2.5mg , Metoprolol succinate 25mg  daily, and isosorbide mononitrate 30mg  daily  -Holding lisinopril 40mg  daily due to soft BP on arrival and recurrent falls, along with appearing dry on exam. Will restart if BP warrants.    HLD  -Home ezetimibe nonformulary, restart at discharge    Anxiety  -Continue home venlafexine 37.5mg  daily and xanax 0.5mg  prn    Fluids & Electrolytes: NS at 166ml/hr  Nutrition: Diet regular  Mobility Plan/Ability: Up with assistance.   DVT prophylaxis: Lovenox    Code Status: DNR/DNI     Disposition: Admit patient as observation status. Anticipate 24-48 hour hospital stay.     Total care time > 55 minutes today, > 50% spent counseling and coordinating care.  Case discussed with Dr. Koleen Distance. Patient evaluated by Dr. Koleen Distance, who is in agreement with assessment and plan.    Signed:   Valeda Malm, NP  08/29/2023  6:15 PM

## 2023-08-29 NOTE — Progress Notes (Signed)
Current use for oxygen/CPAP/Bipap: Room air    Patient home use for oxygen/Bipap/ CPAP: NA    Patient takes the following respiratory medications at home: NA    RT will continue  follow patient throughout patient visit.

## 2023-08-29 NOTE — Plan of Care (Signed)
Problem: Safety  Goal: Patient will remain free of falls  Outcome: Progressing towards goal  Goal: Prevent any intentional injury  Outcome: Progressing towards goal     Problem: Pain/Comfort  Goal: Patient's pain or discomfort is manageable  Outcome: Progressing towards goal     Problem: Nutrition  Goal: Nutritional status is maintained or improved - Geriatric  Outcome: Progressing towards goal     Problem: Mobility  Goal: Functional status is maintained or improved - Geriatric  Outcome: Progressing towards goal     Problem: Psychosocial  Goal: Demonstrates ability to cope with illness  Outcome: Progressing towards goal     Problem: Cognitive function  Goal: Cognitive function will be maintained or return to baseline  Description: Interventions:  Delirium Assessment  LIVEBAR Assessment    Outcome: Progressing towards goal  Goal: Lines and tethers will be removed as appropriate  Outcome: Progressing towards goal  Goal: Patient maintains appropriate nutritional intake  Outcome: Progressing towards goal  Goal: Vital signs will be within normal limits  Outcome: Progressing towards goal  Goal: Evidence for potential causes of delirium will be managed  Outcome: Progressing towards goal  Goal: Behaviors will return to baseline  Outcome: Progressing towards goal  Goal: Ambulation and mobility will be maintained  Outcome: Progressing towards goal  Goal: Retention of urine and constipation will be managed  Outcome: Progressing towards goal     Problem: Potential Alteration in Skin Integrity - Pressure Ulcer  Goal: The patient will maintain intact skin free of pressure ulcer  Outcome: Progressing towards goal     Problem: Risk for Impaired Sleep/Wake Cycle  Goal: The patient will maintain an adequate sleep/wake cycle  Outcome: Progressing towards goal     Problem: Bowel Elimination  Goal: Elimination pattern is normal or improving  Outcome: Progressing towards goal     Problem: Bladder Elimination  Goal: Patient is able to  empty bladder or return to baseline  Outcome: Progressing towards goal     Problem: Risk for Falls  Goal: No falls during hospitalization  Description: Patient will not fall during hospitalization.  Outcome: Progressing towards goal     Problem: Knowledge Deficit  Goal: Knowledge - personal safety  Description: Patient will verbalize understanding of fall prevention.  Outcome: Progressing towards goal

## 2023-08-29 NOTE — Provider Consult (Signed)
Teleneurology Telephone Consultation Note        Patient:  Kylie Parker  Patient's DOB:  Jan 12, 1946  eMRN:  [id number]  Physician/APP:  Dr. Francis Gaines:  [facility]    I spoke with the provider on 08/29/2023 at 11:39 and received the following information.    History Obtained        The patient is a 77 y.o. female with history of ADHD, bipolar 1 disorder, COPD (not on home oxygen), hypertension and fibromyalgia. The patient reported a fall at home, unclear timeframe of the fall. She pressed her life alert as she was unable to get up. Presently, she is alert, oriented, and describes feeling off-balance intermittently, sometimes resulting in falls. No clear description of vertigo, as she describes feeling "light headed" but does say that intermittently she will fall to the right. No associated dysarthria, dysphagia, or diplopia. No new weakness or sensory symptoms. No antecedent viral illness. She has a hard time describing how often or for how long she will feel dizzy. Similarly, she has a hard time qualifying what she means when she says "dizzy." Per chart review, no clear history of stroke.       Exam        On Dr. Luther Parody examination, she has no nystagmus, ataxia, or focal findings and appears to be at neurologic baseline. She does have mild weakness in the bilateral lower extremities that appears to be chronic, with pain and greater weakness of the right lower extremity. Sensory examination was normal.     Data Reviewed        I personally reviewed the pertinent imaging including CTA head/neck. On my read, posterior circulation including vertebral arteries, basilar artery, and anterior inferior cerebellar arteries appear patent -- there is no apparent vascular occlusion to otherwise explain her presentation. Imaging was notable for incidental 4 mm saccular aneurysm at the R MCA trifurcation.         Lab results: 08/29/23  0603   WBC 6.0   Hemoglobin 16.4*   Hematocrit 52*   RBC  6.0*   Platelets 206          Lab results: 08/29/23  0603   Sodium 140   Potassium 4.0   Chloride 98   CO2 26   UN 8   Creatinine 0.46*   Glucose 105*   Calcium 9.6   Total Protein 7.1   Albumin 3.7   ALT 19   AST 99*   Alk Phos 71   Bilirubin,Total 0.6        Impression and Recommendations  Kylie Parker is a 77 y.o. woman presenting with a fall in the setting of subacute to chronic "dizziness". Description of her dizziness as "lightheaded" is not entirely consistent with vertigo and may be more related to orthostasis or presyncope. Her report of more falls to the right may be attributable to her chronic lower extremity weakness and right lower extremity pain (of unclear chronicity). Examination was without evidence of focality to suggest brainstem pathology or inner ear pathology. Given difficulty obtaining clear history, and report of episodic symptoms for weeks to months with fall to the right, MRI (non-urgent, inpatient vs outpatient) to assess for chronic infarct or inner ear pathology to explain her symptoms is likely of low yield but reasonable to complete workup. Incidental aneurysm will need outpatient follow up.     Recommendations:  Outpatient referral to neurosurgery to discuss surveillance and management of incidental 4  mm aneurysm   Inpatient vs outpatient MRI brain to assess chronic dizziness given possible description of vertigo   Orthostatic vital signs to see if dizziness is reproducible and/or blood pressure related     I advised the provider to call back our service if the patient's neurological status changes or any new questions arise.   The provider understood and agreed with this impression and these recommendations.    Author: Lynnell Chad, MD  as of: 08/29/2023  at: 11:39 AM  Neurology Attending  [place] [facility]

## 2023-08-29 NOTE — ED Provider Notes (Signed)
History     Chief Complaint   Patient presents with    Kylie Parker is a 77 y.o. female with a past medical history of ADHD, bipolar 1 disorder, COPD (not on home oxygen), hypertension and fibromyalgia who presented to the emergency department after a mechanical fall versus syncope with prolonged downtime.  Patient reports she will intermittently have episodes of severe dizziness causing her to "fall to her right side."  This occurred at some point yesterday when it was still light outside (over 12 hours ago) and she felt her head hit the wall and cannot recall any additional events thereafter.  EMS found the patient laying facedown on the floor.  Patient reports she has "pain all over" but on further questioning/examination notes discomfort at her right hip and femur.  She is unable to tell me if she had any preceding chest pain or shortness of breath.  Overall, she is a poor historian regarding the events preceding her arrival.  Notably, she was evaluated in the emergency department approximately 2 weeks ago for a mechanical fall of similar nature.      History provided by:  Patient, EMS personnel and medical records  Language interpreter used: No      Medical/Surgical/Family History     Past Medical History:   Diagnosis Date    ADHD (attention deficit hyperactivity disorder)     Angina     Arthritis     Bipolar 1 disorder     Chest pain, unspecified     pressure    COPD (chronic obstructive pulmonary disease)     Fibromyalgia     Hypertension     Shortness of breath         Patient Active Problem List   Diagnosis Code    Numbness in both hands R20.0    Carpal tunnel syndrome on both sides G56.03    Cervical radiculopathy at C6 M54.12            Past Surgical History:   Procedure Laterality Date    COLON SURGERY      abcess    HAND SURGERY Bilateral     trigger finger bilateral    TONSILLECTOMY            Social History     Tobacco Use    Smoking status: Every Day     Packs/day: 1.00     Years: 50.00      Additional pack years: 0.00     Total pack years: 50.00     Types: Cigarettes    Smokeless tobacco: Never   Substance Use Topics    Alcohol use: No    Drug use: No             Review of Systems   All other systems reviewed and are negative.    Physical Exam     Triage Vitals  Triage Start: Start, (08/29/23 0535)  First Recorded BP: (!) 174/94, Resp: 18, Temp: 36.1 C (97 F), Temp src: TEMPORAL Oxygen Therapy SpO2: 97 %, O2 Device: None (Room air), Heart Rate: 98, (08/29/23 0538)  .    Physical Exam  Vitals and nursing note reviewed.   Constitutional:       Appearance: She is ill-appearing (Chronically).   HENT:      Head: Normocephalic and atraumatic.      Right Ear: External ear normal.      Left Ear: External ear normal.  Nose: Nose normal.      Mouth/Throat:      Mouth: Mucous membranes are dry.   Eyes:      Extraocular Movements: Extraocular movements intact.      Conjunctiva/sclera: Conjunctivae normal.   Cardiovascular:      Rate and Rhythm: Normal rate.   Pulmonary:      Effort: Pulmonary effort is normal.   Abdominal:      General: Abdomen is flat.      Palpations: Abdomen is soft.      Tenderness: There is no abdominal tenderness. There is no guarding or rebound.   Musculoskeletal:         General: Normal range of motion.      Cervical back: Normal range of motion and neck supple.      Comments: Tenderness to palpation throughout the right hip and femur with associated bruising  Range of motion intact throughout the extremities x 4, pain with range of motion in the RLE   Skin:     General: Skin is warm.      Capillary Refill: Capillary refill takes less than 2 seconds.   Neurological:      Mental Status: She is alert.      Comments: Mental Status: Alert and oriented to person, but not place or time  Cranial Nerves:   II: PERRL  III/IV/VI: EOM intact. No nystagmus. Eyelids open equal.  V: Facial sensation symmetric to light touch   VII: No Facial droop   VIII: Hearing intact to voice   IX/X: Normal  swallowing, normal voice  XI: Equal shoulder shrug  XII: Tongue midline  Strength intact x 4, globally diminished  Sensation intact x 4       Medical Decision Making   Patient seen by me on:  08/29/2023    Assessment:  Kylie Parker is a 77 y.o. female with a past medical history of ADHD, bipolar 1 disorder, COPD (not on home oxygen), hypertension and fibromyalgia who presented to the emergency department after a mechanical fall versus syncope with prolonged downtime of greater than 12 hours.  Overall a poor historian, but does note preceding dizziness with "leaning to the right while walking" which she has frequently.  Examination with tenderness palpation throughout the right hip and femur with associated bruising.  She has no obvious focal deficits consistent with stroke, however her intermittent episodes of dizziness warrant CT angios of the head/neck/carotids to evaluate for vertebral artery dissection/occlusion.  Will obtain blood work including a CK to evaluate for rhabdomyolysis and evaluate for underlying infectious source that may be provoking these episodes.  Ultimately, the patient has had several falls recently and appears to be unsafe in her current living arrangements and will likely require admission for placement in higher level of care.    Differential diagnosis:  Mechanical fall  Syncope  ACS  Vertebral artery dissection  Rhabdomyolysis  Urinary tract infection  Pneumonia  Dehydration  Electrolyte derangement    Plan:  CBC, CMP, phosphorus, magnesium, CK, troponins, urinalysis  CT head, CT angio head/neck/carotids, chest x-ray, right hip and femur x-ray    ED Course and Disposition:  Noted to have waxing/waning mental status shortly after arrival where she was unable to answer any orientation questions, returned back to her baseline within roughly 10 minutes.     On my review of blood work, CBC and CMP are grossly unremarkable.  Slight hypophosphatemia at 2.1 and borderline low magnesium at 1.7.   CK elevated at  2844, ordered for 1 L normal saline.  0-hour troponin elevated at 42.  On my review of imaging, no acute fractures in the right hip or femur.  CT head with no acute intracranial bleeding.  Signed out to the morning team pending repeat troponin, official radiology reports on imaging and disposition.           Joylene Draft, MD          Author:  Joylene Draft, MD         Joylene Draft, MD  08/29/23 [phone]

## 2023-08-29 NOTE — ED Notes (Signed)
Report called to [facility]

## 2023-08-29 NOTE — ED Triage Notes (Signed)
Patient BIBA from home after falling. Patient reports she gets dizzy and starts to go off to the right. Patient states she does not remember how she fell but it was light out and when she got up she was able to call for help. Patient reports having R hip pain, bilateral shoulder pain, and buttocks. Patient was found on her abdomen when EMS arrived. Patient states she fell last week and was here as well. Patient states "she hurts all over honestly." Patient is able to answer questions appropriately. Patient reports she is suppose to be getting a cane but she has not received a call yet.

## 2023-08-29 NOTE — Plan of Care (Signed)
Problem: Safety  Goal: Patient will remain free of falls  Outcome: Progressing towards goal  Goal: Prevent any intentional injury  Outcome: Progressing towards goal     Problem: Pain/Comfort  Goal: Patient's pain or discomfort is manageable  Outcome: Progressing towards goal     Problem: Nutrition  Goal: Nutritional status is maintained or improved - Geriatric  Outcome: Progressing towards goal     Problem: Mobility  Goal: Functional status is maintained or improved - Geriatric  Outcome: Progressing towards goal     Problem: Psychosocial  Goal: Demonstrates ability to cope with illness  Outcome: Progressing towards goal     Problem: Cognitive function  Goal: Cognitive function will be maintained or return to baseline  Description: Interventions:  Delirium Assessment  LIVEBAR Assessment    Outcome: Progressing towards goal  Goal: Lines and tethers will be removed as appropriate  Outcome: Progressing towards goal  Goal: Patient maintains appropriate nutritional intake  Outcome: Progressing towards goal  Goal: Vital signs will be within normal limits  Outcome: Progressing towards goal  Goal: Evidence for potential causes of delirium will be managed  Outcome: Progressing towards goal  Goal: Behaviors will return to baseline  Outcome: Progressing towards goal  Goal: Ambulation and mobility will be maintained  Outcome: Progressing towards goal  Goal: Retention of urine and constipation will be managed  Outcome: Progressing towards goal     Problem: Potential Alteration in Skin Integrity - Pressure Ulcer  Goal: The patient will maintain intact skin free of pressure ulcer  Outcome: Progressing towards goal     Problem: Risk for Impaired Sleep/Wake Cycle  Goal: The patient will maintain an adequate sleep/wake cycle  Outcome: Progressing towards goal     Problem: Bowel Elimination  Goal: Elimination pattern is normal or improving  Outcome: Progressing towards goal     Problem: Bladder Elimination  Goal: Patient is able to  empty bladder or return to baseline  Outcome: Progressing towards goal

## 2023-08-30 ENCOUNTER — Observation Stay: Payer: Medicare (Managed Care)

## 2023-08-30 DIAGNOSIS — T796XXA Traumatic ischemia of muscle, initial encounter: Secondary | ICD-10-CM

## 2023-08-30 DIAGNOSIS — F319 Bipolar disorder, unspecified: Secondary | ICD-10-CM

## 2023-08-30 LAB — CBC AND DIFFERENTIAL
Baso # K/uL: 0 10*3/uL (ref 0.0–0.2)
Eos # K/uL: 0 10*3/uL (ref 0.0–0.5)
Hematocrit: 50 % — ABNORMAL HIGH (ref 34–49)
Hemoglobin: 15.7 g/dL (ref 11.2–16.0)
IMM Granulocytes #: 0 10*3/uL (ref 0.0–0.0)
IMM Granulocytes: 0.4 %
Lymph # K/uL: 1.1 10*3/uL (ref 1.0–5.0)
MCV: 86 fL (ref 75–100)
Mono # K/uL: 0.4 10*3/uL (ref 0.1–1.0)
Neut # K/uL: 3.6 10*3/uL (ref 1.5–6.5)
Nucl RBC # K/uL: 0 10*3/uL (ref 0.0–0.0)
Nucl RBC %: 0 /100{WBCs} (ref 0.0–0.2)
Platelets: 200 10*3/uL (ref 150–450)
RBC: 5.8 MIL/uL — ABNORMAL HIGH (ref 4.0–5.5)
RDW: 18.2 % — ABNORMAL HIGH (ref 0.0–15.0)
Seg Neut %: 68.6 %
WBC: 5.2 10*3/uL (ref 3.5–11.0)

## 2023-08-30 LAB — BASIC METABOLIC PANEL
Anion Gap: 15 (ref 7–16)
CO2: 24 mmol/L (ref 20–28)
Calcium: 8.8 mg/dL (ref 8.6–10.2)
Chloride: 102 mmol/L (ref 96–108)
Creatinine: 0.45 mg/dL — ABNORMAL LOW (ref 0.51–0.95)
Glucose: 72 mg/dL (ref 60–99)
Lab: 5 mg/dL — ABNORMAL LOW (ref 6–20)
Potassium: 3.3 mmol/L (ref 3.3–4.6)
Sodium: 141 mmol/L (ref 133–145)
eGFR BY CREAT: 99 *

## 2023-08-30 LAB — TSH: TSH: 1.82 u[IU]/mL (ref 0.27–4.20)

## 2023-08-30 LAB — CK: CK: 1154 U/L — ABNORMAL HIGH (ref 26–192)

## 2023-08-30 LAB — MAGNESIUM: Magnesium: 1.6 mg/dL (ref 1.6–2.5)

## 2023-08-30 MED ORDER — LABETALOL HCL 20 MG/4 ML IV SOLN WRAPPED *I*
10.0000 mg | Freq: Four times a day (QID) | INTRAVENOUS | Status: DC | PRN
Start: 2023-08-30 — End: 2023-09-01
  Administered 2023-08-30 – 2023-08-31 (×2): 10 mg via INTRAVENOUS
  Filled 2023-08-30 (×2): qty 20

## 2023-08-30 NOTE — Plan of Care (Signed)
A&Ox3, able to make needs known. Requested pain medication this am for low back pain from laying in bed. No concerns at this time, ambulated to BR without issue, call bell within reach, will continue frequent rounds

## 2023-08-30 NOTE — Progress Notes (Signed)
Patient is on cardiac monitor,NS continues at 190ml/hr. Patient sleeping comfortably at this time. Patient requested and received pain meds for back pain. Relief obtained. Frequent rounding will continue, call bell is within reach.

## 2023-08-30 NOTE — Continuity of Care (Signed)
Received pt laying in bed. Pt is alert and Oriented x3. Pt states that she has no pain at this time. Pt states that her needs and wants have been met at this time. Safety measures in place. Call bell and bedside table within reach.

## 2023-08-30 NOTE — Comprehensive Assessment (Signed)
Case Management in to assess patient for discharge planning. Patient is awake in bed. She lives alone in a two-story home, but does not use the second floor. She is independent with all Activities of Daily Living and walks without a device. She has an old rolling walker to use if needed. She does not drive. She uses Medicaid travel or [employer] for transportation. She states that [employer] volunteer takes her shopping when needed. She does not have any family. Never had any children. She has a friend, who is elderly and does not drive. She has a friend Boston Service who is Case Manager with the [facility] and she provided me with his cell phone number. Call made to [facility] and call went to voice mail. Message left to return my call. She is a daily cigarette smoker and smokes approximately 1/2 pack per day.    She was evaluated by Physical and Occupational therapy today and they are recommending home therapy. Will speak to patient about home therapy after I speak to Boston Service, CM at [facility] to make sure that the patient home is adequate and she has a safe discharge home and she is able to care for herself at home.     08/30/23 0920   Demographics   Religious Beliefs None   County [place] Residence [place]   Marital status Divorced   Ethnicity/Race Caucasian   Primary Language English   Insurance Information Medicaid;Other (comment)  Kylie Parker)   Primary Care Taker of? No one   Risk Factors   Risk Factors Adjustment to Dx/Injury/Illness;Functional impairment;Questionable ability for self care or safety   Health Care Directives Screen (Also required for Hospice Patients)   *Has patient (or family) completed any of the following? (select all that apply) None completed   *Would they like to discuss any issues related to MOLST, DNR Order, HCP, Living Will, or POA? No   *Health Care Directive teaching done No   Living Situation   Lives With Alone   Home Geography    Type of Home 2 Story home   # Of Steps In Home 0  (does not use the second floor)   # Steps to Enter Home 4  (with railing)   Bedroom First floor   Bathroom First floor - full  (walkin with grab bar and shower chair)   Utilitites Working Yes   Alcohol Assessment (SBIRT)   > 0 ETOH drinks in past month N   SW Substance Abuse (not incl alcohol)   Other Substance Abuse Yes   SW Nicotine use   Nicotine frequency of use daily   Nicotine amount 1/2 PPD   Baseline ADL functioning   Transfers Independent   Ambulation Independent   Assistive Device none  (has a rolling walker to use if needed)   Bathing/Grooming Independent   Meal Prep Independent   Able to feed self? Yes   Household maintenance/chores With assistance  (has Investment banker, operational)   Able to drive? No  (uses [employer] and Medicaid travel)   Dialysis   Does Patient have Dialysis? 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  No   Is the patient presumed eligible for OPWDD services? No   Home Care Services   Do you  currently have home care services? No   Current Home Equipment Walker (rolling);Shower chair;Reacher   SW Home oxygen   Do you have home oxygen? No   SW/CM Response to Interventions   Patient accepts interventions for Physical Activity   Patient declines interventions for Food;Housing;Transportation   Time Spent   Time Spent with Patient (min) 45      Social Determinants of Health        Food Insecurity: No Food Insecurity (08/30/2023)    Hunger Vital Sign     Worried About Running Out of Food in the Last Year: Never true     Ran Out of Food in the Last Year: Never true    Date of last entry: 08/30/2023    Housing Stability: Low Risk  (08/30/2023)    Housing Stability Vital Sign     Unable to Pay for Housing in the Last Year: No     Number of Times Moved in the Last Year: 0      Homeless in the Last Year: No    Date of last entry: 08/30/2023   Utilities: Not At Risk (08/29/2023)    AHC Utilities     Threatened with loss of utilities: No    Date of last entry: 08/29/2023   Transportation Needs: No Transportation Needs (08/30/2023)    PRAPARE - Transportation     Lack of Transportation (Medical): No     Lack of Transportation (Non-Medical): No    Date of last entry: 08/30/2023            08/30/2023     9:20 AM   Interventions Accepted   Patient accepts interventions for Physical Activity         08/30/2023     9:20 AM   Interventions Declined   Patient declines interventions for [employer]

## 2023-08-30 NOTE — Progress Notes (Signed)
AM medications given and taken without difficulty. Pt is able to make needs and wants known. IV site dressing changed. Call light in reach

## 2023-08-30 NOTE — Progress Notes (Signed)
08/30/23 1138   UM Patient Class Review   Patient Class Review Observation   Level of Care Review  Acute     Patient Class effective 08/29/23

## 2023-08-30 NOTE — Progress Notes (Signed)
Occupational Therapy Evaluation    Patient: Kylie Parker, 03-02-1946  Does OT need to see patient PRIOR to D/C: No  Discharge recommendation:   Prior Living Environment, Home OT   Equipment recommendations:  (TBD)   Hospital Stay Recommendations for nursing: CGA for mobility and ADLs in bathroom    Initial Eval Completed. Kylie Parker is a 77 y.o.female who presents with Rhabdomyolysis. Patient was able to complete bed mobility with minimal assist of 1. They completed sit<>stand transfer with contact guard assist. Patient completed functional mobility with contact guard assist. They participated in LB dressing with supervision and no assistive device. Patient will benefit from skilled occupational therapy services during inpatient stay to maintain and promote functional independence. Therapist will recommend prior living environment and home OT as patient demonstrates adequate ADLs for discharge.      Past Medical History:   Diagnosis Date    ADHD (attention deficit hyperactivity disorder)     Angina     Arthritis     Bipolar 1 disorder     Chest pain, unspecified     pressure    COPD (chronic obstructive pulmonary disease)     Fibromyalgia     Hypertension     Shortness of breath        Past Surgical History:   Procedure Laterality Date    COLON SURGERY      abcess    HAND SURGERY Bilateral     trigger finger bilateral    TONSILLECTOMY         *Bold Indicates co-morbidities affecting treatment and recovery    Occupational Profile relating to the present problem:  Lives alone  Advanced age    In addition to the PMH and surgical history listed above, the comorbidities affecting treatment/ recovery:   none noted    Performance deficits that result in activity limitations and/or performance restrictions:  decreased ADLs, decreased transfers, and decreased balance    Modification of tasks needed or assistance with assessments necessary to enable completion of evaluation:  ADLs: LB dressing with supervision and no  assistive device  Transfers: bed mobility with minimal assist of 1  Functional Mobility: contact guard assist    Patient complexity:  moderate level as indicated by above personal factors, environmental factors and comorbidities in addition to their impairments found on physical exam.       OT Assessment (HH / URR) - 08/30/23 0804          OT Tracking  (HH / URR)    OT Tracking (HH / URR) OT Assigned     Type of Session evaluation with treatment     SW Request? No        OT Last Visit    Visit (#)  1        Precautions    Precautions used Yes     Fall Precautions General falls precautions;Staff present     LDA Observation IV lines;Monitors     Writer wearing PPE including Gloves;Mask     Activity Order Activity as tolerated        Home Living (Prior to Admission)    Prior Living Situation Reported by patient     Type of Home Two Story Home     # Steps to Enter Home 4     # Of Steps In Home 0   does not use second floor    Location of Bedroom First Floor Set Up     Location of Bathroom  First Floor Set Up     Rails to Enter Yes     Bathroom Shower/Tub Walk-in shower     Current Home Garment/textile technologist Accessible     Additional Comments patient needs assistance with negotiating stairs into home, reports that she gets help into the home from the guy that helps her get groceries        Prior Function    Prior Function Reported by patient     Level of Independence Independent with ADLs;Independent with ADL functional transfers;Independent ambulation;Independent with homemaking with ambulation     Lives With Alone     Receives Help From Friend(s)     IADL Needs assistance     Additional Comments has assistance with groceries        Current Pain Assessment    Pain Assessment / Reassessment Assessment     Pain Scale 0-10 (Numeric Scale for Pain Intensity)      0-10 Scale 9        Cognition    Cognition No deficit noted        UE Assessment    Additional Comments WFL during session        Bed  Mobility    Supine to Sit Minimal Assist     Sit to Supine Not tested     Additional Comments patient greeted supine in bed, left up in chair for breakfast        Functional Transfers    Sit to Stand Contact guard     Stand to Sit Contact guard     Functional Mobility Contact Guard        Balance    Sitting - Static Independent      Sitting - Dynamic Independent     Standing - Static Contact Guard     Standing - Dynamic Contact Guard     Standing Tolerance during Functional Task fair        ADL Assessment    Eating Independent     LE Dressing Supervision        Assist Needed With Socks        Where LE Dressing Assessed Edge of bed        Activity Tolerance    Endurance Tolerates 10 - 20 min activity with multiple rests        Functional Outcomes Measures    Functional Outcome Measures Yes        OT AM-PAC Self Care    Putting on and taking off regular lower body clothing? 3     Bathing (including washing, rinsing, drying)? 3     Toileting, which includes using toilet, bedpan, or urinal? 3     Putting on and taking off regular upper body clothing? 4     Taking care of personal grooming such as brushing teeth? 4     Eating Meals 4     Total Raw Score 21     CMS Score - Calculated 32.79%        Assessment    Assessment Impaired ADL status;Impaired Safe judgement during ADL;Impaired endurance;Impaired self-care transfers        Plan    OT Frequency 1-2x/wk     Patient Will Benefit From ADL retraining;Functional transfer training;UE strengthening/ROM;Endurance training;Exercises;Equipment eval/education        Recommendation    OT Discharge Recommendations Prior Living Environment;Home OT     OT Discharge Equipment Recommended --   TBD  OT Hospital Stay Recommendations CGA for mobility and ADLs in bathroom     OT needs to see patient prior to DC  No        Multidisciplinary Communication    Multidisciplinary Communication PT        Patient/Family/Caregiver Training    Patient/Family/Caregiver training Yes      Patient/Family/Caregiver training Role of occupational therapy in hospital and plan for evaluation;Discharge planning;OT plan of care after evaluation;Benefit of OOB activity and participation in ADLs while inpatient;Call don't fall, purpose of bed/chair alarm, and recommendations for nursing        Time Calculation    OT Timed Codes 0     OT Untimed Codes 10     OT Unbilled Time 0     OT Total Treatment 10        Plan and Onset date    Plan of Care Date 08/30/23     Onset Date 08/29/23     Treatment Start Date 08/30/23                     Patient Goals:  Goal Goal Status   TOILETING - Patient will complete toileting with no device and contact guard assist on standard toilet - Time Frame: 1-3 Visits New   Patient will ambulate to toilet with supervision and use of no mobility device - Time Frame: 1-3 Visits New       AMPAC Score Interpretation:  Adapted from Roxanne Gates, et al Association of AM-PAC "6 Clicks" Basic Mobility and Daily Activity Scores with discharge destination, 2021  AM-PAC Raw Score of > 19 (standardized score > 40.22 %) indicates a high likelihood of discharge to home     Adapted from [employer] al "Validity of h AM-PAC "6- Clicks" Inpatient Daily Activity and Basic Mobility Short Forms (2014)   AM-PAC raw score  >= 18 (standardized score >= 39, % impairment  < 47%) indicates a high probability of a discharge to home  AM-PAC raw score  >= 13 (standardized score >= 32.03, % impairment 63%) indicates pt is more likely to require IRF/SNF R  AM-PAC raw score <12 (standardized score < 30.6 % impairment > 66%) indicates pt is more likely to require Long Term Care      Thank you for your referral.   Charlean Merl, OT

## 2023-08-30 NOTE — Progress Notes (Signed)
Physical Therapy Evaluation      Discharge recommendation:  Dual plan (home vs. rehab)  Equipment recommendations upon discharge: To be determined  Mobility recommendations for nursing while in hospital: CGAx1    Patient Name: Kylie Parker  Date of Birth: May 07, 1946  MRN#: [id number]  Admission Date: 08/29/2023  Date of Service: 08/30/2023    History of present illness:   Kylie Parker is a 77 y.o. female who presented to [name] with Rhabdomyolysis. Patient was referred to Physical Therapy for functional mobility assessment and discharge planning.    Prior physical therapy history for above complaint within the last year: NA    Admitting Diagnosis:   Rhabdomyolysis       Past Medical History:   Diagnosis Date    ADHD (attention deficit hyperactivity disorder)     Angina     Arthritis     Bipolar 1 disorder     Chest pain, unspecified     pressure    COPD (chronic obstructive pulmonary disease)     Fibromyalgia     Hypertension     Shortness of breath      Past Surgical History:   Procedure Laterality Date    COLON SURGERY      abcess    HAND SURGERY Bilateral     trigger finger bilateral    TONSILLECTOMY         Hospital Course:  Pelvis standard AP view    Result Date: 08/29/2023  No acute fracture, dislocation, or diastasis. END OF IMPRESSION I have personally reviewed the images and the Resident's/Fellow's interpretation and agree with or edited the findings.       [facility] submits this DICOM format image data and final report to the [facility], an independent secure electronic health information exchange, on a reciprocally searchable basis (with patient authorization) for a minimum of 12 months after exam date.    * Femur RIGHT standard AP and Lateral    Result Date: 08/29/2023  Osteopenia. No acute fracture is seen. END OF IMPRESSION I have personally reviewed the images and the Resident's/Fellow's interpretation and agree with or edited the findings.       [facility] submits this DICOM format image data  and final report to the [facility], an independent secure electronic health information exchange, on a reciprocally searchable basis (with patient authorization) for a minimum of 12 months after exam date.    CT angio neck carotid for stroke    Result Date: 08/29/2023  4 mm saccular aneurysm projecting inferolaterally near the MCA trifurcation. Patent anterior and posterior circulations of the brain, without large vessel occlusion. Patent carotid and vertebral arteries of the neck, without stenosis or dissection. A presumed web is present at the right carotid bifurcation. These findings were communicated to Dr. Bonney Leitz by clerical staff at approximately 9:54 a.m. END OF IMPRESSION I have personally reviewed the images and the Resident's/Fellow's interpretation and agree with or edited the findings.       [facility] submits this DICOM format image data and final report to the [facility], an independent secure electronic health information exchange, on a reciprocally searchable basis (with patient authorization) for a minimum of 12 months after exam date.    CT angio head for stroke    Result Date: 08/29/2023  4 mm saccular aneurysm projecting inferolaterally near the MCA trifurcation. Patent anterior and posterior circulations of the brain, without large vessel occlusion. Patent carotid and vertebral arteries of the  neck, without stenosis or dissection. A presumed web is present at the right carotid bifurcation. These findings were communicated to Dr. Bonney Leitz by clerical staff at approximately 9:54 a.m. END OF IMPRESSION I have personally reviewed the images and the Resident's/Fellow's interpretation and agree with or edited the findings.       [facility] submits this DICOM format image data and final report to the [facility], an independent secure electronic health information exchange, on a reciprocally searchable basis (with patient authorization) for a minimum of 12 months after exam  date.    EKG 12 lead (initial)    Result Date: 08/29/2023  Sinus rhythm Left atrial enlargement    *Chest STANDARD single view    Result Date: 08/29/2023  No acute cardiopulmonary disease. END OF IMPRESSION       [facility] submits this DICOM format image data and final report to the [facility], an independent secure electronic health information exchange, on a reciprocally searchable basis (with patient authorization) for a minimum of 12 months after exam date.    CT head without contrast for stroke    Result Date: 08/29/2023  No acute/subacute transcortical infarct. No acute intracranial hemorrhage. Mild generalized brain volume loss. Mild chronic microvascular white matter changes. END OF IMPRESSION I have personally reviewed the images and the Resident's/Fellow's interpretation and agree with or edited the findings.       [facility] submits this DICOM format image data and final report to the [facility], an independent secure electronic health information exchange, on a reciprocally searchable basis (with patient authorization) for a minimum of 12 months after exam date.     SUBJECTIVE  Patient reports that she requires assistance to do the steps outside of her home and some ADL's    OBJECTIVE/SOCIAL HISTORY    Height: 144.8 cm (4\' 9" )  Weight: 60.3 kg (133 lb)     PT Adult Assessment - 08/30/23 0804          Prior Living     Prior Living Situation Reported by patient     Lives With Alone     Type of Home Two Story Home     # Steps to Enter Home 4     Location of Bedrooms 1st floor     Location of Bathrooms 1st floor     Bathroom Accessibility Accessible        Prior Function Level    Prior Function Level Reported by patient     Transfers Independent     Transfer Devices None     Walking Independent     Walking assistive devices used None     Stair negotiation Required assistance        PT Tracking    PT TRACKING PT Assigned     Type of Session evaluation        Precautions/Observations    Precautions  used Yes     Was patient wearing a mask? No     PPE worn by Clinical research associate [facility]     Fall Precautions General falls precautions        Current Pain Assessment    No Intervention/MAR Intervention(s) No Intervention required        Vision     Current Vision Adequate for PT session        Cognition    Cognition Tested     Arousal/Alertness Appropriate responses to stimuli     Orientation Alert and oriented x3  Ability to Follow Instructions Follows all commands and directions without difficulty        UE Assessment    Assessment Focus Strength        LUE Strength    Overall Strength WFL assessed within functional activities;Deficits        RUE Strength    Overall Strength WFL assessed within functional activities;Deficits        LE Assessment    Assessment Focus Strength        LLE Strength    Overall Strength Deficits        RLE Strength    Overall Strength Deficits        Sensation    Sensation No apparent deficit        Bed Mobility    Bed mobility Tested     Supine to Sit Stand by assistance     Sit to Supine Stand by assistance        Transfers    Transfers Tested     Sit to Stand Stand by assistance     Stand to sit Stand by assistance     Transfer Assistive Device none        Mobility    Mobility Tested     Gait Pattern Decreased cadence;Decreased R step height;Decreased L step height     Ambulation Assist Stand by     Ambulation Distance (Feet) 10     Ambulation Assistive Device None        Family/Caregiver Training`    Patient/Family/Caregiver training Yes     Patient training Role of physical therapy in hospital and plan for evaluation and follow up;Discharge planning;PT plan of care after evaluation        Balance    Balance Tested     Sitting - Static Independent     Sitting - Dynamic Independent     Standing - Static Supervision     Standing - Dynamic Min assist        Functional Outcome Measures    Functional Outcome Measures Yes        PT AM-PAC Mobility    Turning over in bed? None: Modified  Independence/independent     Moving from lying on back to sitting on the side of the bed? None: Modified Independence/independent     Moving to and from a bed to a chair? A Little: Minimum/Contact Guard Assist/Supervision     Sitting down on and standing up from a chair with arms? A Little: Minimum/Contact Guard Assist/Supervision     Need to walk in hospital room? A Little: Minimum/Contact Guard Assist/Supervision     Climbing 3 - 5 steps with a railing? A Little: Minimum/Contact Guard Assist/Supervision     Total Raw Score 20     Standardized Score - Calculated 47.67     % Functional Impairment - Calculated 36%        Assessment    Brief Assessment Appropriate for skilled therapy     Problem List Impaired LE strength;Impaired endurance;Impaired ambulation;Impaired functional status;Impaired functional mobility     Patient / Family Goal to feel better     Overall Assessment Patient is below her baseline level of function and requires assistance in the home for ADL's. At this time we will dual plan her as she has no family and may benefit from assisted living.        Plan/Recommendation    PT Treatment Interventions Restorative PT;Assess functional mobility;D/C planning     PT  Frequency 2-4x/wk     PT Mobility Recommendations CGAx1     PT Referral Recommendations OT;Home care     PT Discharge Recommendations Dual plan (home vs. rehab)     PT Discharge Equipment Recommended To be determined     PT Assessment/Recommendations Reviewed With: Nursing;Occupational Therapy;Patient;Physician;Care coordinator        Time Calculation    PT Timed Codes 0     PT Untimed Codes 1     PT Unbilled Time 0     PT Total Treatment 1        Plan and Onset date    Plan of Care Date 08/30/23     Onset Date 08/29/23     Treatment Start Date 08/30/23                       ASSESSMENT:  Kylie Parker is a 77 y.o. female who has been admitted to the hospital with Rhabdomyolysis  .  She has been referred to PT for Patient/Family/Caregiver  Education and Training and Discharge Planning Recommendations. Patient presents to PT currently requiring CGAx1.  Patient will benefit from skilled PT services to address above stated impairments with above stated interventions.     Personal factors affecting treatment/recovery:  Lives alone  History of falls  Needs assistance for mobility    Comorbidities affecting treatment/recovery for this admission (*see bolded below):  Past Medical History:   Diagnosis Date    ADHD (attention deficit hyperactivity disorder)     Angina     Arthritis     Bipolar 1 disorder     Chest pain, unspecified     pressure    COPD (chronic obstructive pulmonary disease)     Fibromyalgia     Hypertension     Shortness of breath      Clinical presentation:  evolving    Patient complexity:    moderate level as indicated by above stability of condition, personal factors, environmental factors and comorbidities in addition to their impairments found on physical exam.    PT Prognosis:good    Patient / Caregiver Understanding: good    Long term goals: Patient will be independent with ADL's and mobility/stairs in four weeks.     PT Care Plan Notes       No notes found.              PLAN   (see flow sheet above for components of plan)    Plan of Care     The physician's co-signature on this note indicates that they have reviewed this evaluation and agree with the documented goals and plan of care.    Thank you for the referral.    Herbert Deaner, PT,DPT

## 2023-08-30 NOTE — Progress Notes (Signed)
General Daily SOAP Progress Note for Inpatients   LOS: 0 days       Subjective  The patient was seen on bed. No CP, SOB. No abdominal complaints. No fever.     Vitals: Blood pressure 161/88, pulse 87, temperature 36.5 C (97.7 F), temperature source Temporal, resp. rate 16, height 1.448 m (4\' 9" ), weight 60.3 kg (133 lb), SpO2 94%.   Vital-Signs Ranges: Temp:  [35.9 C (96.7 F)-36.5 C (97.7 F)] 36.5 C (97.7 F)  Heart Rate:  [66-112] 87  Resp:  [16-20] 16  BP: (112-161)/(54-89) 161/88    Objective:  General Appearance:  Comfortable.    Vital signs: (most recent): Blood pressure 161/88, pulse 87, temperature 36.5 C (97.7 F), temperature source Temporal, resp. rate 16, height 1.448 m (4\' 9" ), weight 60.3 kg (133 lb), SpO2 94%.    Lungs:  Normal effort and normal respiratory rate.  Breath sounds clear to auscultation.    Heart: Normal rate.  Regular rhythm.  S1 normal and S2 normal.    Abdomen: Abdomen is soft.  There is no abdominal tenderness.     Neurological: Patient is alert.          Lab Results: All labs in the last 24 hours:   Recent Results (from the past 24 hours)   Basic metabolic panel    Collection Time: 08/30/23  4:39 AM   Result Value Ref Range    Glucose 72 60 - 99 mg/dL    Sodium 161 096 - 045 mmol/L    Potassium 3.3 3.3 - 4.6 mmol/L    Chloride 102 96 - 108 mmol/L    CO2 24 20 - 28 mmol/L    Anion Gap 15 7 - 16    UN 5 (L) 6 - 20 mg/dL    Creatinine 4.09 (L) 0.51 - 0.95 mg/dL    eGFR BY CREAT 99 *    Calcium 8.8 8.6 - 10.2 mg/dL   Magnesium    Collection Time: 08/30/23  4:39 AM   Result Value Ref Range    Magnesium 1.6 1.6 - 2.5 mg/dL   TSH    Collection Time: 08/30/23  4:39 AM   Result Value Ref Range    TSH 1.82 0.27 - 4.20 uIU/mL   CBC and differential    Collection Time: 08/30/23  4:39 AM   Result Value Ref Range    WBC 5.2 3.5 - 11.0 THOU/uL    RBC 5.8 (H) 4.0 - 5.5 MIL/uL    Hemoglobin 15.7 11.2 - 16.0 g/dL    Hematocrit 50 (H) 34 - 49 %    MCV 86 75 - 100 fL    RDW 18.2 (H) 0.0 - 15.0 %     Platelets 200 150 - 450 THOU/uL    Seg Neut % 68.6 %    Neut # K/uL 3.6 1.5 - 6.5 THOU/uL    Lymph # K/uL 1.1 1.0 - 5.0 THOU/uL    Mono # K/uL 0.4 0.1 - 1.0 THOU/uL    Eos # K/uL 0.0 0.0 - 0.5 THOU/uL    Baso # K/uL 0.0 0.0 - 0.2 THOU/uL    Nucl RBC % 0.0 0.0 - 0.2 /100 WBC    Nucl RBC # K/uL 0.0 0.0 - 0.0 THOU/uL    IMM Granulocytes # 0.0 0.0 - 0.0 THOU/uL    IMM Granulocytes 0.4 %   CK    Collection Time: 08/30/23  4:39 AM   Result Value Ref Range  CK 1,154 (H) 26 - 192 U/L       Radiology impressions (last 3 days):  Pelvis standard AP view    Result Date: 08/29/2023  No acute fracture, dislocation, or diastasis. END OF IMPRESSION I have personally reviewed the images and the Resident's/Fellow's interpretation and agree with or edited the findings.       [facility] submits this DICOM format image data and final report to the [facility], an independent secure electronic health information exchange, on a reciprocally searchable basis (with patient authorization) for a minimum of 12 months after exam date.    * Femur RIGHT standard AP and Lateral    Result Date: 08/29/2023  Osteopenia. No acute fracture is seen. END OF IMPRESSION I have personally reviewed the images and the Resident's/Fellow's interpretation and agree with or edited the findings.       [facility] submits this DICOM format image data and final report to the [facility], an independent secure electronic health information exchange, on a reciprocally searchable basis (with patient authorization) for a minimum of 12 months after exam date.    CT angio neck carotid for stroke    Result Date: 08/29/2023  4 mm saccular aneurysm projecting inferolaterally near the MCA trifurcation. Patent anterior and posterior circulations of the brain, without large vessel occlusion. Patent carotid and vertebral arteries of the neck, without stenosis or dissection. A presumed web is present at the right carotid bifurcation. These findings were communicated to  Dr. Bonney Leitz by clerical staff at approximately 9:54 a.m. END OF IMPRESSION I have personally reviewed the images and the Resident's/Fellow's interpretation and agree with or edited the findings.       [facility] submits this DICOM format image data and final report to the [facility], an independent secure electronic health information exchange, on a reciprocally searchable basis (with patient authorization) for a minimum of 12 months after exam date.    CT angio head for stroke    Result Date: 08/29/2023  4 mm saccular aneurysm projecting inferolaterally near the MCA trifurcation. Patent anterior and posterior circulations of the brain, without large vessel occlusion. Patent carotid and vertebral arteries of the neck, without stenosis or dissection. A presumed web is present at the right carotid bifurcation. These findings were communicated to Dr. Bonney Leitz by clerical staff at approximately 9:54 a.m. END OF IMPRESSION I have personally reviewed the images and the Resident's/Fellow's interpretation and agree with or edited the findings.       [facility] submits this DICOM format image data and final report to the [facility], an independent secure electronic health information exchange, on a reciprocally searchable basis (with patient authorization) for a minimum of 12 months after exam date.    EKG 12 lead (initial)    Result Date: 08/29/2023  Sinus rhythm Left atrial enlargement    *Chest STANDARD single view    Result Date: 08/29/2023  No acute cardiopulmonary disease. END OF IMPRESSION       [facility] submits this DICOM format image data and final report to the [facility], an independent secure electronic health information exchange, on a reciprocally searchable basis (with patient authorization) for a minimum of 12 months after exam date.    CT head without contrast for stroke    Result Date: 08/29/2023  No acute/subacute transcortical infarct. No acute intracranial hemorrhage. Mild  generalized brain volume loss. Mild chronic microvascular white matter changes. END OF IMPRESSION I have personally reviewed the images and  the Resident's/Fellow's interpretation and agree with or edited the findings.       [facility] submits this DICOM format image data and final report to the [facility], an independent secure electronic health information exchange, on a reciprocally searchable basis (with patient authorization) for a minimum of 12 months after exam date.     Currently Active/Followed Hospital Problems:  Active Hospital Problems    Diagnosis     *!*Rhabdomyolysis     Fall     Fall, initial encounter     COPD (chronic obstructive pulmonary disease)     Hypertension     Coronary arteriosclerosis     Generalized anxiety disorder        Assessment and Plan Section:  Assessment & Plan  1. Falls/Rhabdomyolysis  -She thinks she might have taken the wrong medications causing her to accidentally OD.   -CT head did not show any acute events. CTA showed 4 mm aneurysm.   -Following MRI.   -IV fluids, follow CK.   2. HTN  -On amlodipine 2.5 mg daily, isosorbide 30 mg daily, metoprolol XL 25 mg daily.   3. Bipolar  -On pregabalin 100 mg TID, venlafaxine 37.5 mg daily.   4. DVT prophylaxis  -On enoxaparin SC.  DISPO:   -PT/OT.     Author: Mingo Amber, MD  as of: 08/30/2023  at: 2:22 PM

## 2023-08-30 NOTE — Progress Notes (Deleted)
Physical Therapy Evaluation      Discharge recommendation:  Home PT, Intermittent supervision/assist  Equipment recommendations upon discharge: To be determined  Mobility recommendations for nursing while in hospital: CGAx1    Patient Name: Kylie Parker  Date of Birth: 11-04-1945  MRN#: [id number]  Admission Date: 08/29/2023  Date of Service: 08/30/2023    History of present illness:   Kylie Parker is a 77 y.o. female who presented to [name] with Rhabdomyolysis. Patient was referred to Physical Therapy for functional mobility assessment and discharge planning.    Prior physical therapy history for above complaint within the last year: NA    Admitting Diagnosis:   Rhabdomyolysis       Past Medical History:   Diagnosis Date    ADHD (attention deficit hyperactivity disorder)     Angina     Arthritis     Bipolar 1 disorder     Chest pain, unspecified     pressure    COPD (chronic obstructive pulmonary disease)     Fibromyalgia     Hypertension     Shortness of breath      Past Surgical History:   Procedure Laterality Date    COLON SURGERY      abcess    HAND SURGERY Bilateral     trigger finger bilateral    TONSILLECTOMY         Hospital Course:  Pelvis standard AP view    Result Date: 08/29/2023  No acute fracture, dislocation, or diastasis. END OF IMPRESSION I have personally reviewed the images and the Resident's/Fellow's interpretation and agree with or edited the findings.       [facility] submits this DICOM format image data and final report to the [facility], an independent secure electronic health information exchange, on a reciprocally searchable basis (with patient authorization) for a minimum of 12 months after exam date.    * Femur RIGHT standard AP and Lateral    Result Date: 08/29/2023  Osteopenia. No acute fracture is seen. END OF IMPRESSION I have personally reviewed the images and the Resident's/Fellow's interpretation and agree with or edited the findings.       [facility] submits this DICOM  format image data and final report to the [facility], an independent secure electronic health information exchange, on a reciprocally searchable basis (with patient authorization) for a minimum of 12 months after exam date.    CT angio neck carotid for stroke    Result Date: 08/29/2023  4 mm saccular aneurysm projecting inferolaterally near the MCA trifurcation. Patent anterior and posterior circulations of the brain, without large vessel occlusion. Patent carotid and vertebral arteries of the neck, without stenosis or dissection. A presumed web is present at the right carotid bifurcation. These findings were communicated to Dr. Bonney Leitz by clerical staff at approximately 9:54 a.m. END OF IMPRESSION I have personally reviewed the images and the Resident's/Fellow's interpretation and agree with or edited the findings.       [facility] submits this DICOM format image data and final report to the [facility], an independent secure electronic health information exchange, on a reciprocally searchable basis (with patient authorization) for a minimum of 12 months after exam date.    CT angio head for stroke    Result Date: 08/29/2023  4 mm saccular aneurysm projecting inferolaterally near the MCA trifurcation. Patent anterior and posterior circulations of the brain, without large vessel occlusion. Patent carotid and vertebral arteries of the neck,  without stenosis or dissection. A presumed web is present at the right carotid bifurcation. These findings were communicated to Dr. Bonney Leitz by clerical staff at approximately 9:54 a.m. END OF IMPRESSION I have personally reviewed the images and the Resident's/Fellow's interpretation and agree with or edited the findings.       [facility] submits this DICOM format image data and final report to the [facility], an independent secure electronic health information exchange, on a reciprocally searchable basis (with patient authorization) for a minimum of 12  months after exam date.    EKG 12 lead (initial)    Result Date: 08/29/2023  Sinus rhythm Left atrial enlargement    *Chest STANDARD single view    Result Date: 08/29/2023  No acute cardiopulmonary disease. END OF IMPRESSION       [facility] submits this DICOM format image data and final report to the [facility], an independent secure electronic health information exchange, on a reciprocally searchable basis (with patient authorization) for a minimum of 12 months after exam date.    CT head without contrast for stroke    Result Date: 08/29/2023  No acute/subacute transcortical infarct. No acute intracranial hemorrhage. Mild generalized brain volume loss. Mild chronic microvascular white matter changes. END OF IMPRESSION I have personally reviewed the images and the Resident's/Fellow's interpretation and agree with or edited the findings.       [facility] submits this DICOM format image data and final report to the [facility], an independent secure electronic health information exchange, on a reciprocally searchable basis (with patient authorization) for a minimum of 12 months after exam date.     SUBJECTIVE  Patient reports she is doing okay today    OBJECTIVE/SOCIAL HISTORY    Height: 144.8 cm (4\' 9" )  Weight: 60.3 kg (133 lb)     PT Adult Assessment - 08/30/23 0804          Prior Living     Prior Living Situation Reported by patient     Lives With Alone     Type of Home Two Story Home     # Steps to Enter Home 4     Location of Bedrooms 1st floor     Location of Bathrooms 1st floor     Bathroom Accessibility Accessible        Prior Function Level    Prior Function Level Reported by patient     Transfers Independent     Transfer Devices None     Walking Independent     Walking assistive devices used None     Stair negotiation Required assistance        PT Tracking    PT TRACKING PT Assigned     Type of Session evaluation        Precautions/Observations    Precautions used Yes     Was patient wearing a mask?  No     PPE worn by Clinical research associate [facility]     Fall Precautions General falls precautions        Current Pain Assessment    No Intervention/MAR Intervention(s) No Intervention required        Vision     Current Vision Adequate for PT session        Cognition    Cognition Tested     Arousal/Alertness Appropriate responses to stimuli     Orientation Alert and oriented x3     Ability to Follow Instructions Follows all commands and directions without difficulty  UE Assessment    Assessment Focus Strength        LUE Strength    Overall Strength WFL assessed within functional activities;Deficits        RUE Strength    Overall Strength WFL assessed within functional activities;Deficits        LE Assessment    Assessment Focus Strength        LLE Strength    Overall Strength Deficits        RLE Strength    Overall Strength Deficits        Sensation    Sensation No apparent deficit        Bed Mobility    Bed mobility Tested     Supine to Sit Stand by assistance     Sit to Supine Stand by assistance        Transfers    Transfers Tested     Sit to Stand Stand by assistance     Stand to sit Stand by assistance     Transfer Assistive Device none        Mobility    Mobility Tested     Gait Pattern Decreased cadence;Decreased R step height;Decreased L step height     Ambulation Assist Stand by     Ambulation Distance (Feet) 10     Ambulation Assistive Device None        Family/Caregiver Training`    Patient/Family/Caregiver training Yes     Patient training Role of physical therapy in hospital and plan for evaluation and follow up;Discharge planning;PT plan of care after evaluation        Balance    Balance Tested     Sitting - Static Independent     Sitting - Dynamic Independent     Standing - Static Supervision     Standing - Dynamic Min assist        Functional Outcome Measures    Functional Outcome Measures Yes        PT AM-PAC Mobility    Turning over in bed? None: Modified Independence/independent     Moving from lying on  back to sitting on the side of the bed? None: Modified Independence/independent     Moving to and from a bed to a chair? A Little: Minimum/Contact Guard Assist/Supervision     Sitting down on and standing up from a chair with arms? A Little: Minimum/Contact Guard Assist/Supervision     Need to walk in hospital room? A Little: Minimum/Contact Guard Assist/Supervision     Climbing 3 - 5 steps with a railing? A Little: Minimum/Contact Guard Assist/Supervision     Total Raw Score 20     Standardized Score - Calculated 47.67     % Functional Impairment - Calculated 36%        Assessment    Brief Assessment Appropriate for skilled therapy     Problem List Impaired LE strength;Impaired endurance;Impaired ambulation;Impaired functional status;Impaired functional mobility     Patient / Family Goal to feel better     Overall Assessment Patient is below her baseline level of function however demonstrates adequate mobility to return home with intermittent supervision/assist and Home PT services        Plan/Recommendation    PT Treatment Interventions Restorative PT;Assess functional mobility;D/C planning     PT Frequency 2-4x/wk     PT Mobility Recommendations CGAx1     PT Referral Recommendations OT;Home care     PT Discharge Recommendations Home PT;Intermittent supervision/assist  PT Discharge Equipment Recommended To be determined     PT Assessment/Recommendations Reviewed With: Nursing;Occupational Therapy;Patient;Physician;Care coordinator        Time Calculation    PT Timed Codes 0     PT Untimed Codes 1     PT Unbilled Time 0     PT Total Treatment 1        Plan and Onset date    Plan of Care Date 08/30/23     Onset Date 08/29/23     Treatment Start Date 08/30/23                       ASSESSMENT:  Kylie Parker is a 77 y.o. female who has been admitted to the hospital with Rhabdomyolysis.  She has been referred to PT for Patient/Family/Caregiver Education and Training and Discharge Planning Recommendations. Patient  presents to PT currently requiring CGAx1.  Patient will benefit from skilled PT services to address above stated impairments with above stated interventions.     Personal factors affecting treatment/recovery:  Lives alone    Comorbidities affecting treatment/recovery for this admission (*see bolded below):  Past Medical History:   Diagnosis Date    ADHD (attention deficit hyperactivity disorder)     Angina     Arthritis     Bipolar 1 disorder     Chest pain, unspecified     pressure    COPD (chronic obstructive pulmonary disease)     Fibromyalgia     Hypertension     Shortness of breath      Clinical presentation:  evolving    Patient complexity:    moderate level as indicated by above stability of condition, personal factors, environmental factors and comorbidities in addition to their impairments found on physical exam.    PT Prognosis:good    Patient / Caregiver Understanding: good    Long term goals: Patient will be able to perform all transfers/mobility independently in the home in four weeks.     PT Care Plan Notes       No notes found.              PLAN   (see flow sheet above for components of plan)    Plan of Care     The physician's co-signature on this note indicates that they have reviewed this evaluation and agree with the documented goals and plan of care.    Thank you for the referral.    Herbert Deaner, PT,DPT

## 2023-08-31 DIAGNOSIS — I10 Essential (primary) hypertension: Secondary | ICD-10-CM | POA: Diagnosis not present

## 2023-08-31 LAB — BASIC METABOLIC PANEL
Anion Gap: 13 (ref 7–16)
CO2: 26 mmol/L (ref 20–28)
Calcium: 8.3 mg/dL — ABNORMAL LOW (ref 8.6–10.2)
Chloride: 104 mmol/L (ref 96–108)
Creatinine: 0.43 mg/dL — ABNORMAL LOW (ref 0.51–0.95)
Glucose: 90 mg/dL (ref 60–99)
Lab: 5 mg/dL — ABNORMAL LOW (ref 6–20)
Potassium: 2.7 mmol/L — CL (ref 3.3–4.6)
Sodium: 143 mmol/L (ref 133–145)
eGFR BY CREAT: 100 *

## 2023-08-31 LAB — CK: CK: 810 U/L — ABNORMAL HIGH (ref 26–192)

## 2023-08-31 LAB — MAGNESIUM: Magnesium: 1.4 mg/dL — ABNORMAL LOW (ref 1.6–2.5)

## 2023-08-31 MED ORDER — POTASSIUM CHLORIDE CRYS CR 20 MEQ PO TBCR *I*
40.0000 meq | ORAL_TABLET | Freq: Once | ORAL | Status: AC
Start: 2023-08-31 — End: 2023-08-31
  Administered 2023-08-31: 40 meq via ORAL
  Filled 2023-08-31: qty 2

## 2023-08-31 MED ORDER — POTASSIUM CHLORIDE 10 MEQ/50ML IV SOLN *I*
10.0000 meq | Freq: Once | INTRAVENOUS | Status: AC
Start: 2023-08-31 — End: 2023-08-31
  Administered 2023-08-31: 10 meq via INTRAVENOUS
  Filled 2023-08-31: qty 50

## 2023-08-31 MED ORDER — MAGNESIUM SULFATE 2 GM IN 50 ML *WRAPPED*
2000.0000 mg | Freq: Once | INTRAVENOUS | Status: AC
Start: 2023-08-31 — End: 2023-08-31
  Administered 2023-08-31: 2000 mg via INTRAVENOUS
  Filled 2023-08-31: qty 50

## 2023-08-31 MED ORDER — AMLODIPINE BESYLATE 5 MG PO TABS *I*
5.0000 mg | ORAL_TABLET | Freq: Every day | ORAL | Status: DC
Start: 2023-08-31 — End: 2023-09-01
  Administered 2023-08-31 – 2023-09-01 (×2): 5 mg via ORAL
  Filled 2023-08-31 (×2): qty 1

## 2023-08-31 MED ORDER — AMLODIPINE BESYLATE 5 MG PO TABS *I*
5.0000 mg | ORAL_TABLET | Freq: Once | ORAL | Status: DC
Start: 2023-08-31 — End: 2023-08-31

## 2023-08-31 NOTE — Progress Notes (Signed)
Pt BP is 192/98. PRN labetalol given for BP.

## 2023-08-31 NOTE — Progress Notes (Addendum)
General Daily SOAP Progress Note for Inpatients   LOS: 0 days     Subjective  The patient was seen on bed. No CP, SOB. No abdominal complaints. No fever. No pain. Feeling much better today.     Vitals: Blood pressure 144/90, pulse 74, temperature 35.8 C (96.5 F), temperature source Temporal, resp. rate 18, height 1.448 m (4\' 9" ), weight 60.3 kg (133 lb), SpO2 92%.   Vital-Signs Ranges: Temp:  [35.8 C (96.4 F)-36.6 C (97.9 F)] 35.8 C (96.5 F)  Heart Rate:  [68-88] 74  Resp:  [16-20] 18  BP: (137-198)/(77-109) 144/90    Objective:  General Appearance:  Comfortable.    Vital signs: (most recent): Blood pressure 144/90, pulse 74, temperature 35.8 C (96.5 F), temperature source Temporal, resp. rate 18, height 1.448 m (4\' 9" ), weight 60.3 kg (133 lb), SpO2 92%.    Lungs:  Normal effort and normal respiratory rate.  Breath sounds clear to auscultation.    Heart: Normal rate.  Regular rhythm.  S1 normal and S2 normal.    Abdomen: Abdomen is soft.  There is no abdominal tenderness.     Neurological: Patient is alert.          Lab Results: All labs in the last 24 hours:   Recent Results (from the past 24 hours)   CK    Collection Time: 08/31/23  3:36 AM   Result Value Ref Range    CK 810 (H) 26 - 192 U/L   Basic metabolic panel    Collection Time: 08/31/23  3:36 AM   Result Value Ref Range    Glucose 90 60 - 99 mg/dL    Sodium 010 932 - 355 mmol/L    Potassium 2.7 (LL) 3.3 - 4.6 mmol/L    Chloride 104 96 - 108 mmol/L    CO2 26 20 - 28 mmol/L    Anion Gap 13 7 - 16    UN 5 (L) 6 - 20 mg/dL    Creatinine 7.32 (L) 0.51 - 0.95 mg/dL    eGFR BY CREAT 202 *    Calcium 8.3 (L) 8.6 - 10.2 mg/dL   Magnesium    Collection Time: 08/31/23  3:36 AM   Result Value Ref Range    Magnesium 1.4 (L) 1.6 - 2.5 mg/dL       Radiology impressions (last 3 days):  MR head without contrast    Result Date: 08/30/2023  Unremarkable exam END OF IMPRESSION       [facility] submits this DICOM format image data and final report to the [facility], an independent secure electronic health information exchange, on a reciprocally searchable basis (with patient authorization) for a minimum of 12 months after exam date.    Pelvis standard AP view    Result Date: 08/29/2023  No acute fracture, dislocation, or diastasis. END OF IMPRESSION I have personally reviewed the images and the Resident's/Fellow's interpretation and agree with or edited the findings.       [facility] submits this DICOM format image data and final report to the [facility], an independent secure electronic health information exchange, on a reciprocally searchable basis (with patient authorization) for a minimum of 12 months after exam date.    * Femur RIGHT standard AP and Lateral    Result Date: 08/29/2023  Osteopenia. No acute fracture is seen. END OF IMPRESSION I have personally reviewed the images and the Resident's/Fellow's interpretation and agree with or edited the findings.  [facility] submits this DICOM format image data and final report to the [facility], an independent secure electronic health information exchange, on a reciprocally searchable basis (with patient authorization) for a minimum of 12 months after exam date.    CT angio neck carotid for stroke    Result Date: 08/29/2023  4 mm saccular aneurysm projecting inferolaterally near the MCA trifurcation. Patent anterior and posterior circulations of the brain, without large vessel occlusion. Patent carotid and vertebral arteries of the neck, without stenosis or dissection. A presumed web is present at the right carotid bifurcation. These findings were communicated to Dr. Bonney Leitz by clerical staff at approximately 9:54 a.m. END OF IMPRESSION I have personally reviewed the images and the Resident's/Fellow's interpretation and agree with or edited the findings.       [facility] submits this DICOM format image data and final report to the [facility], an independent secure electronic health information  exchange, on a reciprocally searchable basis (with patient authorization) for a minimum of 12 months after exam date.    CT angio head for stroke    Result Date: 08/29/2023  4 mm saccular aneurysm projecting inferolaterally near the MCA trifurcation. Patent anterior and posterior circulations of the brain, without large vessel occlusion. Patent carotid and vertebral arteries of the neck, without stenosis or dissection. A presumed web is present at the right carotid bifurcation. These findings were communicated to Dr. Bonney Leitz by clerical staff at approximately 9:54 a.m. END OF IMPRESSION I have personally reviewed the images and the Resident's/Fellow's interpretation and agree with or edited the findings.       [facility] submits this DICOM format image data and final report to the [facility], an independent secure electronic health information exchange, on a reciprocally searchable basis (with patient authorization) for a minimum of 12 months after exam date.    EKG 12 lead (initial)    Result Date: 08/29/2023  Sinus rhythm Left atrial enlargement    *Chest STANDARD single view    Result Date: 08/29/2023  No acute cardiopulmonary disease. END OF IMPRESSION       [facility] submits this DICOM format image data and final report to the [facility], an independent secure electronic health information exchange, on a reciprocally searchable basis (with patient authorization) for a minimum of 12 months after exam date.    CT head without contrast for stroke    Result Date: 08/29/2023  No acute/subacute transcortical infarct. No acute intracranial hemorrhage. Mild generalized brain volume loss. Mild chronic microvascular white matter changes. END OF IMPRESSION I have personally reviewed the images and the Resident's/Fellow's interpretation and agree with or edited the findings.       [facility] submits this DICOM format image data and final report to the [facility], an independent secure electronic health  information exchange, on a reciprocally searchable basis (with patient authorization) for a minimum of 12 months after exam date.     Currently Active/Followed Hospital Problems:  Active Hospital Problems    Diagnosis     *!*Rhabdomyolysis     Accelerated hypertension     Fall     Fall, initial encounter     COPD (chronic obstructive pulmonary disease)     Hypertension     Coronary arteriosclerosis     Generalized anxiety disorder        Assessment and Plan Section:  Assessment & Plan  77 yo woman with hx of HTN, bipolar, memory problems (?) who presented  with a fall and rhabdomyolysis. She thinks she might have taken the wrong meds (accidental OD).   1. Falls/Rhabdomyolysis  -CT head did not show any acute events. CTA showed 4 mm aneurysm.   -MRI was unremarkable.   -CK improving. Stopping IV fluids.  2. HTN  -On amlodipine 2.5 mg daily, isosorbide 30 mg daily, metoprolol XL 25 mg daily.   3. Bipolar  -On pregabalin 100 mg TID, venlafaxine 37.5 mg daily.   4. DVT prophylaxis  -On enoxaparin SC.  DISPO:   -PT/OT.     Author: Mingo Amber, MD  as of: 08/31/2023  at: 12:11 PM

## 2023-08-31 NOTE — Plan of Care (Signed)
Problem: Safety  Goal: Patient will remain free of falls  Outcome: Maintaining  Goal: Prevent any intentional injury  Outcome: Maintaining     Problem: Pain/Comfort  Goal: Patient's pain or discomfort is manageable  Outcome: Maintaining     Problem: Nutrition  Goal: Nutritional status is maintained or improved - Geriatric  Outcome: Maintaining     Problem: Mobility  Goal: Functional status is maintained or improved - Geriatric  Outcome: Maintaining     Problem: Psychosocial  Goal: Demonstrates ability to cope with illness  Outcome: Maintaining     Problem: Cognitive function  Goal: Cognitive function will be maintained or return to baseline  Description: Interventions:  Delirium Assessment  LIVEBAR Assessment    Outcome: Maintaining  Goal: Lines and tethers will be removed as appropriate  Outcome: Maintaining  Goal: Patient maintains appropriate nutritional intake  Outcome: Maintaining  Goal: Vital signs will be within normal limits  Outcome: Maintaining  Goal: Evidence for potential causes of delirium will be managed  Outcome: Maintaining  Goal: Behaviors will return to baseline  Outcome: Maintaining  Goal: Ambulation and mobility will be maintained  Outcome: Maintaining  Goal: Retention of urine and constipation will be managed  Outcome: Maintaining     Problem: Potential Alteration in Skin Integrity - Pressure Ulcer  Goal: The patient will maintain intact skin free of pressure ulcer  Outcome: Maintaining     Problem: Risk for Impaired Sleep/Wake Cycle  Goal: The patient will maintain an adequate sleep/wake cycle  Outcome: Maintaining     Problem: Bowel Elimination  Goal: Elimination pattern is normal or improving  Outcome: Maintaining     Problem: Bladder Elimination  Goal: Patient is able to empty bladder or return to baseline  Outcome: Maintaining     Problem: Risk for Falls  Goal: No falls during hospitalization  Description: Patient will not fall during hospitalization.  Outcome: Maintaining     Problem:  Knowledge Deficit  Goal: Knowledge - personal safety  Description: Patient will verbalize understanding of fall prevention.  Outcome: Maintaining

## 2023-08-31 NOTE — Discharge Instructions (Addendum)
[facility] get [facility] to schedule your home Physical and Occupational therapy appointment. [phone]    Nurse appointment in your home scheduled for Wednesday, 09/06/23 at 12:00 PM with Willodean Rosenthal #[id number]  Follow-up appointment with Primary Care Physician, Lorie Phenix for Thursday, 09/07/23 at 1:00 PM  Clinician appointment in your home scheduled for Friday, 09/08/23 at 1:00 PM with Maryruth Hancock, Fraser Din #[id number]       Date of admission: 08/29/2023  Date of discharge: 09/01/2023      Associated Diagnoses:   Active Hospital Problems    Diagnosis     Accelerated hypertension     Fall     Fall, initial encounter     COPD (chronic obstructive pulmonary disease)     Hypertension     Coronary arteriosclerosis     Generalized anxiety disorder        Hospital Course:  You were admitted to [facility] for treatment of mild rhabdomyolysis following a fall, which you were provided IV fluids for.  Your home medications have been restarted, take as prescribed.  An MRI of your head was due to recurrent falls and you were found to have a very small aneurysm with recommendation for follow up with Neurosurgery outpatient, referral was placed and their office should be calling you to set up this appointment.    Education: see attached    Take your medications as prescribed.    Follow up with your PCP within 7-10 days of discharge.  Notify your PCP for further medication refills.  Please sign up with [employer] app to have access to your medical information.    Follow up labs: None    Discharge Activity:  Activity as tolerated  Home physical and occupational therapies    Discharge Diet:  Regular Diet    Complications:  Call your PCP's office if you are experiencing any of the following: chest pain, shortness of breath, acute abdominal pain, no BM>4 days, unable to void, fever/chills, any prolonged bleeding if on blood thinner medication

## 2023-08-31 NOTE — Continuity of Care (Signed)
[place] [facility] ([facility]) set up over the phone today with patient. In home appointment set up with Raynelle Fanning, nurse on Wednesday, 09/06/23 at 12:00 PM and appointment set up with Maryruth Hancock, Clinician on Friday 09/08/23 at 1:00 PM.    Telephone call made to Fredrich Romans, CM at Primary Care Physician office of Lorie Phenix, NP to provide an update to her. She will follow up with patient.    Appointment set up with Primary Care Physician, Lorie Phenix, NP for Thursday, 09/07/23 at 1:00 PM.

## 2023-08-31 NOTE — Plan of Care (Signed)
Problem: Safety  Goal: Patient will remain free of falls  Outcome: Maintaining  Goal: Prevent any intentional injury  Outcome: Maintaining     Problem: Pain/Comfort  Goal: Patient's pain or discomfort is manageable  Outcome: Maintaining     Problem: Nutrition  Goal: Nutritional status is maintained or improved - Geriatric  Outcome: Maintaining     Problem: Mobility  Goal: Functional status is maintained or improved - Geriatric  Outcome: Maintaining     Problem: Psychosocial  Goal: Demonstrates ability to cope with illness  Outcome: Maintaining     Problem: Cognitive function  Goal: Cognitive function will be maintained or return to baseline  Outcome: Maintaining  Goal: Lines and tethers will be removed as appropriate  Outcome: Maintaining  Goal: Patient maintains appropriate nutritional intake  Outcome: Maintaining  Goal: Vital signs will be within normal limits  Outcome: Maintaining  Goal: Evidence for potential causes of delirium will be managed  Outcome: Maintaining  Goal: Behaviors will return to baseline  Outcome: Maintaining  Goal: Ambulation and mobility will be maintained  Outcome: Maintaining  Goal: Retention of urine and constipation will be managed  Outcome: Maintaining     Problem: Potential Alteration in Skin Integrity - Pressure Ulcer  Goal: The patient will maintain intact skin free of pressure ulcer  Outcome: Maintaining     Problem: Risk for Impaired Sleep/Wake Cycle  Goal: The patient will maintain an adequate sleep/wake cycle  Outcome: Maintaining     Problem: Bowel Elimination  Goal: Elimination pattern is normal or improving  Outcome: Maintaining     Problem: Bladder Elimination  Goal: Patient is able to empty bladder or return to baseline  Outcome: Maintaining     Problem: Risk for Falls  Goal: No falls during hospitalization  Description: Patient will not fall during hospitalization.  Outcome: Maintaining     Problem: Knowledge Deficit  Goal: Knowledge - personal safety  Description: Patient  will verbalize understanding of fall prevention.  Outcome: Maintaining

## 2023-08-31 NOTE — Progress Notes (Signed)
PT Adult Assessment - 08/31/23 0850          Prior Living     Prior Living Situation Reported by patient;Obtained via chart        Prior Function Level    Prior Function Level Obtained via chart        PT Tracking    PT TRACKING PT Assigned     Type of Session follow up/treatment        Precautions/Observations    Precautions used Yes     Was patient wearing a mask? No     PPE worn by Clinical research associate [facility]     Fall Precautions General falls precautions        Bed Mobility    Bed mobility Tested     Supine to Sit Independent     Sit to Supine Independent        Transfers    Transfers Tested     Sit to Stand Stand by assistance     Stand to sit Stand by assistance     Transfer Assistive Device none        Mobility    Mobility Tested     Gait Pattern Decreased cadence;Decreased R step height;Decreased L step height     Ambulation Assist Stand by     Ambulation Distance (Feet) 200     Ambulation Assistive Device None        Family/Caregiver Training`    Patient/Family/Caregiver training Yes     Patient training Role of physical therapy in hospital and plan for evaluation and follow up;Discharge planning;PT plan of care after evaluation        Functional Outcome Measures    Functional Outcome Measures Yes        PT AM-PAC Mobility    Turning over in bed? None: Modified Independence/independent     Moving from lying on back to sitting on the side of the bed? None: Modified Independence/independent     Moving to and from a bed to a chair? A Little: Minimum/Contact Guard Assist/Supervision     Sitting down on and standing up from a chair with arms? A Little: Minimum/Contact Guard Assist/Supervision     Need to walk in hospital room? A Little: Minimum/Contact Guard Assist/Supervision     Climbing 3 - 5 steps with a railing? A Little: Minimum/Contact Guard Assist/Supervision     Total Raw Score 20     Standardized Score - Calculated 47.67     % Functional Impairment - Calculated 36%        Assessment    Brief Assessment Remains  appropriate for skilled therapy     Problem List Impaired LE strength;Impaired endurance;Impaired ambulation;Impaired functional status;Impaired functional mobility     Patient / Family Goal to feel better     Overall Assessment Pt continues to present slightly below her baseline level of function and has been recovering well since being admitted. Pt presented with improved ability to ambulate and complete transfers today, along with completing toileting activities without LOB or feeling like she might pass out. Currently recommending Home PT with intermittent supervison/assistance.        Plan/Recommendation    PT Treatment Interventions Restorative PT;Assess functional mobility;D/C planning     PT Frequency 2-4x/wk     PT Mobility Recommendations CGAx1     PT Referral Recommendations OT;Home care     PT Discharge Recommendations Home PT;Intermittent supervision/assist     PT Discharge Equipment Recommended To be determined  PT Assessment/Recommendations Reviewed With: Care coordinator;Patient        Time Calculation    PT Timed Codes 1     PT Untimed Codes 0     PT Unbilled Time 0     PT Total Treatment 1        Plan and Onset date    Plan of Care Date 08/30/23     Onset Date 08/29/23     Treatment Start Date 08/30/23                   Upon arrival, pt resting in bed. Pt agreeable to PT services this morning. Pt able to transfer from supine to sitting at EOB with SBAx1 and then transferred to standing from EOB with SBAx1 with no assistive device. Line management taken place for IV pole. Pt ambulated into hallway and walked the length of the inpatient floor and back to her room with SBAx1, ~200 ft, then returned to her room and sat on EOB. Pt then requested to use the bathroom and was able to complete all toileting activities independently, except for needing assistance with donning her briefs. Pt then returned to sitting on EOB with SBAx1 and was made comfortable. Nursing in room to give meds at the end of the  session.     Currently recommending Home PT with intermittent assistance/supervision.    Sofie Rower, PTA

## 2023-08-31 NOTE — Continuity of Care (Signed)
Return phone call received from [facility], CM [facility] He states that he was providing transportation for patient when she was going to [facility] counseling, but is not currently going anymore. He states that he feels that she has a safe discharge and her home is adequate and would be safe to discharge her home.    Referral sent to [facility] and they have accepted her and will follow-up with her after she is discharged home.    Patient was evaluated by Physical and Occupational therapy and they are recommending home therapy. Patient is agreeable to home therapy. Choices for home therapy, [facility] and [facility] provided to patient and she has chosen [facility]. [facility] sent to the [facility]. [facility] accepted. [facility] phone number placed on discharge instructions.    Patient informed.

## 2023-09-01 LAB — BASIC METABOLIC PANEL
Anion Gap: 13 (ref 7–16)
CO2: 25 mmol/L (ref 20–28)
Calcium: 9.3 mg/dL (ref 8.6–10.2)
Chloride: 103 mmol/L (ref 96–108)
Creatinine: 0.44 mg/dL — ABNORMAL LOW (ref 0.51–0.95)
Glucose: 102 mg/dL — ABNORMAL HIGH (ref 60–99)
Lab: 5 mg/dL — ABNORMAL LOW (ref 6–20)
Potassium: 3.4 mmol/L (ref 3.3–4.6)
Sodium: 141 mmol/L (ref 133–145)
eGFR BY CREAT: 100 *

## 2023-09-01 LAB — MAGNESIUM: Magnesium: 1.8 mg/dL (ref 1.6–2.5)

## 2023-09-01 LAB — CK: CK: 604 U/L — ABNORMAL HIGH (ref 26–192)

## 2023-09-01 MED ORDER — LISINOPRIL 20 MG PO TABS *I*
40.0000 mg | ORAL_TABLET | Freq: Every day | ORAL | Status: DC
Start: 2023-09-01 — End: 2023-09-01
  Administered 2023-09-01: 40 mg via ORAL
  Filled 2023-09-01: qty 2

## 2023-09-01 MED ORDER — POTASSIUM CHLORIDE CRYS CR 20 MEQ PO TBCR *I*
40.0000 meq | ORAL_TABLET | Freq: Once | ORAL | Status: AC
Start: 2023-09-01 — End: 2023-09-01
  Administered 2023-09-01: 40 meq via ORAL
  Filled 2023-09-01: qty 2

## 2023-09-01 MED ORDER — MAGNESIUM SULFATE 2 GM IN 50 ML *WRAPPED*
2000.0000 mg | Freq: Once | INTRAVENOUS | Status: AC
Start: 2023-09-01 — End: 2023-09-01
  Administered 2023-09-01: 2000 mg via INTRAVENOUS
  Filled 2023-09-01: qty 50

## 2023-09-01 MED ORDER — ONDANSETRON 4 MG PO TBDP *I*
4.0000 mg | ORAL_TABLET | Freq: Three times a day (TID) | ORAL | Status: DC | PRN
Start: 2023-09-01 — End: 2023-09-01
  Administered 2023-09-01: 4 mg via ORAL
  Filled 2023-09-01: qty 1

## 2023-09-01 NOTE — Progress Notes (Signed)
D/C order received. Educated patient on d/c instructions, verbalized understanding and denies questions or needs. IV removed. All belongings and paperwork sent home with patient via [employer].

## 2023-09-01 NOTE — Continuity of Care (Cosign Needed)
Received pt in bed with eyes closed. Pt woke to name being called. Pt is alert and oriented x3. Pt states that she has a headache and her back hurts. 8/10 on pain scale. PRN oxycodone given per emar. Will recheck pain level in approx. 45 minutes. Pt states that her needs and wants have been met at this time. Safety measures in place. Call bell and bedside table within reach.

## 2023-09-01 NOTE — Continuity of Care (Signed)
Patient is medically ready for discharge home today. Transportation set up via Medicaid travel with [employer] and they will be picking up patient at 3:30 PM today.

## 2023-09-01 NOTE — Discharge Summary (Signed)
 Patient Name: Kylie Parker Date of Admission: 08/29/2023   DOB: 1946-10-06 Date of Discharge: 09/01/2023   Patient MR#: [id number] Admitting Physician: Caron Presume, MD   Primary Care Physician: Lorie Phenix, PA  Discharge Physician: Caron Presume, MD       [facility] Internal Medicine Discharge Summary     Discharge Diagnoses       Active Hospital Problems    Diagnosis     Accelerated hypertension     Fall     Fall, initial encounter     COPD (chronic obstructive pulmonary disease)     Hypertension     Coronary arteriosclerosis     Generalized anxiety disorder      Hospital Course     Kylie Parker is a 77 y.o. female with past medical history significant for HTN, CAD, COPD, anxiety     [name] on 08/29/23 who was brought in by EMS after being found down at home. Patient reporting have recurrent falls recently. Reporting that she doesn't know how she fell this time, as it was early in the morning. Stating that she "always falls to her right side". Last fall prior to this she states that she fell getting up from the couch. Denies any recent illness, no fever, chills, chest pain, shortness of breath, abdominal pain, nausea, vomiting, urinary or bowel changes. States that she does take percocet for chronic back pain and recently had her dose increased. Denies any loss of consciousness with falls or hitting her head. Denies any changes in sensation in arms or legs, or one side being weaker than the other.        ED course:   Work up with labs noted for elevated CK of 2844, flat troponin trend, but otherwise stable to baseline. UA doesn't appear to be infectious.   Xrays of right femur and pelvis with no fractures.  CT head with no acute hemorrhage or transcortical infarct.  CTA head/neck noted 4mm saccular aneurysm, no vascular occulsion. Neurology consulted, Dr. Durward Mallard, recommending MRI brain and outpatient referral to neurosurgery for aneurysm  finding.      Recurrent falls, mild rhabdomyolysis  Incidental small saccular aneurysm  -Noted for elevated CK of 2844  -CT head with no acute hemorrhage or transcortical infarct.  -CTA head/neck noted 4mm saccular aneurysm, no vascular occulsion. -Neurology consulted, Dr. Durward Mallard, recommending MRI brain which was negative for any acute findings and outpatient referral to neurosurgery for aneurysm finding, which as been placed at discharge.  -Provided IV hydration with resolution of rhabdomyolysis  -PT/OT evaluated and recommending home therapies  -Medically stable for discharge home. Follow up with PCP at discharge.     COPD  -No current wheezing  -Chest xray on admission unremarkable  -Patient reporting that she does not use her inhaler at home  -PRN nebs available     HTN  -Continue amlodipine 2.5mg , Metoprolol succinate 25mg  daily, and isosorbide mononitrate 30mg  daily  -Holding lisinopril 40mg  daily due to soft BP on arrival and recurrent falls, along with appearing dry on exam.   -Restarted home lisinopril as patient's BP has steadily increasing     HLD  -Home ezetimibe nonformulary, restart at discharge     Anxiety  -Continue home venlafexine 37.5mg  daily and xanax 0.5mg  prn      Procedures     None    Consults     None    Discharge Physical Exam     Vitals:  BP Marland Kitchen)  155/106 Comment: rn notified  Pulse 86   Temp 36 C (96.8 F) (Temporal)   Resp 19   Ht 1.448 m (4\' 9" )   Wt 60.3 kg (133 lb)   SpO2 93%   BMI 28.78 kg/m   Oxygen Requirement:        Weight:  Wt Readings from Last 3 Encounters:   08/29/23 60.3 kg (133 lb)   08/15/23 64.9 kg (143 lb)   04/18/23 63 kg (139 lb)     Physical Exam by Systems:  General: Not in acute distress. Conversant and able to make needs known.  HEENT: Normocephalic, atraumatic. PERRLA. EOMI.   NECK: Supple, trachea is in midline  CVS: S1 S2 heard, RRR, no appreciable murmurs/rubs/gallops  PULM: normal effort, CTAB  ABD: Bowel sounds active, soft, non tender, not  distended.  EXT: No clubbing, cyanosis, or edema. Distal pulses 2+  NEURO: A and O to person, place, time and current events. Cranial nerves II-XII are grossly intact. Sensations intact. Strength 5/5 in bilateral upper and lower extremities.  MSK: ROM intact throughout.    SKIN: Warm, dry and intact with no rashes or ulcers.   PSYCH: Appropriate mood and affect.    Foley: no    Labs     CBC BMP   Recent Labs   Lab 08/30/23  0439 08/29/23  0603   WBC 5.2 6.0   Hemoglobin 15.7 16.4*   Hematocrit 50* 52*   Platelets 200 206        Recent Labs   Lab 09/01/23  0353 08/31/23  0336 08/30/23  0439 08/29/23  0603   Sodium 141 143 141 140   Potassium 3.4 2.7* 3.3 4.0   Chloride 103 104 102 98   CO2 25 26 24 26    UN 5* 5* 5* 8   Creatinine 0.44* 0.43* 0.45* 0.46*   Glucose 102* 90 72 105*   Calcium 9.3 8.3* 8.8 9.6   Albumin  --   --   --  3.7   Phosphorus  --   --   --  2.1*        Coags LFTs   No results for input(s): "APTT", "INR", "PTT", "PTI" in the last 168 hours.     Recent Labs   Lab 08/29/23  0603   Alk Phos 71   Bilirubin,Total 0.6   Albumin 3.7   ALT 19   AST 99*   Total Protein 7.1        Cardiac Enzymes Other   No results for input(s): "CKTS", "TROPU", "TROP", "MCKMB", "CKMB" in the last 168 hours.    No components found with this basename: "RICKMBS"   ABG:   Recent Labs   Lab 08/29/23  0600   Lactate 2.0        Microbiology results   Bacterial Blood Culture   Date Value Ref Range Status   08/29/2023 .  Preliminary   08/29/2023 .  Preliminary          Radiology Reports   MR head without contrast    Result Date: 08/30/2023  Unremarkable exam END OF IMPRESSION       [facility] submits this DICOM format image data and final report to the [facility], an independent secure electronic health information exchange, on a reciprocally searchable basis (with patient authorization) for a minimum of 12 months after exam date.   Discharge Medications     Current Discharge Medication List  CONTINUE these medications  which have NOT CHANGED    Details AM Noon PM Bedtime   amLODIPine 2.5 mg tablet Take 1 tablet (2.5 mg total) by mouth daily.              venlafaxine 37.5 mg 24 hr capsule Take 1 capsule (37.5 mg total) by mouth daily.              LYRICA 100 MG capsule Take 1 capsule (100 mg total) by mouth 3 times daily.              oxyCODONE-acetaminophen (PERCOCET) 10-325 mg per tablet Take 1 tablet by mouth every 8 hours as needed for Pain.              lisinopril (PRINIVIL,ZESTRIL) 40 mg tablet               metoprolol (TOPROL-XL) 25 MG 24 hr tablet Take 1 tablet (25 mg total) by mouth daily. Do not crush or chew. May be divided.              isosorbide mononitrate (IMDUR) 30 MG 24 hr tablet Take 1 tablet (30 mg total) by mouth daily.              albuterol-ipratropium (COMBIVENT) 18-103 MCG/ACT inhaler Inhale 2 puffs into the lungs daily. Shake well before each use.              alprazolam (XANAX) 0.5 MG tablet Take 1 tablet (0.5 mg total) by mouth 2 times daily as needed.              ezetimibe (ZETIA) 10 MG tablet Take 1 tablet (10 mg total) by mouth daily.              aspirin 81 MG EC tablet Take 1 tablet (81 mg total) by mouth daily.              ondansetron (ZOFRAN) 8 mg tablet Take 1 tablet (8 mg total) by mouth 3 times daily.              melatonin 3 MG Take 1 tablet (3 mg total) by mouth nightly as needed.              Ascorbic Acid (VITAMIN C) 250 MG tablet Take 1 tablet (250 mg total) by mouth daily.              Calcium Carbonate-Vitamin D (CALCIUM + D) 600-200 MG-UNIT per tablet Take 2 tablets by mouth daily.              Multiple Vitamin (MULTIVITAMIN) per tablet Take 1 tablet by mouth daily.                   STOP taking these medications       oxycodone-acetaminophen (PERCOCET) 5-325 MG per tablet Comments:   Reason for Stopping:             Discharge Instructions     Follow up with your PCP within 7-10 days of discharge.  Notify your PCP for further medication refills.  Please sign up with [employer] app to have access to your medical information.    Follow up labs: None    Discharge Activity:  Activity as tolerated  Home physical and occupational therapies      Discharge Diet:  Regular Diet    Complications:  Call your PCP

## 2023-09-03 ENCOUNTER — Observation Stay: Payer: Medicare (Managed Care)

## 2023-09-03 ENCOUNTER — Other Ambulatory Visit: Payer: Self-pay

## 2023-09-03 ENCOUNTER — Observation Stay
Admission: EM | Admit: 2023-09-03 | Discharge: 2023-09-05 | Disposition: A | Discharge status: Home / Self Care | Payer: Medicare (Managed Care) | Source: Ambulatory Visit | Attending: Internal Medicine | Admitting: Internal Medicine

## 2023-09-03 DIAGNOSIS — R000 Tachycardia, unspecified: Secondary | ICD-10-CM

## 2023-09-03 DIAGNOSIS — I700 Atherosclerosis of aorta: Secondary | ICD-10-CM

## 2023-09-03 DIAGNOSIS — D751 Secondary polycythemia: Secondary | ICD-10-CM | POA: Insufficient documentation

## 2023-09-03 DIAGNOSIS — F17210 Nicotine dependence, cigarettes, uncomplicated: Secondary | ICD-10-CM | POA: Insufficient documentation

## 2023-09-03 DIAGNOSIS — R531 Weakness: Principal | ICD-10-CM | POA: Insufficient documentation

## 2023-09-03 DIAGNOSIS — F411 Generalized anxiety disorder: Secondary | ICD-10-CM | POA: Insufficient documentation

## 2023-09-03 DIAGNOSIS — Z79899 Other long term (current) drug therapy: Secondary | ICD-10-CM | POA: Insufficient documentation

## 2023-09-03 DIAGNOSIS — R55 Syncope and collapse: Secondary | ICD-10-CM | POA: Insufficient documentation

## 2023-09-03 DIAGNOSIS — G8929 Other chronic pain: Secondary | ICD-10-CM | POA: Insufficient documentation

## 2023-09-03 DIAGNOSIS — E875 Hyperkalemia: Principal | ICD-10-CM | POA: Insufficient documentation

## 2023-09-03 DIAGNOSIS — I7143 Infrarenal abdominal aortic aneurysm, without rupture: Secondary | ICD-10-CM

## 2023-09-03 DIAGNOSIS — K869 Disease of pancreas, unspecified: Secondary | ICD-10-CM

## 2023-09-03 DIAGNOSIS — F319 Bipolar disorder, unspecified: Secondary | ICD-10-CM | POA: Diagnosis present

## 2023-09-03 DIAGNOSIS — Z720 Tobacco use: Secondary | ICD-10-CM

## 2023-09-03 DIAGNOSIS — Z79891 Long term (current) use of opiate analgesic: Secondary | ICD-10-CM | POA: Insufficient documentation

## 2023-09-03 DIAGNOSIS — J449 Chronic obstructive pulmonary disease, unspecified: Secondary | ICD-10-CM | POA: Insufficient documentation

## 2023-09-03 DIAGNOSIS — E785 Hyperlipidemia, unspecified: Secondary | ICD-10-CM | POA: Insufficient documentation

## 2023-09-03 DIAGNOSIS — R062 Wheezing: Secondary | ICD-10-CM | POA: Insufficient documentation

## 2023-09-03 DIAGNOSIS — I10 Essential (primary) hypertension: Secondary | ICD-10-CM | POA: Diagnosis present

## 2023-09-03 DIAGNOSIS — E279 Disorder of adrenal gland, unspecified: Secondary | ICD-10-CM

## 2023-09-03 HISTORY — DX: Essential (primary) hypertension: I10

## 2023-09-03 LAB — BASIC METABOLIC PANEL
Anion Gap: 14 (ref 7–16)
CO2: 25 mmol/L (ref 20–28)
Calcium: 10.2 mg/dL (ref 8.6–10.2)
Chloride: 100 mmol/L (ref 96–108)
Creatinine: 0.57 mg/dL (ref 0.51–0.95)
Glucose: 82 mg/dL (ref 60–99)
Lab: 12 mg/dL (ref 6–20)
Potassium: 5.1 mmol/L — ABNORMAL HIGH (ref 3.3–4.6)
Sodium: 139 mmol/L (ref 133–145)
eGFR BY CREAT: 94 *

## 2023-09-03 LAB — CBC AND DIFFERENTIAL
Baso # K/uL: 0.1 10*3/uL (ref 0.0–0.2)
Eos # K/uL: 0 10*3/uL (ref 0.0–0.5)
Hematocrit: 51 % — ABNORMAL HIGH (ref 34–49)
Hemoglobin: 16.2 g/dL — ABNORMAL HIGH (ref 11.2–16.0)
IMM Granulocytes #: 0 10*3/uL (ref 0.0–0.0)
IMM Granulocytes: 0.4 %
Lymph # K/uL: 1 10*3/uL (ref 1.0–5.0)
MCV: 86 fL (ref 75–100)
Mono # K/uL: 0.5 10*3/uL (ref 0.1–1.0)
Neut # K/uL: 3.3 10*3/uL (ref 1.5–6.5)
Nucl RBC # K/uL: 0 10*3/uL (ref 0.0–0.0)
Nucl RBC %: 0 /100{WBCs} (ref 0.0–0.2)
Platelets: 192 10*3/uL (ref 150–450)
RBC: 5.9 MIL/uL — ABNORMAL HIGH (ref 4.0–5.5)
RDW: 18.2 % — ABNORMAL HIGH (ref 0.0–15.0)
Seg Neut %: 67.1 %
WBC: 4.9 10*3/uL (ref 3.5–11.0)

## 2023-09-03 LAB — EKG 12-LEAD
P: 59 deg
PR: 124 ms
QRS: 31 deg
QRSD: 80 ms
QT: 334 ms
QTc: 439 ms
Rate: 103 {beats}/min
T: 31 deg

## 2023-09-03 LAB — CK: CK: 274 U/L — ABNORMAL HIGH (ref 26–192)

## 2023-09-03 LAB — BLOOD CULTURE
Bacterial Blood Culture: 0
Bacterial Blood Culture: 0

## 2023-09-03 MED ORDER — AMLODIPINE BESYLATE 5 MG PO TABS *I*
2.5000 mg | ORAL_TABLET | Freq: Once | ORAL | Status: AC
Start: 2023-09-03 — End: 2023-09-03
  Administered 2023-09-03: 2.5 mg via ORAL
  Filled 2023-09-03: qty 1

## 2023-09-03 MED ORDER — ACETAMINOPHEN 325 MG PO TABS *I*
650.0000 mg | ORAL_TABLET | Freq: Four times a day (QID) | ORAL | Status: DC | PRN
Start: 2023-09-03 — End: 2023-09-05
  Filled 2023-09-03: qty 2

## 2023-09-03 MED ORDER — MELATONIN 3 MG PO TABS *I*
6.0000 mg | ORAL_TABLET | Freq: Every evening | ORAL | Status: DC | PRN
Start: 2023-09-03 — End: 2023-09-05

## 2023-09-03 MED ORDER — ALPRAZOLAM 0.5 MG PO TABS *I*
0.5000 mg | ORAL_TABLET | Freq: Two times a day (BID) | ORAL | Status: DC | PRN
Start: 2023-09-03 — End: 2023-09-05
  Administered 2023-09-04: 0.5 mg via ORAL
  Filled 2023-09-03: qty 1

## 2023-09-03 MED ORDER — PREGABALIN 100 MG PO CAPS *I*
100.0000 mg | ORAL_CAPSULE | Freq: Three times a day (TID) | ORAL | Status: DC
Start: 2023-09-03 — End: 2023-09-05
  Administered 2023-09-03 – 2023-09-05 (×6): 100 mg via ORAL
  Filled 2023-09-03 (×6): qty 1

## 2023-09-03 MED ORDER — METOPROLOL SUCCINATE 25 MG PO TB24 *I*
25.0000 mg | ORAL_TABLET | Freq: Every day | ORAL | Status: DC
Start: 2023-09-04 — End: 2023-11-02
  Administered 2023-09-04 – 2023-09-05 (×2): 25 mg via ORAL
  Filled 2023-09-03 (×2): qty 1

## 2023-09-03 MED ORDER — PREGABALIN 100 MG PO CAPS *I*
100.0000 mg | ORAL_CAPSULE | Freq: Once | ORAL | Status: AC
Start: 2023-09-03 — End: 2023-09-03
  Administered 2023-09-03: 100 mg via ORAL
  Filled 2023-09-03: qty 1

## 2023-09-03 MED ORDER — SODIUM CHLORIDE 0.9 % IV SOLN WRAPPED *I*
75.0000 mL/h | Status: AC
Start: 2023-09-03 — End: 2023-09-04
  Administered 2023-09-03 – 2023-09-04 (×2): 75 mL/h via INTRAVENOUS

## 2023-09-03 MED ORDER — VENLAFAXINE HCL 37.5 MG PO CP24 *I*
37.5000 mg | ORAL_CAPSULE | Freq: Every day | ORAL | Status: DC
Start: 2023-09-04 — End: 2023-11-02
  Administered 2023-09-04 – 2023-09-05 (×2): 37.5 mg via ORAL
  Filled 2023-09-03 (×2): qty 1

## 2023-09-03 MED ORDER — LISINOPRIL 20 MG PO TABS *I*
40.0000 mg | ORAL_TABLET | Freq: Every day | ORAL | Status: DC
Start: 2023-09-04 — End: 2023-09-05
  Administered 2023-09-04 – 2023-09-05 (×2): 40 mg via ORAL
  Filled 2023-09-03 (×2): qty 2

## 2023-09-03 MED ORDER — ACETAMINOPHEN 500 MG PO TABS *I*
1000.0000 mg | ORAL_TABLET | Freq: Once | ORAL | Status: AC
Start: 2023-09-03 — End: 2023-09-03
  Administered 2023-09-03: 1000 mg via ORAL
  Filled 2023-09-03: qty 2

## 2023-09-03 MED ORDER — ISOSORBIDE MONONITRATE CR 30 MG PO TB24 *I*
30.0000 mg | ORAL_TABLET | Freq: Every day | ORAL | Status: DC
Start: 2023-09-04 — End: 2023-09-05
  Administered 2023-09-04 – 2023-09-05 (×2): 30 mg via ORAL
  Filled 2023-09-03 (×2): qty 1

## 2023-09-03 MED ORDER — AMLODIPINE BESYLATE 2.5 MG PO TABS *I*
2.5000 mg | ORAL_TABLET | Freq: Every day | ORAL | Status: DC
Start: 2023-09-04 — End: 2023-11-02
  Administered 2023-09-04 – 2023-09-05 (×2): 2.5 mg via ORAL
  Filled 2023-09-03 (×2): qty 1

## 2023-09-03 MED ORDER — ACETAMINOPHEN 650 MG RE SUPP *I*
650.0000 mg | Freq: Four times a day (QID) | RECTAL | Status: DC | PRN
Start: 2023-09-03 — End: 2023-11-02

## 2023-09-03 MED ORDER — OXYCODONE HCL 5 MG PO TABS *I*
5.0000 mg | ORAL_TABLET | Freq: Once | ORAL | Status: AC
Start: 2023-09-03 — End: 2023-09-03
  Administered 2023-09-03: 5 mg via ORAL
  Filled 2023-09-03: qty 1

## 2023-09-03 MED ORDER — ASPIRIN 81 MG PO TBEC *I*
81.0000 mg | DELAYED_RELEASE_TABLET | Freq: Every day | ORAL | Status: DC
Start: 2023-09-04 — End: 2023-09-05
  Administered 2023-09-04 – 2023-09-05 (×2): 81 mg via ORAL
  Filled 2023-09-03 (×2): qty 1

## 2023-09-03 MED ORDER — IOHEXOL 350 MG/ML (OMNIPAQUE) IV SOLN 500ML BOTTLE *I*
1.0000 mL | Freq: Once | INTRAVENOUS | Status: AC
Start: 2023-09-03 — End: 2023-09-03
  Administered 2023-09-03: 80 mL via INTRAVENOUS

## 2023-09-03 MED ORDER — ENOXAPARIN SODIUM 40 MG/0.4ML IJ SOSY *I*
40.0000 mg | PREFILLED_SYRINGE | Freq: Every day | INTRAMUSCULAR | Status: DC
Start: 2023-09-03 — End: 2023-11-02
  Administered 2023-09-03 – 2023-09-04 (×2): 40 mg via SUBCUTANEOUS
  Filled 2023-09-03 (×2): qty 0.4

## 2023-09-03 MED ORDER — OXYCODONE HCL 10 MG PO TABS *I*
10.0000 mg | ORAL_TABLET | Freq: Three times a day (TID) | ORAL | Status: DC | PRN
Start: 2023-09-03 — End: 2023-09-05

## 2023-09-03 MED ORDER — IPRATROPIUM-ALBUTEROL 0.5-2.5 MG/3ML IN SOLN *I*
3.0000 mL | RESPIRATORY_TRACT | Status: DC | PRN
Start: 2023-09-03 — End: 2023-09-05

## 2023-09-03 NOTE — Progress Notes (Signed)
Current use for oxygen/CPAP/Bipap: room air    Patient home use for oxygen/Bipap/ CPAP: none     Patient takes the following respiratory medications at home:none per history    RT will continue to wean oxygen and follow patient throughout patient visit.

## 2023-09-03 NOTE — ED Notes (Signed)
Report given to Rose RN. All questions answered.

## 2023-09-03 NOTE — ED Triage Notes (Signed)
Pt BIBA with c/o of generalized weakness that began yesterday afternoon. Pt states that she progressively got more weak and wasn't able to walk. Pt endorsed SOB that is chronic for her but denies chest pain. VSS.

## 2023-09-03 NOTE — H&P (Signed)
Patient Name: Kylie Parker Date of Admission: 09/03/2023   DOB: 1945-11-21 Primary Care Physician: Lorie Phenix, PA   Patient MR#: [id number]  Attending Physician: Rick Duff, MD      [facility] Internal Medicine History and Physical Note     Chief Complaint     Weakness     History of Presenting Illness       Kylie Parker is a 77 y.o. female with past medical history significant for chronic pain, COPD, bipolar disorder, Hypertension, HLD.     Presenting to [name] with generalized weakness. This is the third visit in the past 3 weeks for weakness and difficulty ambulating. She has had recurrent falls and was hospitalized at our facility from 11/12 to 11/15  after a fall with rhabdomyolysis and concern for TIA/stroke, due to gait preference to the right. She was treated with IV fluids and underwent MRI of the head on 11/13 which showed no acute abnormality. She was referred to neurosurgery outpatient for 4mm saccular aneurysm. She was evaluated by physical therapy and discharged with home services. She felt well when she first got home but last night she started to feel more weak and unbalanced. She feels like "she wobbles and sways side to side", she doesn't appreciate one side being weaker than the other. This morning she had a hard time getting up out of bed and due to her weakness she was very concerned about falling so she called the ambulance.  She does have chronic back pain which has not changed much in quality or severity, she recently received an oxycodone and has minimal pain at this time.  She also reports headache and high blood pressures at home. Headache is in located in the forehead. Her blood pressure was systolic in the 170s this morning, as soon as she got into the ambulance systolic was in the 140s. She denies numbness, tingling, fevers, chills, cough, dysuria, change in appetite, shortness of breath.     ED course:   Vitals stable to baseline. Labs stable to  baseline with chronically elevated Hgb and hematocrit.  CPK 274. Potassium mildly elevated at 5.1. no EKG changes.     14 Point Review of Systems     Positive symptoms denoted in RED. Negative symptoms denoted in BLACK    Constitutional weight loss, weight gain, fever, chills, malaise   Eyes diplopia, blurred vision   Ear/Nose/Throat  hearing loss, ringing in ears, sinus congestion, loss of smell, sore throat, altered taste   Gastrointestinal nausea, vomiting, diarrhea, constipation, abdominal pain   Integumentary skin rash, mole, dryness, pigmentation   Endocrine polyuria, polydipsia, cold-intolerance, heat-intolerance   Genitourinary  hematuria, incontinence, dysuria   Hematologic/Lymphatic bruising, bleeding   Cardiovascular  chest pain, palpitations, PND, orthopnea   Respiratory shortness of breath, cough, wheezing    Musculoskeletal  joint pain, joint stiffness, joint swelling, myalgias   Neurological dizziness, syncope, seizure, vertigo, weakness, loss of sensation, headache   Psychiatric depression, anxiety, hallucination   Allergies/Immunologic  hives, swelling of lips, swelling of tongue      Past Medical History Past Surgical History   Past Medical History:   Diagnosis Date    ADHD (attention deficit hyperactivity disorder)     Angina     Arthritis     Bipolar 1 disorder     Chest pain, unspecified     pressure    COPD (chronic obstructive pulmonary disease)     Fibromyalgia     Hypertension  Shortness of breath     Past Surgical History:   Procedure Laterality Date    COLON SURGERY      abcess    HAND SURGERY Bilateral     trigger finger bilateral    TONSILLECTOMY        Social History Family History   Social History     Socioeconomic History    Marital status: Divorced   Tobacco Use    Smoking status: Every Day     Packs/day: 1.00     Years: 50.00     Additional pack years: 0.00     Total pack years: 50.00     Types: Cigarettes    Smokeless tobacco: Never   Substance and Sexual Activity    Alcohol  use: No    Drug use: No    Sexual activity: Not Currently      Family History   Problem Relation Age of Onset    Heart Disease Mother     Heart Disease Father     Cancer Sister         Leukemia        Allergies     Allergies   Allergen Reactions    Cephalosporins Other (See Comments)     Severe confusion    Seasonal Allergies Itching    Acid Blockers Support Rash    Bee Venom Other (See Comments)     Hornets    Proton Pump Inhibitors Rash     Medications     Hospital administered medications:  Scheduled Meds:   [START ON 09/04/2023] amLODIPine  2.5 mg Oral Daily    [START ON 09/04/2023] aspirin  81 mg Oral Daily    [START ON 09/04/2023] isosorbide mononitrate  30 mg Oral Daily    [START ON 09/04/2023] lisinopril  40 mg Oral Daily    pregabalin  100 mg Oral TID    [START ON 09/04/2023] metoprolol succinate ER  25 mg Oral Daily    [START ON 09/04/2023] venlafaxine  37.5 mg Oral Daily    enoxaparin  40 mg Subcutaneous Daily @ 2100     PRN Meds:   ipratropium-albuterol  3 mL Nebulization Q4H PRN    ALPRAZolam  0.5 mg Oral BID PRN    oxyCODONE  10 mg Oral Q8H PRN    acetaminophen  650 mg Oral Q6H PRN    Or    acetaminophen  650 mg Rectal Q6H PRN    melatonin  6 mg Oral QHS PRN     Continuous Infusions:   sodium chloride       Home Medications:  Prior to Admission medications    Medication Sig Start Date End Date Taking? Authorizing Provider   amLODIPine 2.5 mg tablet Take 1 tablet (2.5 mg total) by mouth daily. 07/14/23  Yes [provider]   venlafaxine 37.5 mg 24 hr capsule Take 1 capsule (37.5 mg total) by mouth daily.   Yes [provider]   LYRICA 100 MG capsule Take 1 capsule (100 mg total) by mouth 3 times daily.   Yes [provider]   oxyCODONE-acetaminophen (PERCOCET) 10-325 mg per tablet Take 1 tablet by mouth every 8 hours as needed for Pain. 07/21/23  Yes [provider]   ondansetron (ZOFRAN) 8 mg tablet Take 1 tablet (8 mg total) by mouth 3 times daily. 08/21/20  Yes  [provider]   lisinopril (PRINIVIL,ZESTRIL) 40 mg tablet  08/21/20  Yes [provider]   metoprolol (  TOPROL-XL) 25 MG 24 hr tablet Take 1 tablet (25 mg total) by mouth daily. Do not crush or chew. May be divided.   Yes [provider]   isosorbide mononitrate (IMDUR) 30 MG 24 hr tablet Take 1 tablet (30 mg total) by mouth daily.   Yes [provider]   albuterol-ipratropium (COMBIVENT) 18-103 MCG/ACT inhaler Inhale 2 puffs into the lungs daily. Shake well before each use.   Yes [provider]   melatonin 3 MG Take 1 tablet (3 mg total) by mouth nightly as needed.   Yes [provider]   Ascorbic Acid (VITAMIN C) 250 MG tablet Take 1 tablet (250 mg total) by mouth daily.   Yes [provider]   alprazolam Prudy Feeler) 0.5 MG tablet Take 1 tablet (0.5 mg total) by mouth 2 times daily as needed. 03/19/10  Yes [provider]   ezetimibe (ZETIA) 10 MG tablet Take 1 tablet (10 mg total) by mouth daily.   Yes [provider]   aspirin 81 MG EC tablet Take 1 tablet (81 mg total) by mouth daily.   Yes [provider]   Calcium Carbonate-Vitamin D (CALCIUM + D) 600-200 MG-UNIT per tablet Take 2 tablets by mouth daily.   Yes [provider]   Multiple Vitamin (MULTIVITAMIN) per tablet Take 1 tablet by mouth daily.   Yes [provider]      Physical Exam     Vital Signs:  Vitals:    09/03/23 1127 09/03/23 1145 09/03/23 1201 09/03/23 1316   BP:   140/82    BP Location:       Pulse: 90  101    Resp: 17  18    Temp:  36.8 C (98.2 F) 35.9 C (96.7 F)    TempSrc:  Temporal Temporal    SpO2: 99%  99%    Weight:    52.4 kg (115 lb 8.3 oz)   Height:         Oxygen Requirement:        Weight:  Wt Readings from Last 3 Encounters:   09/03/23 52.4 kg (115 lb 8.3 oz)   08/29/23 60.3 kg (133 lb)   08/15/23 64.9 kg (143 lb)     General: Not in acute distress. Conversant and able to make needs known.  HEENT: Normocephalic, atraumatic.  PERRLA. EOMI. No lesions on nose or ears. MMM  NECK: Supple, trachea is in midline, no palpable lymphadenopathy.  CVS: S1 S2 heard, RRR, no appreciable murmurs/rubs/gallops  PULM: normal effort, CTAB, with no appreciable wheezes, rales, or rhonchi  ABD: Bowel sounds active, soft, non tender, not distended. No guarding rigidity or rebound tenderness.   EXT: No clubbing, cyanosis, or edema. Distal pulses 2+  NEURO: A and O to person, place, time and current events. Cranial nerves II-XII are grossly intact.  Sensations intact. Strength 4/5 in upper extremities and right lower extremity, 3/5 in left leg  MSK: No joint swelling or tenderness. ROM intact throughout.   SKIN: Warm, dry and intact with no rashes or ulcers.   PSYCH: Appropriate mood and affect.    Foley: no    Results     Most recent EKG: sinus tachycardia     CBC BMP   Recent Labs   Lab 09/03/23  0835 08/30/23  0439 08/29/23  0603   WBC 4.9 5.2 6.0   Hemoglobin 16.2* 15.7 16.4*   Hematocrit 51* 50* 52*   Platelets 192 200 206  Recent Labs   Lab 09/03/23  0835 09/01/23  0353 08/31/23  0336 08/30/23  0439 08/29/23  0603   Sodium 139 141 143   < > 140   Potassium 5.1* 3.4 2.7*   < > 4.0   Chloride 100 103 104   < > 98   CO2 25 25 26    < > 26   UN 12 5* 5*   < > 8   Creatinine 0.57 0.44* 0.43*   < > 0.46*   Glucose 82 102* 90   < > 105*   Calcium 10.2 9.3 8.3*   < > 9.6   Albumin  --   --   --   --  3.7   Phosphorus  --   --   --   --  2.1*    < > = values in this interval not displayed.        Coags LFTs   No results for input(s): "APTT", "INR", "PTT", "PTI" in the last 168 hours.     Recent Labs   Lab 08/29/23  0603   Alk Phos 71   Bilirubin,Total 0.6   Albumin 3.7   ALT 19   AST 99*   Total Protein 7.1        Cardiac Enzymes Other   No results for input(s): "CKTS", "TROPU", "TROP", "MCKMB", "CKMB" in the last 168 hours.    No components found with this basename: "RICKMBS"   ABG:   Recent Labs   Lab 08/29/23  0600   Lactate 2.0        Microbiology  results   Bacterial Blood Culture   Date Value Ref Range Status   08/29/2023 .  Preliminary   08/29/2023 .  Preliminary          Radiology Results   EKG 12 lead    Result Date: 09/03/2023  Sinus tachycardia   Hospital Problems     Active Hospital Problems    Diagnosis     *!*Weakness     Polycythemia     Hyperkalemia     Hypertension     HLD (hyperlipidemia)     Chronic pain     Tobacco use     COPD (chronic obstructive pulmonary disease)     Bipolar I disorder     Generalized anxiety disorder      Assessment and Plan     SHAKORA VANCE is a 77 y.o. female with past medical history significant for chronic pain, COPD, bipolar disorder, Hypertension, HLD.  Presenting to [name] with generalized weakness.     >Weakness   -differentials include deconditioning, dehydration, nerve entrapment, less likely spinal cord compression, stroke or myopathy  -pt reports generalized weakness, left leg>right on physical exam, recent MRI of the head for weakness on 11/13 with no acute abnormality, no other focal symptoms   -CPK 271  -will order MRI of the lumbar spine for 11/18   -hydrate with normal saline   -PT/OT evaluation tomorrow     >Polycythemia   -HGB 16.2, HCT 51   -this has been longstanding so less likely due to dehydration   -CT of the abdomen and pelvis pending to evaluate for adrenal mass/renal mass (also with labile blood pressures)  -JAK2 mutation pending   -hydrating as well, continue to monitor     >Hyperkalemia   -mild hyperkalemia 5.1, hydrating with normal saline   -recheck with AM labs     >Hypertension   -recently  labile blood pressure, [place] of renal arteries pending  -continue lisinopril 40 mg daily, Imdur 30 mg daily, amlodipine 2.5 mg daily and metoprolol succinate 25 mg daily     >Chronic pain   -continue Lyrica 100 mg TID, and prn oxycodone 10 mg     >Tobacco use   -declines nicotine patch at this time     >HLD   -Home ezetimibe nonformulary, restart at discharge    >COPD   -PRN nebs  available    >Bipolar disorder/generalized anxiety   -Continue home venlafexine 37.5mg  daily and xanax 0.5mg  prn    Fluids & Electrolytes: normal saline 75 cc/hr   Nutrition: Diet regular  Mobility Plan/Ability:  Up with assistance. PT/OT pending  DVT prophylaxis: Lovenox    Code Status: DNR/DNI     Disposition: Admit patient as observation status. Anticipate 24-48 hour hospital stay.     Patient discussed with Dr. Brendolyn Patty, who is in agreement with this plan.    Total care time >75 minutes today, > 50% spent counseling and coordinating care.    Signed:   Leana Gamer, PA  09/03/2023  2:07 PM

## 2023-09-03 NOTE — Progress Notes (Signed)
Patient came from ER in wheelchair, alert and oriented, able to make needs known. Denies pain and others complains at this moment. Room air saturation 99%. IV in right arm saline locked. Bed low, bed alarm on, call bell ad table with belongings within reach. Re inforced to call for help. Maintaining safety and fall precautions.

## 2023-09-03 NOTE — Continuity of Care (Cosign Needed)
Received pt in bed with eyes closed. Pt woke to name being called. Pt is alert and oriented x3.with occasional confusion. Pt states she has no pain at this time. There is bruising from mid thigh to mid calf on the right leg. It's marked on all edges d/t pt being on lovenox pt's IV is saline locked and she is on room air. Bed alarm on. Safety measures in place. Call bell and bedside table within reach.

## 2023-09-03 NOTE — ED Provider Notes (Signed)
History     Chief Complaint   Patient presents with    Generalized Body Aches     77yoF hx bipolar 1, COPD, intracranial aneurysm, recent hospitalization here with ongoing weakness and gait instability. She reports she has had these symptoms for at least a month leading to the most recent hospital visits. She denies falls or injuries since discharge. Reporting she has weakness all over, feels she is wobbling back and forth "from one side to another" without preference to one side. Reports holding on to things to walk around, is afraid she is going to fall. Reports difficulty with balance at baseline. Reporting headaches are ongoing, no changes in pattern or intensity, reports these have been present for years. Discharged Friday and did well, yesterday was uneventful. This morning woke and was so weak and unstable she couldn't safely get to the bathroom. Concerned about her labile BP as well.             Medical/Surgical/Family History     Past Medical History:   Diagnosis Date    ADHD (attention deficit hyperactivity disorder)     Angina     Arthritis     Bipolar 1 disorder     Chest pain, unspecified     pressure    COPD (chronic obstructive pulmonary disease)     Fibromyalgia     Hypertension     Shortness of breath         Patient Active Problem List   Diagnosis Code    Numbness in both hands R20.0    Carpal tunnel syndrome on both sides G56.03    Cervical radiculopathy at C6 M54.12    Fall W19.XXXA    COPD (chronic obstructive pulmonary disease) J44.9    Hypertension I10    Bipolar I disorder F31.9    Coronary arteriosclerosis I25.10    Generalized anxiety disorder F41.1    Fall, initial encounter [email or web address].Kylie Parker    Accelerated hypertension I10            Past Surgical History:   Procedure Laterality Date    COLON SURGERY      abcess    HAND SURGERY Bilateral     trigger finger bilateral    TONSILLECTOMY            Social History     Tobacco Use    Smoking status: Every Day     Packs/day: 1.00     Years: 50.00      Additional pack years: 0.00     Total pack years: 50.00     Types: Cigarettes    Smokeless tobacco: Never   Substance Use Topics    Alcohol use: No    Drug use: No             Review of Systems    Physical Exam     Triage Vitals  Triage Start: Start, (09/03/23 0753)  First Recorded BP: 146/90, Resp: 20, Temp: 36.2 C (97.2 F), Temp src: TEMPORAL Oxygen Therapy SpO2: 96 %, Oximetry Source: Rt Hand, O2 Device: None (Room air), Heart Rate: 95, (09/03/23 0759)  .  First Pain Reported  0-10 Scale: 0, (09/03/23 0759)       Physical Exam  Constitutional:       General: She is not in acute distress.     Appearance: Normal appearance.      Comments: Smells of cigarettes   HENT:      Head: Normocephalic and atraumatic.  Nose: Nose normal. No rhinorrhea.      Mouth/Throat:      Mouth: Mucous membranes are moist.      Pharynx: Oropharynx is clear.   Eyes:      Extraocular Movements: Extraocular movements intact.      Pupils: Pupils are equal, round, and reactive to light.   Cardiovascular:      Rate and Rhythm: Normal rate and regular rhythm.      Pulses: Normal pulses.      Heart sounds: No murmur heard.  Pulmonary:      Effort: Pulmonary effort is normal. No respiratory distress.      Breath sounds: Wheezing present.   Abdominal:      General: There is no distension.      Tenderness: There is no abdominal tenderness.   Musculoskeletal:         General: No deformity or signs of injury.      Cervical back: Normal range of motion and neck supple.   Skin:     General: Skin is warm and dry.   Neurological:      Mental Status: She is alert and oriented to person, place, and time.      Cranial Nerves: No cranial nerve deficit.      Sensory: No sensory deficit.      Motor: Weakness present.      Gait: Gait abnormal.      Comments: 3/5 bilateral legs  4+/5 bilateral arms  Able to stand with some assistance, walks with one assist but unstable gait, not consistently to one side       Medical Decision Making     Assessment:  77yoF  here reporting ongoing weakness    Differential diagnosis:  Recent hospitalization and evaluation did not find evidence of ICH, increased ICP, no SAH despite the aneurysm, some electrolyte disturbance but not adequate to explain the extent of weakness. No falls, no lateralizing signs of stroke, reporting identical symptoms to prior hospitaliation. Reasonable to recheck for electrolyte disturbance or renal failure with some question of decreased PO but likely not safe for discharge and will need ongoing evaluation and PT prior to going home. No evidence of significant COPD exacerbation, no obvious acute spinal process through a chronic compression could certainly be contributing.     ED Course and Disposition:  ED evaluation without obvious emergency. Discussed with the covering hospitalist, accepted to their service for ongoing evaluation and treatment, consideration for spine MRI if no clear etiology is identified.          Clearance Coots, MD            Clearance Coots, MD  09/05/23 [phone]

## 2023-09-04 ENCOUNTER — Observation Stay
Admit: 2023-09-04 | Discharge: 2023-09-04 | Disposition: A | Discharge status: Home / Self Care | Payer: Medicare (Managed Care)

## 2023-09-04 ENCOUNTER — Observation Stay: Payer: Medicare (Managed Care)

## 2023-09-04 DIAGNOSIS — M47816 Spondylosis without myelopathy or radiculopathy, lumbar region: Secondary | ICD-10-CM

## 2023-09-04 DIAGNOSIS — I358 Other nonrheumatic aortic valve disorders: Secondary | ICD-10-CM

## 2023-09-04 DIAGNOSIS — M48061 Spinal stenosis, lumbar region without neurogenic claudication: Secondary | ICD-10-CM

## 2023-09-04 DIAGNOSIS — I701 Atherosclerosis of renal artery: Secondary | ICD-10-CM

## 2023-09-04 LAB — CBC AND DIFFERENTIAL
Baso # K/uL: 0 10*3/uL (ref 0.0–0.2)
Eos # K/uL: 0.1 10*3/uL (ref 0.0–0.5)
Hematocrit: 44 % (ref 34–49)
Hemoglobin: 13.5 g/dL (ref 11.2–16.0)
IMM Granulocytes #: 0 10*3/uL (ref 0.0–0.0)
IMM Granulocytes: 0.3 %
Lymph # K/uL: 1 10*3/uL (ref 1.0–5.0)
MCV: 89 fL (ref 75–100)
Mono # K/uL: 0.5 10*3/uL (ref 0.1–1.0)
Neut # K/uL: 1.8 10*3/uL (ref 1.5–6.5)
Nucl RBC # K/uL: 0 10*3/uL (ref 0.0–0.0)
Nucl RBC %: 0 /100{WBCs} (ref 0.0–0.2)
Platelets: 167 10*3/uL (ref 150–450)
RBC: 5 MIL/uL (ref 4.0–5.5)
RDW: 17.5 % — ABNORMAL HIGH (ref 0.0–15.0)
Seg Neut %: 53.5 %
WBC: 3.4 10*3/uL — ABNORMAL LOW (ref 3.5–11.0)

## 2023-09-04 LAB — ECHO COMPLETE
AV Gradient (mean): -1.2 mm[Hg]
Aortic Diameter (mid tubular): 3.1 cm
Aortic Diameter (sino-tubular junction): 2.4 cm
Aortic Diameter (sinus of Valsalva): 2.9 cm
BMI: 25.1 kg/m2
BSA: 1.45 m2
Deceleration Time - MV: 645 ms
E/A ratio: 0.52
Echo RV Stroke Work Index Estimate: 1140 mmHg•mL/m2
Heart Rate: 90 {beats}/min
Height: 57 in
IVC Diameter: 1.5 mm
Interventricular Septum Systolic Thickness: 1.57 cm
LA Diameter BSA Index: 2.1 cm/m2
LA Diameter Height Index: 2.1 cm/m
LA Diameter: 3.1 cm
LA Systolic Vol BSA Index: 17.4 mL/m2
LA Systolic Vol Height Index: 17.4 mL/m
LA Systolic Volume: 25.2 mL
LAA Orifice Area: 11.7 cm2
LV ASE Mass BSA Index: 117.2 g/m2
LV ASE Mass Height 2.7 Index: 62.6 g/m2
LV ASE Mass Height Index: 117.4 gm/m
LV ASE Mass: 169.9 g
LV CO BSA Index: 2.36 L/min/m2
LV Cardiac Output: 3.42 L/min
LV Diastolic Volume Index: 33.4 mL/m2
LV Posterior Wall Thickness: 1.52 cm
LV SV BSA Index: 26.2 mL/m2
LV SV Height Index: 26.2 mL/m
LV Septal Thickness: 1.34 cm
LV Stroke Volume: 38 mL
LV Systolic Volume Index: 7.2 mL/m2
LV wall/cavity ratio: 0.85
LVED Diameter BSA Index: 2.32 cm/m2
LVED Diameter Height Index: 2.33 cm/m
LVED Diameter: 3.37 cm
LVED Volume BSA Index: 33 mL/m2
LVED Volume BSA Index: 33.4 mL/m2
LVED Volume Height Index: 33.5 mL/m
LVED Volume: 48.5 mL
LVEF (Volume): 78 %
LVES Diameter BSA Index: 1.38 cm/m2
LVES Diameter Height Index: 1.38 cm/m
LVES Diameter: 2 cm
LVES Volume BSA Index: 7 mL/m2
LVES Volume BSA Index: 7.2 mL/m2
LVES Volume Height Index: 7.3 mL/m
LVES Volume: 10.5 mL
LVOT + AV Gradient (mean): 5.5 mm[Hg]
LVOT Area (calculated): 2.38 cm2
LVOT Diameter: 1.7 cm
LVOT Diameter: 1.74 cm
LVOT PWD Velocity (mean): 129 cm/s
LVOT PWD Velocity (peak): 188.7 cm/s
LVOT Stroke Rate (mean): 306.6 mL/s
LVOT Stroke Rate (peak): 448.5 mL/s
Left Ventricle Posterior Wall Systolic Thickness: 1.86 cm
MV Area (LV SV Mtd): 1.21 cm2
MV Area (LV SV) BSA Index: 0.83 cm2/m2
MV CWD Gradient (Mean): 3 mm[Hg]
MV CWD Gradient (Peak): 8.5 mm[Hg]
MV CWD VTI: 31.5 cm
MV Peak A Velocity: 172.3 cm/s
MV Peak E Velocity: 90.1 cm/s
Mean Velocity MV: 80 cm/s
Mitral Annular E/Ea Vel Ratio: 15.02
Mitral Annular Ea Velocity: 6 cm/s
PA Systolic Pressure Estimate: 48.5 mm[Hg]
PV CWD Velocity (peak): 0.9 cm/s
Peak Gradient - TR: 43.5 mm[Hg]
Peak Velocity - MV: 146 cm/s
Peak Velocity - TR: 341.68 cm/s
Pulmonary Vascular Resistance Estimate: 12.7 mm[Hg]
RA Pressure Estimate: 5 mm[Hg]
RA Volume BSA Index: 18.6 mL/m2
RA Volume Height Index: 18.6 mL/m
RA Volume: 27 mL
RR Interval: 666.67 ms
RV Peak Systolic Pressure: 48.5 mm[Hg]
RVED Diameter BSA Index: 2.4 cm/m2
RVED Diameter Height Index: 2.4 cm/m
RVED Diameter: 3.51 cm
RVOT + PV Gradient (peak): 0 mm[Hg]
Tricuspid Annular S velocity: 11 cm/s
Weight (lbs): 115.52 [lb_av]
Weight: 1848.3 [oz_av]

## 2023-09-04 LAB — BASIC METABOLIC PANEL
Anion Gap: 9 (ref 7–16)
CO2: 25 mmol/L (ref 20–28)
Calcium: 8.5 mg/dL — ABNORMAL LOW (ref 8.6–10.2)
Chloride: 106 mmol/L (ref 96–108)
Creatinine: 0.71 mg/dL (ref 0.51–0.95)
Glucose: 89 mg/dL (ref 60–99)
Lab: 15 mg/dL (ref 6–20)
Potassium: 3.8 mmol/L (ref 3.3–4.6)
Sodium: 140 mmol/L (ref 133–145)
eGFR BY CREAT: 88 *

## 2023-09-04 LAB — CORTISOL: Cortisol,Random: 9.8 ug/dL

## 2023-09-04 LAB — CA 19 9 (EFF. 01-2011): CA 19 9 (eff. 01-2011): 9 U/mL (ref 0–35)

## 2023-09-04 NOTE — Progress Notes (Signed)
Patient Name: Kylie Parker Date of Admission: 09/03/2023   DOB: [birth year] Primary Care Physician: Lorie Phenix, PA   Patient MR#: [id number]  Attending Physician: Lula Olszewski, MD      [facility] Internal Medicine Progress Note     Interval history     Date of service: 09/04/23   LOS: 0 days       Subjective     Patient seen and examined at bedside.  She reports episodes of passing out at home.  She was recently discharged home and is back because she passed out, she is unable to provide any more details.  She denies presyncopal episode.     she otherwise denies fever, chills, headache, chest pain, SOB, abdominal pain, nausea, vomiting, diarrhea, constipation, or weakness.    Objective     Vitals:  Vitals:    09/03/23 2312 09/04/23 0342 09/04/23 0709 09/04/23 1131   BP: 126/66 160/88 154/83 127/77   BP Location: Right arm Right arm     Pulse: 90 91 84 95   Resp: 16 18 18 18    Temp: 36.1 C (97 F) 35.8 C (96.5 F) 35.8 C (96.5 F) 36.2 C (97.1 F)   TempSrc: Temporal Temporal Temporal Temporal   SpO2: 95% 96% 92% 90%   Weight:       Height:         Oxygen Requirement:        Ins/Outs:     Intake/Output Summary (Last 24 hours) at 09/04/2023 1456  Last data filed at 09/04/2023 1359  Gross per 24 hour   Intake 480 ml   Output --   Net 480 ml     Weight:  Wt Readings from Last 3 Encounters:   09/03/23 52.4 kg (115 lb 8.3 oz)   08/29/23 60.3 kg (133 lb)   08/15/23 64.9 kg (143 lb)     Physical Exam by Systems:  General: Not in acute distress. Conversant and able to make needs known.  CVS: S1 S2 heard, RRR, no appreciable murmurs/rubs/gallops  PULM: normal effort, CTAB, with no appreciable wheezes, rales, or rhonchi  ABD: Bowel sounds active, soft, non tender, not distended. No guarding rigidity or rebound tenderness.   EXT: No clubbing, cyanosis, or edema. Distal pulses 2+  NEURO: A and O to person, place, time and current events. Cranial nerves II-XII are grossly intact.  Sensations intact.  Strength 5/5 in bilateral upper and lower extremities.  MSK: No joint swelling or tenderness. ROM intact throughout.    SKIN: Warm, dry and intact with no rashes or ulcers.   PSYCH: Appropriate mood and affect.    Foley: no    Active Hospital Medications     Scheduled Meds    amLODIPine  2.5 mg Oral Daily    aspirin  81 mg Oral Daily    isosorbide mononitrate  30 mg Oral Daily    lisinopril  40 mg Oral Daily    pregabalin  100 mg Oral TID    metoprolol succinate ER  25 mg Oral Daily    venlafaxine  37.5 mg Oral Daily    enoxaparin  40 mg Subcutaneous Daily @ 2100     PRN Meds   ipratropium-albuterol  3 mL Nebulization Q4H PRN    ALPRAZolam  0.5 mg Oral BID PRN    oxyCODONE  10 mg Oral Q8H PRN    acetaminophen  650 mg Oral Q6H PRN    Or    acetaminophen  650  mg Rectal Q6H PRN    melatonin  6 mg Oral QHS PRN     Continuous Infusions      Labs     CBC BMP   Recent Labs   Lab 09/04/23  0447 09/03/23  0835 08/30/23  0439   WBC 3.4* 4.9 5.2   Hemoglobin 13.5 16.2* 15.7   Hematocrit 44 51* 50*   Platelets 167 192 200        Recent Labs   Lab 09/04/23  0447 09/03/23  0835 09/01/23  0353 08/30/23  0439 08/29/23  0603   Sodium 140 139 141   < > 140   Potassium 3.8 5.1* 3.4   < > 4.0   Chloride 106 100 103   < > 98   CO2 25 25 25    < > 26   UN 15 12 5*   < > 8   Creatinine 0.71 0.57 0.44*   < > 0.46*   Glucose 89 82 102*   < > 105*   Calcium 8.5* 10.2 9.3   < > 9.6   Albumin  --   --   --   --  3.7   Phosphorus  --   --   --   --  2.1*    < > = values in this interval not displayed.        Coags LFTs   No results for input(s): "APTT", "INR", "PTT", "PTI" in the last 168 hours.     Recent Labs   Lab 08/29/23  0603   Alk Phos 71   Bilirubin,Total 0.6   Albumin 3.7   ALT 19   AST 99*   Total Protein 7.1        Cardiac Enzymes Other   No results for input(s): "CKTS", "TROPU", "TROP", "MCKMB", "CKMB" in the last 168 hours.    No components found with this basename: "RICKMBS"   ABG:   Recent Labs   Lab 08/29/23  0600   Lactate 2.0         Microbiology results   Bacterial Blood Culture   Date Value Ref Range Status   08/29/2023 .  Final   08/29/2023 .  Final          Radiology Results   US doppler renal artery and vein    Result Date: 09/04/2023  Elevated velocity demonstrated proximally in the left renal artery which is likely secondary to a hemodynamically significant stenosis. In the appropriate clinical setting consider referral to vascular and interventional radiology clinic for further evaluation. Mildly increased cortical reflectivity in both kidneys which can be seen in medical renal disease. END OF IMPRESSION       [facility] submits this DICOM format image data and final report to the [facility], an independent secure electronic health information exchange, on a reciprocally searchable basis (with patient authorization) for a minimum of 12 months after exam date.    MR lumbar spine without contrast    Result Date: 09/04/2023  Multilevel degenerative changes, most pronounced at L4-L5 where there is moderate to severe spinal canal stenosis with crowding of the bilateral subarticular zones. END OF IMPRESSION       [facility] submits this DICOM format image data and final report to the [facility], an independent secure electronic health information exchange, on a reciprocally searchable basis (with patient authorization) for a minimum of 12 months after exam date.    CT abdomen and pelvis with IV contrast  Result Date: 09/03/2023  Again seen is a 1.9 x 1.3 cm left adrenal lesion which similar in size when compared to the prior CT abdomen pelvis from 12/23/2014, however with interval development of internal calcifications. This finding is indeterminate and may represent a benign adenoma with chronic calcifications versus a developing adrenal cortical carcinoma. A follow-up CT or MRI adrenal protocol is recommended for further evaluation Subtle 1.1 cm isoattenuating lesion within the periampullary region with mild dilation of the  main pancreatic duct and common bile duct, and mild intrahepatic biliary duct dilation. This is an indeterminate and a neoplasm cannot be totally excluded. An MRI/MRCP with contrast is recommended for further evaluation. Severe atherosclerotic plaque within the abdominal aorta and its branch vessels. Eccentric thrombosis/ulcerated noncalcified plaques increase the patient's risk of embolization. Infrarenal aortic aneurysm. . Partially visualized large heterogeneous fluid collections in the bilateral adductor musculature recommend clinical correlation and/or dedicated CT or MRI for further evaluation 6 mm pleural-based left lower lobe pulmonary nodule. Additional tree-in-bud nodularity in the left lung base as above. If no prior imaging available for comparison and the patient is high risk, consider dedicated CT of the chest Moderate pulmonary emphysema Findings to be communicated to the provider through the imaging support team, 6:56 p.m. END OF IMPRESSION I have personally reviewed the images and the Resident's/Fellow's interpretation and agree with or edited the findings.       [facility] submits this DICOM format image data and final report to the [facility], an independent secure electronic health information exchange, on a reciprocally searchable basis (with patient authorization) for a minimum of 12 months after exam date.    EKG 12 lead    Result Date: 09/03/2023  Sinus tachycardia   Currently Active/Followed Hospital Problems:     Active Hospital Problems    Diagnosis     *!*Weakness     Polycythemia     Hyperkalemia     Hypertension     HLD (hyperlipidemia)     Chronic pain     Tobacco use     COPD (chronic obstructive pulmonary disease)     Bipolar I disorder     Generalized anxiety disorder      Assessment and Plan     YANISA REHAGEN is a 77 y.o. female     77 y.o. female with past medical history significant for chronic pain, COPD, bipolar disorder, Hypertension, HLD.  Presenting to [name] with  generalized weakness.      >Weakness   -differentials include deconditioning, dehydration, nerve entrapment, less likely spinal cord compression, stroke or myopathy  -pt reports generalized weakness, left leg>right on physical exam, recent MRI of the head for weakness on 11/13 with no acute abnormality, no other focal symptoms   -CPK 271  -Pending PT eval, may need to be discharged to skilled nursing facility for rehab, given failed home plan    > Syncopal episodes  Patient reports "passing out at home" and she is scared to go home  Will place her on telemetry  Will recommend 14-day Holter monitor on discharge    >Polycythemia   -HGB 16.2, HCT 51   -this has been longstanding so less likely due to dehydration   -CT of the abdomen and pelvis pending to evaluate for adrenal mass/renal mass (also with labile blood pressures)  -JAK2 mutation pending   -hydrating as well, continue to monitor       >Hypertension   -recently labile blood pressure, renal ultrasound is  notable for elevated velocity in the left renal artery due to hemodynamically significant stenosis.  Will need vascular surgery referral  -continue lisinopril 40 mg daily, Imdur 30 mg daily, amlodipine 2.5 mg daily and metoprolol succinate 25 mg daily      >Chronic pain   -continue Lyrica 100 mg TID, and prn oxycodone 10 mg      >Tobacco use   -declines nicotine patch at this time      >HLD   -Home ezetimibe nonformulary, restart at discharge     >COPD   -PRN nebs available     >Bipolar disorder/generalized anxiety   -Continue home venlafexine 37.5mg  daily and xanax 0.5mg  prn     Fluids & Electrolytes: None  Nutrition: Diet regular  Mobility Plan/Ability:  Up with assistance. PT/OT pending  DVT prophylaxis: Lovenox     Code Status: DNR/DNI     CODE: DNR/DNI     DISPOSITION: Continue to monitor for overall clinical improvement.    Total care time > 50 minutes today, > 50% spent counseling and coordinating care. Case discussed with APP, RN, case management,  pharmacy.    Signed:   Lula Olszewski, MD   09/04/2023  2:56 PM

## 2023-09-04 NOTE — Progress Notes (Signed)
09/04/23 0740   UM Patient Class Review   Patient Class Review Observation   Level of Care Review  Acute     Patient Class effective 09/03/23

## 2023-09-04 NOTE — Plan of Care (Signed)
Problem: Safety  Goal: Patient will remain free of falls  Outcome: Progressing towards goal     Problem: Pain/Comfort  Goal: Patient's pain or discomfort is manageable  Outcome: Progressing towards goal     Problem: Psychosocial  Goal: Demonstrates ability to cope with illness  Outcome: Progressing towards goal     Problem: Cognitive function  Goal: Lines and tethers will be removed as appropriate  Outcome: Progressing towards goal  Goal: Patient maintains appropriate nutritional intake  Outcome: Progressing towards goal  Goal: Ambulation and mobility will be maintained  Outcome: Progressing towards goal  Goal: Retention of urine and constipation will be managed  Outcome: Progressing towards goal     Problem: Risk for Impaired Sleep/Wake Cycle  Goal: The patient will maintain an adequate sleep/wake cycle  Outcome: Progressing towards goal     Problem: Bowel Elimination  Goal: Elimination pattern is normal or improving  Outcome: Progressing towards goal

## 2023-09-04 NOTE — Progress Notes (Signed)
Physical Therapy Evaluation      Discharge recommendation:  Skilled Nursing Facility Rehab  Equipment recommendations upon discharge: To be determined  Mobility recommendations for nursing while in hospital: CGAx1, mod 1A for stairs    Patient Name: Kylie Parker  Date of Birth: February 10, 1946  MRN#: [id number]  Admission Date: 09/03/2023  Date of Service: 09/04/2023    History of present illness:   Kylie Parker is a 77 y.o. female who presented to [name] with Weakness. Patient was referred to Physical Therapy for functional mobility assessment and discharge planning.    Prior physical therapy history for above complaint within the last year: yes, was just in the hospital    Admitting Diagnosis:   Weakness       Past Medical History:   Diagnosis Date    ADHD (attention deficit hyperactivity disorder)     Angina     Arthritis     Bipolar 1 disorder     Chest pain, unspecified     pressure    COPD (chronic obstructive pulmonary disease)     Fibromyalgia     High blood pressure     Hypertension     Shortness of breath      Past Surgical History:   Procedure Laterality Date    COLON SURGERY      abcess    HAND SURGERY Bilateral     trigger finger bilateral    TONSILLECTOMY         Hospital Course:  US doppler renal artery and vein    Result Date: 09/04/2023  Elevated velocity demonstrated proximally in the left renal artery which is likely secondary to a hemodynamically significant stenosis. In the appropriate clinical setting consider referral to vascular and interventional radiology clinic for further evaluation. Mildly increased cortical reflectivity in both kidneys which can be seen in medical renal disease. END OF IMPRESSION       [facility] submits this DICOM format image data and final report to the [facility], an independent secure electronic health information exchange, on a reciprocally searchable basis (with patient authorization) for a minimum of 12 months after exam date.    MR lumbar spine without  contrast    Result Date: 09/04/2023  Multilevel degenerative changes, most pronounced at L4-L5 where there is moderate to severe spinal canal stenosis with crowding of the bilateral subarticular zones. END OF IMPRESSION       [facility] submits this DICOM format image data and final report to the [facility], an independent secure electronic health information exchange, on a reciprocally searchable basis (with patient authorization) for a minimum of 12 months after exam date.    CT abdomen and pelvis with IV contrast    Result Date: 09/03/2023  Again seen is a 1.9 x 1.3 cm left adrenal lesion which similar in size when compared to the prior CT abdomen pelvis from 12/23/2014, however with interval development of internal calcifications. This finding is indeterminate and may represent a benign adenoma with chronic calcifications versus a developing adrenal cortical carcinoma. A follow-up CT or MRI adrenal protocol is recommended for further evaluation Subtle 1.1 cm isoattenuating lesion within the periampullary region with mild dilation of the main pancreatic duct and common bile duct, and mild intrahepatic biliary duct dilation. This is an indeterminate and a neoplasm cannot be totally excluded. An MRI/MRCP with contrast is recommended for further evaluation. Severe atherosclerotic plaque within the abdominal aorta and its branch vessels. Eccentric thrombosis/ulcerated noncalcified plaques increase  the patient's risk of embolization. Infrarenal aortic aneurysm. . Partially visualized large heterogeneous fluid collections in the bilateral adductor musculature recommend clinical correlation and/or dedicated CT or MRI for further evaluation 6 mm pleural-based left lower lobe pulmonary nodule. Additional tree-in-bud nodularity in the left lung base as above. If no prior imaging available for comparison and the patient is high risk, consider dedicated CT of the chest Moderate pulmonary emphysema Findings to be  communicated to the provider through the imaging support team, 6:56 p.m. END OF IMPRESSION I have personally reviewed the images and the Resident's/Fellow's interpretation and agree with or edited the findings.       [facility] submits this DICOM format image data and final report to the [facility], an independent secure electronic health information exchange, on a reciprocally searchable basis (with patient authorization) for a minimum of 12 months after exam date.    EKG 12 lead    Result Date: 09/03/2023  Sinus tachycardia       SUBJECTIVE  Patient reports she is struggling to take care of herself at home and do stairs.    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 No     PPE worn by Clinical research associate [facility]     Fall Precautions General falls precautions        Current Pain Assessment    Pain Assessment / Reassessment Assessment     Pain Scale 0-10 (Numeric Scale for Pain Intensity)      0-10 Scale 0     Faces pain rating 0     No Intervention/MAR Intervention(s) No Intervention required        Vision     Current Vision Adequate for PT session        Cognition    Cognition Tested     Arousal/Alertness  Appropriate responses to stimuli     Orientation Alert and oriented x3     Ability to Follow Instructions Follows all commands and directions without difficulty        UE Assessment    Assessment Focus Strength        LUE Strength    Overall Strength WFL assessed within functional activities        RUE Strength    Overall Strength WFL assessed within functional activities  LE Assessment    Assessment Focus Strength        LLE Strength    Overall Strength Deficits        RLE Strength    Overall Strength Deficits        Sensation    Sensation No apparent deficit        Bed Mobility    Bed mobility Tested     Supine to Sit Independent     Sit to Supine Independent        Transfers    Transfers Tested     Sit to Stand Stand by assistance     Stand to sit Stand by assistance     Transfer Assistive Device none        Mobility    Mobility Tested     Gait Pattern Decreased cadence;Decreased R step height;Decreased L step height     Ambulation Assist Contact guard     Ambulation Distance (Feet) 250     Ambulation Assistive Device None     Stairs Assistance Moderate     Stair Management Technique One rail;Step to pattern;With gait belt   with hand held for other hand    Number of Stairs 5     Additional comments unsteady, slow, and required hand held assist and one rail        Family/Caregiver Training`    Patient/Family/Caregiver training Yes     Patient training Role of physical therapy in hospital and plan for evaluation and follow up;Discharge planning;PT plan of care after evaluation        Balance    Balance Tested     Sitting - Static Independent     Sitting - Dynamic Independent     Standing - Static Contact guard     Standing - Dynamic Min assist        Functional Outcome Measures    Functional Outcome Measures Yes        PT AM-PAC Mobility    Turning over in bed? None: Modified Independence/independent     Moving from lying on back to sitting on the side of the bed? None: Modified Independence/independent      Moving to and from a bed to a chair? A Little: Minimum/Contact Guard Assist/Supervision     Sitting down on and standing up from a chair with arms? A Little: Minimum/Contact Guard Assist/Supervision     Need to walk in hospital room? A Little: Minimum/Contact Guard Assist/Supervision     Climbing 3 - 5 steps with a railing? A Lot: Maximum/Moderate Assist     Total Raw Score 19     Standardized Score - Calculated 45.44     % Functional Impairment - Calculated 42%        Assessment    Brief Assessment Appropriate for skilled therapy     Problem List Impaired LE strength;Impaired endurance;Impaired ambulation;Impaired functional status;Impaired functional mobility;Impaired stair navigation;Impaired balance     Patient / Family Goal to feel better     Overall Assessment Patient required moderate assistance when doing steps with therapist today and did demonstrate balance deficits. At this time patient would benefit from SNF rehab to improve upon her ability to perform stairs and work towards becoming more independent at home.        Plan/Recommendation    PT Treatment Interventions Restorative PT;Assess functional mobility;D/C planning     PT Frequency 2-4x/wk     PT Mobility Recommendations CGAx1, mod 1A for stairs  PT Referral Recommendations OT     PT Discharge Recommendations Skilled Nursing Facility Rehab     PT Discharge Equipment Recommended To be determined     PT Assessment/Recommendations Reviewed With: Nursing;Occupational Therapy;Patient;Physician;Care coordinator        Time Calculation    PT Timed Codes 0     PT Untimed Codes 1     PT Unbilled Time 0     PT Total Treatment 1        Plan and Onset date    Plan of Care Date 09/04/23     Onset Date 09/03/23     Treatment Start Date 09/04/23                       ASSESSMENT:  Kylie Parker is a 77 y.o. female who has been admitted to the hospital with Weakness.  She has been referred to PT for Patient/Family/Caregiver Education and Training and Discharge  Planning Recommendations. Patient presents to PT currently requiring CGAx1, mod 1A for stairs.  Patient will benefit from skilled PT services to address above stated impairments with above stated interventions.     Personal factors affecting treatment/recovery:  Lives alone  Recent hospital admission  Needs assistance for mobility    Comorbidities affecting treatment/recovery for this admission (*see bolded below):  Past Medical History:   Diagnosis Date    ADHD (attention deficit hyperactivity disorder)     Angina     Arthritis     Bipolar 1 disorder     Chest pain, unspecified     pressure    COPD (chronic obstructive pulmonary disease)     Fibromyalgia     High blood pressure     Hypertension     Shortness of breath      Clinical presentation:  evolving    Patient complexity:    moderate level as indicated by above stability of condition, personal factors, environmental factors and comorbidities in addition to their impairments found on physical exam.    PT Prognosis:good    Patient / Caregiver Understanding: good    Long term goals: Patient will be able to perform all ADL's and be able to negotiate her four steps at home >8inches in four weeks so she can be independent at home.     PT Care Plan Notes       No notes found.              PLAN   (see flow sheet above for components of plan)    Plan of Care     The physician's co-signature on this note indicates that they have reviewed this evaluation and agree with the documented goals and plan of care.    Thank you for the referral.    Herbert Deaner, PT,DPT

## 2023-09-04 NOTE — Progress Notes (Signed)
Occupational Therapy Evaluation    Patient: Kylie Parker, 11/07/1945  Does OT need to see patient PRIOR to D/C:    Discharge recommendation:   Prior Living Environment, Home OT   Equipment recommendations:     Hospital Stay Recommendations for nursing: patient independent in room, supervision for hospital safety in bathroom    Initial Eval Completed. Kylie Parker is a 77 y.o.female who presents with Weakness. Patient was able to complete bed mobility with independence. They completed sit<>stand transfer with independence. Patient completed functional mobility with independence. They participated in LB dressing with independence and no assistive device and toileting with independence. Patient will benefit from skilled occupational therapy services during inpatient stay to maintain and promote functional independence. Therapist will recommend prior living environment as patient demonstrates adequate ADLs for discharge.      Past Medical History:   Diagnosis Date    ADHD (attention deficit hyperactivity disorder)     Angina     Arthritis     Bipolar 1 disorder     Chest pain, unspecified     pressure    COPD (chronic obstructive pulmonary disease)     Fibromyalgia     High blood pressure     Hypertension     Shortness of breath        Past Surgical History:   Procedure Laterality Date    COLON SURGERY      abcess    HAND SURGERY Bilateral     trigger finger bilateral    TONSILLECTOMY         *Bold Indicates co-morbidities affecting treatment and recovery    Occupational Profile relating to the present problem:  Lives alone  Advanced age    In addition to the PMH and surgical history listed above, the comorbidities affecting treatment/ recovery:   none noted    Performance deficits that result in activity limitations and/or performance restrictions:  decreased endurance    Modification of tasks needed or assistance with assessments necessary to enable completion of evaluation:  Patient independent    Patient  complexity:  low level as indicated by above personal factors, environmental factors and comorbidities in addition to their impairments found on physical exam.       OT Assessment (HH / URR) - 09/04/23 0802          OT Tracking  (HH / URR)    OT Tracking (HH / URR) OT Assigned     Type of Session evaluation with treatment     SW Request? 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);Primary school teacher        Prior Function  Prior Function Reported by patient     Level of Independence Independent with ADLs;Independent with ADL functional transfers;Independent ambulation   has been having trouble with negotiating stairs into home    Lives With Alone     Receives Help From Friend(s)     IADL Needs assistance        Current Pain Assessment    Pain Assessment / Reassessment Assessment     Pain Scale 0-10 (Numeric Scale for Pain Intensity)      0-10 Scale 0        Cognition    Cognition No deficit noted        UE Assessment    Additional Comments WFL during session        Bed Mobility    Supine to Sit Not tested     Sit to Supine Not tested        Functional Transfers    Sit to Stand Independent     Stand to Sit Independent     Toilet Transfers Supervision        DME Used Grab bar     Functional Mobility Supervision        Balance    Sitting - Static Independent      Sitting - Dynamic Independent     Standing - Static Independent     Standing - Dynamic Supervision     Standing Tolerance during Functional Task fair         ADL Assessment    Grooming Independent     LE Dressing Independent     Toileting Independent        Activity Tolerance    Endurance Tolerates 10 - 20 min activity with multiple rests        Functional Outcomes Measures    Functional Outcome Measures Yes        OT AM-PAC Self Care    Putting on and taking off regular lower body clothing? 4     Bathing (including washing, rinsing, drying)? 3     Toileting, which includes using toilet, bedpan, or urinal? 4     Putting on and taking off regular upper body clothing? 4     Taking care of personal grooming such as brushing teeth? 4     Eating Meals 4     Total Raw Score 23     CMS Score - Calculated 15.86%        Assessment    Assessment Impaired endurance;Impaired balance;Impaired self-care transfers        Plan    OT Frequency 1-2x/wk     Patient Will Benefit From ADL retraining;Functional transfer training;Endurance training        Recommendation    OT Discharge Recommendations Prior Living Environment;Home OT     OT Hospital Stay Recommendations patient independent in room, supervision for hospital safety in bathroom        Multidisciplinary Communication    Multidisciplinary Communication case management        Patient/Family/Caregiver Training    Patient/Family/Caregiver training Yes     Patient/Family/Caregiver training Role of occupational therapy in hospital and plan for evaluation;Discharge planning;OT plan of care after evaluation;Benefit of OOB activity and participation in ADLs while inpatient;Call don't fall, purpose of bed/chair alarm, and recommendations for nursing        Time Calculation    OT Timed Codes 0     OT Untimed Codes 12     OT Unbilled Time 0  OT Total Treatment 12        Plan and Onset date    Plan of Care Date 09/04/23     Onset Date 09/03/23     Treatment Start Date 09/04/23                       AMPAC Score Interpretation:  Adapted from Kylie Parker, et al Association of AM-PAC "6 Clicks" Basic Mobility and Daily Activity Scores with  discharge destination, 2021  AM-PAC Raw Score of > 19 (standardized score > 40.22 %) indicates a high likelihood of discharge to home     Adapted from [employer] al "Validity of h AM-PAC "6- Clicks" Inpatient Daily Activity and Basic Mobility Short Forms (2014)   AM-PAC raw score  >= 18 (standardized score >= 39, % impairment  < 47%) indicates a high probability of a discharge to home  AM-PAC raw score  >= 13 (standardized score >= 32.03, % impairment 63%) indicates pt is more likely to require IRF/SNF R  AM-PAC raw score <12 (standardized score < 30.6 % impairment > 66%) indicates pt is more likely to require Long Term Care      Thank you for your referral.   Kylie Parker, OT

## 2023-09-04 NOTE — Continuity of Care (Cosign Needed)
Pt is resting in bed, no pain at this time. Lovenox given ,tolerated well. IV in the left and right arm, saline locked, clean dry and intact. Tele and bed alarm remains at this time. No other concerns at this time.Call bell within reach, saftey and comfort maintained. frequent rounding continues.

## 2023-09-04 NOTE — Comprehensive Assessment (Signed)
Case Management in to assess patient for discharge planning. Patient is awake in bed. She was just discharged home from our facility on Friday, 09/01/23 with [facility] services for home Physical and Occupational therapy. [facility] referral was also sent for medication management, transportation and healthcare coordination. [place] [facility] Assessment ([facility]) set up with nurse and Clinician and appointments were set up for this Wednesday, 09/06/23 at 12:00 PM and Friday 09/08/23 at 1:00 PM to assess patient for home health aide services. Patient lives alone in a two story home, but does not use the second floor. She is independent with all Activities of Daily Living at baseline and does not use a device to walk with. She is stating that she is weak and unable to walk. She was evaluated by Physical and Occupational therapy today and Physical therapy is recommending rehab, due to her weakness and unsteadiness. She has 4 steps to get into the home. She does not have any family to assist. She is agreeable to rehab and would like to go to [facility] for her rehab. Referral sent to [facility] and waiting for acceptance.    Case Management will continue to follow patient during hospital stay for any other discharge needs.     09/04/23 1556   Demographics   Religious Beliefs None   [place] of Residence [place]   Marital status Divorced   Ethnicity/Race Caucasian   Primary Language English   Insurance Information Other (comment);Medicaid  [facility])   Primary Care Taker of? No one   Risk Factors   Risk Factors Low/High Health care utilization history;Functional impairment   Health Care Directives Screen (Also required for Hospice Patients)   *Has patient (or family) completed any of the following? (select all that apply) MOLST   MOLST available for inclusion in the chart? Yes   Does MOLST designate DNR;DNI   MOLST in chart? Yes   *Would they like to discuss any issues related to MOLST, DNR Order, HCP, Living Will, or POA? No    *Health Care Directive teaching done No   Living Situation   Lives With Alone   Home Geography   Type of Home 2 Story home   # Of Steps In Home 0  (does not use the second floor)   # Steps to Enter Home 4  (with railing)   Bedroom First floor   Bathroom First floor - full  (walkin with grab bar and shower chair)   Utilitites Working Yes   Alcohol Assessment (SBIRT)   > 0 ETOH drinks in past month N   SW Substance Abuse (not incl alcohol)   Other Substance Abuse Yes   SW Nicotine use   Nicotine frequency of use daily   Nicotine amount 1/2 PPD   Baseline ADL functioning   Transfers Independent   Ambulation Independent   Assistive Device none   Bathing/Grooming Independent   Meal Prep Independent   Able to feed self? Yes   Household maintenance/chores Independent   Able to drive? No  (uses [employer] or [facility])   Dialysis   Does Patient have Dialysis? 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  No   Is the patient presumed eligible for OPWDD services? No   Home Care Services   Do you currently have home care services? Yes  Home Care Agency Name [facility]  (Was set up when discharged home on 11/15, but has not started services yet)   Home Care Agency services delivered PT;OT   Current Home Equipment Shower chair;Walker (rolling);Reacher   SW Home oxygen   Do you have home oxygen? No   SW/CM Response to Interventions   Patient accepts interventions for Physical Activity   Patient declines interventions for Food;Housing;Transportation   Time Spent   Time Spent with Patient (min) 45      Social Determinants of Health        Food Insecurity: No Food Insecurity (09/04/2023)    Hunger Vital Sign     Worried About Running Out of Food in the Last Year: Never true     Ran Out of Food in the Last Year: Never true    Date of last entry: 09/04/2023    Housing  Stability: Low Risk  (09/04/2023)    Housing Stability Vital Sign     Unable to Pay for Housing in the Last Year: No     Number of Times Moved in the Last Year: 0     Homeless in the Last Year: No    Date of last entry: 09/04/2023   Utilities: Not At Risk (09/03/2023)    AHC Utilities     Threatened with loss of utilities: No    Date of last entry: 09/03/2023   Transportation Needs: No Transportation Needs (09/04/2023)    PRAPARE - Transportation     Lack of Transportation (Medical): No     Lack of Transportation (Non-Medical): No    Date of last entry: 09/04/2023            09/04/2023     3:56 PM   Interventions Accepted   Patient accepts interventions for Physical Activity         09/04/2023     3:56 PM   Interventions Declined   Patient declines interventions for [employer]

## 2023-09-05 ENCOUNTER — Observation Stay
Admission: RE | Admit: 2023-09-05 | Discharge: 2023-09-05 | Disposition: A | Discharge status: Home / Self Care | Payer: Medicare (Managed Care) | Source: Ambulatory Visit | Attending: Internal Medicine

## 2023-09-05 DIAGNOSIS — R55 Syncope and collapse: Secondary | ICD-10-CM

## 2023-09-05 LAB — BASIC METABOLIC PANEL
Anion Gap: 8 (ref 7–16)
CO2: 24 mmol/L (ref 20–28)
Calcium: 8.5 mg/dL — ABNORMAL LOW (ref 8.6–10.2)
Chloride: 108 mmol/L (ref 96–108)
Creatinine: 0.48 mg/dL — ABNORMAL LOW (ref 0.51–0.95)
Glucose: 91 mg/dL (ref 60–99)
Lab: 12 mg/dL (ref 6–20)
Potassium: 3.6 mmol/L (ref 3.3–4.6)
Sodium: 140 mmol/L (ref 133–145)
eGFR BY CREAT: 98 *

## 2023-09-05 LAB — CBC AND DIFFERENTIAL
Baso # K/uL: 0 10*3/uL (ref 0.0–0.2)
Eos # K/uL: 0.1 10*3/uL (ref 0.0–0.5)
Hematocrit: 42 % (ref 34–49)
Hemoglobin: 12.9 g/dL (ref 11.2–16.0)
IMM Granulocytes #: 0 10*3/uL (ref 0.0–0.0)
IMM Granulocytes: 0.7 %
Lymph # K/uL: 1.3 10*3/uL (ref 1.0–5.0)
MCV: 88 fL (ref 75–100)
Mono # K/uL: 0.5 10*3/uL (ref 0.1–1.0)
Neut # K/uL: 1.1 10*3/uL — ABNORMAL LOW (ref 1.5–6.5)
Nucl RBC # K/uL: 0 10*3/uL (ref 0.0–0.0)
Nucl RBC %: 0 /100{WBCs} (ref 0.0–0.2)
Platelets: 158 10*3/uL (ref 150–450)
RBC: 4.7 MIL/uL (ref 4.0–5.5)
RDW: 17.5 % — ABNORMAL HIGH (ref 0.0–15.0)
Seg Neut %: 35.8 %
WBC: 3 10*3/uL — ABNORMAL LOW (ref 3.5–11.0)

## 2023-09-05 NOTE — Discharge Summary (Deleted)
Name: Kylie Parker MRN: [id number] DOB: 11/02/1945     Admit Date: 09/03/2023   Date of Discharge: 09/05/2023     Patient was accepted for discharge to   Home or Self Care [1]       Discharge Attending Physician: Lula Olszewski, MD      Hospitalization Summary    Concise Narrative: 77 y.o. female with past medical history significant for chronic pain, COPD, bipolar disorder, Hypertension, HLD.      [facility]ing [name] with generalized weakness. This is the third visit in the past 3 weeks for weakness and difficulty ambulating. She has had recurrent falls and was hospitalized at our facility from 11/12 to 11/15  after a fall with rhabdomyolysis and concern for TIA/stroke, due to gait preference to the right. She was treated with IV fluids and underwent MRI of the head on 11/13 which showed no acute abnormality. She was referred to neurosurgery outpatient for 4mm saccular aneurysm. She was evaluated by physical therapy and discharged with home services.   Patient return to the hospital a day later reporting increased weakness and concern for falling.  She lives alone and for this reason she was admitted on observation, physical therapy saw and evaluated her.  Physical therapy recommended skilled nursing facility with rehab, which patient declined today.  The cause of her falls/weakness is unclear at this point.  However imaging here did reveal renal artery stenosis and she is noted to have fluctuating blood pressure unclear if this is contributing.  There is also possibility of heart arrhythmias?  So patient is being discharged on a 14-day E patch.  She has been set up with [facility] services to receive physical therapy at home.  She is in agreement with the above.  She is also aware of the increased likelihood of readmission for weakness.  Her chronic medical conditions are stable and no changes were made to her home medications.  She is stable for discharge home with close outpatient follow-up with her PCP, she  will need referral to vascular surgery to evaluate her for renal artery stenosis.                  Ultrasound Results:   Elevated velocity demonstrated proximally in the left renal artery which is likely secondary to a hemodynamically significant stenosis.  In the appropriate clinical setting consider referral to vascular and interventional radiology clinic for further evaluation.  Mildly increased cortical reflectivity in both kidneys which can be seen in medical renal disease.       Other Imaging Results: ECHO   Technically limited study.  Limited diagnostic imaging.   Small LV volumes.  Hyperdynamic LVEF without regional wall motion abnormalities.   Aortic valve and mitral annular sclerosis without significant stenosis.   Grossly normal RV size and function.   Estimated mild to moderately elevated RV systolic pressure.  No prior study available for comparison.                      Post hospitalization To Do List for PCP: Vascular surgery referral for Renal Artery stenosis.   Spine/Neurosurgery for severe spinal canal stenosis            Signed: Lula Olszewski, MD  On: 09/05/2023  at: 2:02 PM

## 2023-09-05 NOTE — Continuity of Care (Signed)
Telephone call made to Primary Care Physician office Case Manager, Pam to provide an update on patient. Message left to return my call. Waiting for return call.

## 2023-09-05 NOTE — Progress Notes (Signed)
Patient was given discharge information, will call [facility] for appointment. IV was removed, waiting for cab that was called by Mrs. Holly.

## 2023-09-05 NOTE — Discharge Instructions (Addendum)
Nurse Raynelle Fanning will be arriving to your home on 09/06/23 at 12:00 PM  Clinician Gar Gibbon will be arriving to your home on 09/08/23 at 1:00 PM    [facility] when you get home to schedule your home Physical and Occupational therapy appointment. [phone]      Date of admission: 09/03/2023  Date of discharge: 09/05/2023    Admission Diagnosis:  Associated Diagnoses:   Active Hospital Problems    Diagnosis     Polycythemia     Hypertension     HLD (hyperlipidemia)     Chronic pain     COPD (chronic obstructive pulmonary disease)     Bipolar I disorder          Hospital Course:  You were admitted to [facility] for weakness and episode of reported "passing out".  Blood work and imaging here so far has been negative and we cannot find a cause for your passing out episodes at home.  We are discharging you with a heart monitor which will monitor your heart activity for the next 2 weeks to see if we can find the explanation for your passing out episodes.  We are also sending physical therapist and [facility] to your home to help strengthen you to prevent future falls.    You do not have any new prescriptions and there were no changes made to your medications.    Issues addressed during your stay:      Education: see attached    Take your medications as prescribed.    Follow up with your PCP within 7-10 days of discharge.  Notify your PCP for further medication refills.  Please sign up with  of [employer] app to have access to your medical information.    Follow up labs:    Discharge Activity: Activity as tolerated       Discharge Diet: Regular Diet    Complications:  Call your PCP's office if you are experiencing any of the following: chest pain, shortness of breath, acute abdominal pain, no BM>4 days, unable to void, fever/chills, any prolonged bleeding if on blood thinner medication

## 2023-09-05 NOTE — Discharge Summary (Signed)
Name: Kylie Parker MRN: [id number] DOB: Feb 20, 1946     Admit Date: 09/03/2023   Date of Discharge: 09/05/2023     Patient was accepted for discharge to   Home or Self Care [1]       Discharge Attending Physician: Lula Olszewski, MD      Hospitalization Summary    Concise Narrative: 77 y.o. female with past medical history significant for chronic pain, COPD, bipolar disorder, Hypertension, HLD.      Presenting to [name] with generalized weakness. This is the third visit in the past 3 weeks for weakness and difficulty ambulating. She has had recurrent falls and was hospitalized at our facility from 11/12 to 11/15  after a fall with rhabdomyolysis and concern for TIA/stroke, due to gait preference to the right. She was treated with IV fluids and underwent MRI of the head on 11/13 which showed no acute abnormality. She was referred to neurosurgery outpatient for 4mm saccular aneurysm. She was evaluated by physical therapy and discharged with home services.   Patient return to the hospital a day later reporting increased weakness and concern for falling.  She lives alone and for this reason she was admitted on observation, physical therapy saw and evaluated her.  Physical therapy recommended skilled nursing facility with rehab, which patient declined today.  The cause of her falls/weakness is unclear at this point.  However imaging here did reveal renal artery stenosis and she is noted to have fluctuating blood pressure unclear if this is contributing.  There is also possibility of heart arrhythmias?  So patient is being discharged on a 14-day E patch.  She has been set up with [facility] services to receive physical therapy at home.  She is in agreement with the above.  She is also aware of the increased likelihood of readmission for weakness.  Her chronic medical conditions are stable and no changes were made to her home medications.  She is stable for discharge home with close outpatient follow-up with her PCP, she  will need referral to vascular surgery to evaluate her for renal artery stenosis.                  Ultrasound Results:   Elevated velocity demonstrated proximally in the left renal artery which is likely secondary to a hemodynamically significant stenosis.  In the appropriate clinical setting consider referral to vascular and interventional radiology clinic for further evaluation.  Mildly increased cortical reflectivity in both kidneys which can be seen in medical renal disease.       Other Imaging Results: ECHO   Technically limited study.  Limited diagnostic imaging.   Small LV volumes.  Hyperdynamic LVEF without regional wall motion abnormalities.   Aortic valve and mitral annular sclerosis without significant stenosis.   Grossly normal RV size and function.   Estimated mild to moderately elevated RV systolic pressure.  No prior study available for comparison.                      Post hospitalization To Do List for PCP: Vascular surgery referral for Renal Artery stenosis.   Spine/Neurosurgery for severe spinal canal stenosis            Signed: Lula Olszewski, MD  On: 09/05/2023  at: 1:59 PM

## 2023-09-05 NOTE — Progress Notes (Signed)
OT Assessment (HH / URR) - 09/05/23 0907          OT Tracking  (HH / URR)    OT Tracking (HH / URR) OT Assigned     Type of Session follow up/treatment        Precautions    Precautions used Yes     Fall Precautions General falls precautions     Writer wearing PPE including Gloves;Mask     Activity Order Activity as tolerated        Home Living (Prior to Admission)    Prior Living Situation --   see evaluation       Prior Function    Prior Function --   see evaluation       Vision     Current Vision Wears corrective lenses        Cognition    Cognition No deficit noted        UE Assessment    UE Assessment --   WFL       Bed Mobility    Rolling for Self Care Supervision     Supine to Sit Supervision     Sit to Supine Supervision     Supine to Long Sit Supervision     Long Sit to Supine Supervision        Functional Transfers    Stand pivot transfer Contact Guard     Sit to Stand Supervision     Stand to Sit Supervision     Toilet Transfers Contact guard     Shower Transfers Contact guard     Tub Transfers Contact guard     Functional Mobility Contact Guard        Balance    Sitting - Static Independent      Sitting - Dynamic Supervision     Standing - Static Contact Guard     Standing - Dynamic Contact Guard        ADL Assessment    Eating Set up     Oral Care Set up     Grooming Set up     UE Dressing Supervision     LE Dressing Supervision     Bathing Contact guard     Toileting Contact guard        IADL Assessment    IADL Needs assistance        Activity Tolerance    Endurance Tolerates 10 - 20 min activity with multiple rests        Functional Outcomes Measures    Functional Outcome Measures Yes        OT AM-PAC Self Care    Putting on and taking off regular lower body clothing? 3     Bathing (including washing, rinsing, drying)? 3     Toileting, which includes using toilet, bedpan, or urinal? 3     Putting on and taking off regular upper body clothing? 3     Taking care of personal grooming such as brushing  teeth? 3     Eating Meals 4     Total Raw Score 19     CMS Score - Calculated 42.80%        Assessment    Assessment Impaired ADL status;Impaired UE ROM;Impaired UE strength;Impaired Safe judgement during ADL;Impaired balance;Impaired endurance;Impaired self-care transfers;Impaired instrumental ADL's        OT Treatment Assessment    Assessment D/C Rehab OT        Plan    OT  Frequency 2-3x/wk     Patient Will Benefit From ADL retraining;Perceptual re-education;Functional transfer training;UE strengthening/ROM;Endurance training;IADL training;Exercises        Recommendation    OT Discharge Recommendations Skilled Nursing Facility Rehab     OT Discharge Equipment Recommended --   To be determined    OT Hospital Stay Recommendations CGA with transfers, assist with ADL tasks        Multidisciplinary Communication    Multidisciplinary Communication PTA Carlena        Patient/Family/Caregiver Training    Patient/Family/Caregiver training Yes     Patient/Family/Caregiver training Role of occupational therapy in hospital and plan for evaluation;Discharge planning;OT plan of care after evaluation;Call don't fall, purpose of bed/chair alarm, and recommendations for nursing        Time Calculation    OT Timed Codes 1                   Patient supine in bed upon arrival. She was able to transfer to EOB  and go from seated to standing with SBA. Upon standing patient transferred to bathroom with CGA and completed toileting tasks. Clothing management, hygiene, and transfer with CGA. Patient also demonstrates inappropriate use of furniture for stabilizing herself. Given repeated falls at home and impaired safety awareness, would recommend short term rehab at this time.

## 2023-09-05 NOTE — Continuity of Care (Signed)
[facility] has declined patient due to she does not meet skilled need criteria.    In to speak to inform patient. She is now declining rehab and would like to go home.     Patient is set up with home Physical and Occupational therapy with the [facility].    Patient is agreeable for a [facility] referral to be sent. [facility] referral sent.

## 2023-09-05 NOTE — Telephone Encounter (Signed)
Called Gauley Bridge at [facility]. Patient was agreeable to rehab yesterday but [facility] denied her admission for rehab because she did not meet criteria for rehab stay (too independent). She was discharged to home today with [facility] referral. Has Nurse evaluation for home care aide later this week. Has PT scheduled for 11/22.   Kylie Parker did a [facility] referral for additional care management for the patient.  Has appointment scheduled in this office for 11/21; Kylie Parker is asking for our office to schedule travel.  Patient has new answering machine but she does not know how to hook it up. Kylie Parker is hoping that the [facility] nurse will get it hooked up when she goes to the house.  Access Associate Eileen Stanford from this office arranged travel for the patient.

## 2023-09-05 NOTE — Progress Notes (Signed)
PT Adult Assessment - 09/05/23 0907          Prior Living     Prior Living Situation Obtained via chart        Prior Function Level    Prior Function Level Obtained via chart        PT Tracking    PT TRACKING PT Assigned     Type of Session follow up/treatment        Precautions/Observations    Precautions used Yes     Was patient wearing a mask? No     PPE worn by Clinical research associate [facility]     Fall Precautions General falls precautions        Bed Mobility    Bed mobility Tested     Supine to Sit Supervision     Sit to Supine Supervision        Transfers    Transfers Tested     Sit to Stand Stand by assistance     Stand to sit Stand by assistance     Transfer Assistive Device none     Additional comments decreased safety awareness with transfers        Mobility    Mobility Tested     Gait Pattern Decreased cadence;Decreased R step height;Decreased L step height     Ambulation Assist Contact guard     Ambulation Distance (Feet) 15     Ambulation Assistive Device None     Additional comments unsteady and slow with decreased safety awareness.        Family/Caregiver Training`    Patient/Family/Caregiver training Yes     Patient training Role of physical therapy in hospital and plan for evaluation and follow up;Discharge planning;Benefits of oob activity and mobility during hospitalization, including frequency of ambulation and change of position;Recommendation to increase oob activity and ambulation with nursing while in hospital;Call don't fall, purpose of bed/chair alarm, and recommendation for nursing assistance during all oob mobility        Balance    Balance Tested     Sitting - Static Supervision     Sitting - Dynamic Supervision     Standing - Static Contact guard     Standing - Dynamic Min assist        Functional Outcome Measures    Functional Outcome Measures Yes        PT AM-PAC Mobility    Turning over in bed? None: Modified Independence/independent     Moving from lying on back to sitting on the side of the  bed? A Little: Minimum/Contact Guard Assist/Supervision     Moving to and from a bed to a chair? A Little: Minimum/Contact Guard Assist/Supervision     Sitting down on and standing up from a chair with arms? A Little: Minimum/Contact Guard Assist/Supervision     Need to walk in hospital room? A Little: Minimum/Contact Guard Assist/Supervision     Climbing 3 - 5 steps with a railing? A Lot: Maximum/Moderate Assist     Total Raw Score 18     Standardized Score - Calculated 43.63     % Functional Impairment - Calculated 47%%        Assessment    Brief Assessment Remains appropriate for skilled therapy     Problem List Impaired LE strength;Impaired endurance;Impaired ambulation;Impaired functional status;Impaired functional mobility;Impaired stair navigation;Impaired balance     Patient / Family Goal to return home     Overall Assessment Pt continues to present below her baseline level  of function as she has declined since she was last admitted. Pt has decreased safety awareness with ambulation today and was reaching for non-stationary objects when transfering to steady herself. Her gait is shuffled with decreased step length. She required CG with ambulation today as she presented with imparied balance. Continued recommendation for SNF rehab as she is unsafe to return home.        Plan/Recommendation    PT Treatment Interventions Restorative PT;Assess functional mobility;D/C planning     PT Frequency 2-4x/wk     PT Mobility Recommendations CGx1     PT Referral Recommendations OT     PT Discharge Recommendations Skilled Nursing Facility Rehab     PT Discharge Equipment Recommended To be determined     PT Assessment/Recommendations Reviewed With: Nursing;Occupational Therapy;Patient;Physician;Care coordinator        Time Calculation    PT Timed Codes 1     PT Untimed Codes 0     PT Unbilled Time 0     PT Total Treatment 1        Plan and Onset date    Plan of Care Date 09/04/23     Onset Date 09/03/23     Treatment Start  Date 09/04/23                   Upon arrival, pt resting in bed. Pt agreeable to PT/OT services this morning. Pt transferred from supine to sitting at EOB with supervision. Then transferred from sitting at EOB to standing with Cgx1. Pt ambulated to bathroom with decreased cadence and step length, and was slightly wobbly during ambulation, requiring Cgx1. Pt transferred to toilet with supervision. Once completed, pt attempted to transfer from sitting to standing by using the linen cart that was in the bathroom. Pt then ambulated back to her bed with decreased safety awareness and required Cgx1 as she is unsteady on her feet this morning while ambulating. Pt transferred back to supine on bed with Supervision. Made comfortable with call bell within reach.     Continued recommendation for SNF rehab to regain her strength before returning home. Pt is currently actively refusing SNF rehab. Pt heavily educated on benefits for attending rehab to help improve her strength while addressing her medical issues.     Sofie Rower, PTA

## 2023-09-05 NOTE — Progress Notes (Signed)
Patient sitting at the edge of the bed having breakfast, alert and oriented, able to make needs known. Denies pain and others complaints at this moment. Room air, saturation 95%. IV in left hand and right AC both saline locked. Bed low, call bell and table with belongings within reach. Re inforced to call for help. BP 167/96, given metoprolol and norvasc as ordered.

## 2023-09-06 LAB — JAK2 GENE: JAK2 V617F: NEGATIVE

## 2023-09-06 LAB — JAK2 REVIEW

## 2023-09-06 NOTE — Telephone Encounter (Signed)
Call Information    What is the primary reason you are calling today?: Other                                                                                           OTHER REASON FOR CALL: Bonita Quin called stating Elim from [facility] told her at time of discharge that there would be someone at her home this morning at 8am to help patient. No one has showed up. Please advise.

## 2023-09-06 NOTE — Telephone Encounter (Signed)
Carlynn Purl nurse called, she will be going out to see her for home care aide evaluation at noon today. Called patient to let her know. She is getting mixed up about her appointments all coming at the same time. Advised her to let the nurse that's coming today know what her needs are.

## 2023-09-07 ENCOUNTER — Other Ambulatory Visit: Payer: Self-pay

## 2023-09-07 ENCOUNTER — Emergency Department
Admission: EM | Admit: 2023-09-07 | Discharge: 2023-09-07 | Disposition: A | Discharge status: Home / Self Care | Payer: Medicare (Managed Care) | Source: Ambulatory Visit | Attending: Urgent Care | Admitting: Urgent Care

## 2023-09-07 ENCOUNTER — Emergency Department: Payer: Medicare (Managed Care)

## 2023-09-07 DIAGNOSIS — N390 Urinary tract infection, site not specified: Secondary | ICD-10-CM | POA: Insufficient documentation

## 2023-09-07 DIAGNOSIS — R0981 Nasal congestion: Secondary | ICD-10-CM | POA: Insufficient documentation

## 2023-09-07 DIAGNOSIS — F17210 Nicotine dependence, cigarettes, uncomplicated: Secondary | ICD-10-CM | POA: Insufficient documentation

## 2023-09-07 DIAGNOSIS — R531 Weakness: Secondary | ICD-10-CM | POA: Insufficient documentation

## 2023-09-07 LAB — COMPREHENSIVE METABOLIC PANEL
ALT: 9 U/L (ref 0–35)
AST: 21 U/L (ref 0–35)
Albumin: 3.4 g/dL — ABNORMAL LOW (ref 3.5–5.2)
Alk Phos: 57 U/L (ref 35–105)
Anion Gap: 12 (ref 7–16)
Bilirubin,Total: 0.5 mg/dL (ref 0.0–1.2)
CO2: 26 mmol/L (ref 20–28)
Calcium: 10 mg/dL (ref 8.6–10.2)
Chloride: 100 mmol/L (ref 96–108)
Creatinine: 0.69 mg/dL (ref 0.51–0.95)
Glucose: 84 mg/dL (ref 60–99)
Lab: 8 mg/dL (ref 6–20)
Potassium: 3.8 mmol/L (ref 3.3–4.6)
Sodium: 138 mmol/L (ref 133–145)
Total Protein: 6.7 g/dL (ref 6.3–7.7)
eGFR BY CREAT: 89 *

## 2023-09-07 LAB — CBC AND DIFFERENTIAL
Baso # K/uL: 0.1 10*3/uL (ref 0.0–0.2)
Eos # K/uL: 0.1 10*3/uL (ref 0.0–0.5)
Hematocrit: 45 % (ref 34–49)
Hemoglobin: 14.2 g/dL (ref 11.2–16.0)
IMM Granulocytes #: 0 10*3/uL (ref 0.0–0.0)
IMM Granulocytes: 0.5 %
Lymph # K/uL: 1.5 10*3/uL (ref 1.0–5.0)
MCV: 87 fL (ref 75–100)
Mono # K/uL: 0.6 10*3/uL (ref 0.1–1.0)
Neut # K/uL: 1.9 10*3/uL (ref 1.5–6.5)
Nucl RBC # K/uL: 0 10*3/uL (ref 0.0–0.0)
Nucl RBC %: 0 /100{WBCs} (ref 0.0–0.2)
Platelets: 192 10*3/uL (ref 150–450)
RBC: 5.1 MIL/uL (ref 4.0–5.5)
RDW: 17.9 % — ABNORMAL HIGH (ref 0.0–15.0)
Seg Neut %: 45 %
WBC: 4.1 10*3/uL (ref 3.5–11.0)

## 2023-09-07 LAB — URINALYSIS WITH REFLEX TO MICROSCOPIC
Blood,UA: NEGATIVE
Glucose,UA: NEGATIVE
Nitrite,UA: NEGATIVE
Protein,UA: NEGATIVE
Specific Gravity,UA: 1.025 (ref 1.002–1.030)
pH,UA: 6 (ref 5.0–8.0)

## 2023-09-07 LAB — URINE MICROSCOPIC (IQ200): RBC,UA: NONE SEEN /[HPF] (ref 0–2)

## 2023-09-07 MED ORDER — NITROFURANTOIN MONOHYD MACRO 100 MG PO CAPS *I*
100.0000 mg | ORAL_CAPSULE | Freq: Once | ORAL | Status: AC
Start: 2023-09-07 — End: 2023-09-07
  Administered 2023-09-07: 100 mg via ORAL
  Filled 2023-09-07: qty 1

## 2023-09-07 MED ORDER — NITROFURANTOIN MONOHYD MACRO 100 MG PO CAPS *I*
100.0000 mg | ORAL_CAPSULE | Freq: Two times a day (BID) | ORAL | 0 refills | Status: AC
Start: 2023-09-07 — End: 2023-09-12

## 2023-09-07 NOTE — Telephone Encounter (Signed)
Provider S Blanford notified.

## 2023-09-07 NOTE — ED Triage Notes (Signed)
Pt BIBA from home with weakness for "months." Pt denies any recent falls. +chronic back pain. Pt endorses intermittent nausea. Pt denies vomiting. Denies chest pain.

## 2023-09-07 NOTE — ED Provider Notes (Signed)
History   No chief complaint on file.    77 year old female patient history of bipolar, COPD, fibromyalgia, hypertension, angina, ADHD presents to the emergency room with facial congestion head fullness today.  States she has had months of generalized weakness with 3 previous recent hospitalizations.  She was offered nursing home placement and declined states she wants to go home with [facility] services.  Patient was found to have renal artery stenosis with fluctuating blood pressures and it was unclear if this is the reason for her recent weakness and falls.  Patient states she has not fallen the last couple days.  Patient was supposed of had a visit with her primary care doctor today but her ride did not show up therefore missing the appointment.  She was talking to them on the phone today and found that she was having more difficulty getting around the house and getting to the bathroom.  Patient denies any falls today    Patient does admit to nasal congestion without any fevers no shortness of breath or chest pain          Medical/Surgical/Family History     Past Medical History:   Diagnosis Date    ADHD (attention deficit hyperactivity disorder)     Angina     Arthritis     Bipolar 1 disorder     Chest pain, unspecified     pressure    COPD (chronic obstructive pulmonary disease)     Fibromyalgia     High blood pressure     Hypertension     Shortness of breath         Patient Active Problem List   Diagnosis Code    Numbness in both hands R20.0    Carpal tunnel syndrome on both sides G56.03    Cervical radiculopathy at C6 M54.12    Fall W19.XXXA    COPD (chronic obstructive pulmonary disease) J44.9    Hypertension I10    Bipolar I disorder F31.9    Coronary arteriosclerosis I25.10    Fall, initial encounter W19.Lorne Skeens    Accelerated hypertension I10    HLD (hyperlipidemia) E78.5    Chronic pain G89.29    Polycythemia D75.1            Past Surgical History:   Procedure Laterality Date    COLON SURGERY      abcess     HAND SURGERY Bilateral     trigger finger bilateral    TONSILLECTOMY            Social History     Tobacco Use    Smoking status: Every Day     Packs/day: 1.00     Years: 50.00     Additional pack years: 0.00     Total pack years: 50.00     Types: Cigarettes    Smokeless tobacco: Never   Substance Use Topics    Alcohol use: No    Drug use: No             Review of Systems    Physical Exam     Triage Vitals  Triage Start: Start, (09/07/23 1618)  First Recorded BP: 105/71, Resp: 20, Temp: 35.9 C (96.6 F), Temp src: TEMPORAL Oxygen Therapy SpO2: 93 %, Oximetry Source: Rt Hand, O2 Device: None (Room air), Heart Rate: 97, (09/07/23 1615)  .  First Pain Reported  0-10 Scale: 8, Pain Location/Orientation: Back, (09/07/23 1615)       Physical Exam  Vitals and nursing  note reviewed.   Constitutional:       General: She is not in acute distress.     Appearance: She is not toxic-appearing.   HENT:      Head: Normocephalic and atraumatic.      Nose: Congestion present.      Mouth/Throat:      Mouth: Mucous membranes are moist.   Eyes:      Extraocular Movements: Extraocular movements intact.      Conjunctiva/sclera: Conjunctivae normal.   Cardiovascular:      Rate and Rhythm: Normal rate and regular rhythm.      Pulses: Normal pulses.      Heart sounds: Normal heart sounds.   Pulmonary:      Effort: Pulmonary effort is normal.      Breath sounds: Normal breath sounds.   Abdominal:      Palpations: Abdomen is soft.      Tenderness: There is no abdominal tenderness. There is no right CVA tenderness, left CVA tenderness, guarding or rebound.   Musculoskeletal:      Cervical back: Normal range of motion and neck supple.   Skin:     Capillary Refill: Capillary refill takes less than 2 seconds.   Neurological:      General: No focal deficit present.      Mental Status: She is alert and oriented to person, place, and time.   Psychiatric:         Behavior: Behavior normal.         Medical Decision Making     Assessment:  77 year old  female patient presents emergency room with nasal congestion and weakness for several months.  Was discharged from the hospital approximately 5 days ago and was sent home with a E patch.  Patient denies any shortness of breath chest pain today.  States she just feels weak.  Does have some increased urinary urgency.  Lower suprapubic tenderness without guarding or rebound.  Patient denies any nausea vomiting diarrhea    Differential diagnosis:  UTI, sinusitis, electrolyte abnormality, dehydration, anemia,    Plan:  Exam, nursing orders, CBC with differential, CMP, urinalysis, chest x-ray, oral Macrobid, discharge, prescription Macrobid, follow-up with PCP increase fluids    ED Course and Disposition:  77 year old female patient presents to the emergency room tonight with sinus congestion as well as some weakness earlier today but the weakness is not new she has had this for several months.  Urinalysis tonight does show some evidence of a possible urinary tract infection we will start her on Macrobid.  Nasal congestion more likely due to viral URI.  Patient is neurologically intact able to ambulate on her own without any difficulty.  Patient does live alone his [facility] services.  Encourage fluids and return if needed           [place], [place]            Eulah Pont, [place]  09/07/23 2041

## 2023-09-07 NOTE — Discharge Instructions (Signed)
Your labs were reassuring tonight.  Your urine showed that you most likely have a urinary tract infection that I am treating with Macrobid.  Please increase your fluids.  Your nasal congestion may be due to a viral URI.  You can take antihistamines or decongestants for this.  Please call your primary care doctor for follow-up

## 2023-09-08 LAB — EKG 12-LEAD
P: 63 deg
PR: 130 ms
QRS: 24 deg
QRSD: 92 ms
QT: 382 ms
QTc: 454 ms
Rate: 85 {beats}/min
T: 34 deg

## 2023-09-09 LAB — RENIN: Renin: 2.3 ng/mL/h

## 2023-09-09 LAB — METANEPHRINES,PLASMA
Metanephrine,Pl: <0.2 nmol/L (ref <0.50)
Normetanephrine,Pl: 2 nmol/L — ABNORMAL HIGH (ref <0.90)

## 2023-09-22 ENCOUNTER — Other Ambulatory Visit: Payer: Self-pay | Admitting: Physician Assistant

## 2023-09-22 DIAGNOSIS — I701 Atherosclerosis of renal artery: Secondary | ICD-10-CM

## 2023-09-29 ENCOUNTER — Telehealth: Payer: Self-pay

## 2023-09-29 NOTE — Telephone Encounter (Signed)
Called patient to schedule appt due to referral received from [facility].  Per patient she does not want to schedule with [place] at this time.  She has many other issue in trying to keep in her home that needs to be solved first.  She stated that she did let referring provider know that she did not want this appt and not to send any other referrals.  She will call us if she changes her mind at a later date.

## 2023-10-04 DIAGNOSIS — R55 Syncope and collapse: Secondary | ICD-10-CM

## 2023-10-25 ENCOUNTER — Ambulatory Visit: Payer: Medicare (Managed Care) | Admitting: Neurological Surgery

## 2023-10-31 ENCOUNTER — Telehealth: Payer: Self-pay

## 2023-10-31 NOTE — Telephone Encounter (Signed)
 Left message for patient to call back to reschedule NPV with Dr. Hessie Diener for 4 MM Aneurysm on MRI Brain.

## 2023-11-07 ENCOUNTER — Other Ambulatory Visit: Payer: Self-pay | Admitting: Physician Assistant

## 2023-11-07 DIAGNOSIS — E041 Nontoxic single thyroid nodule: Secondary | ICD-10-CM

## 2023-11-10 ENCOUNTER — Ambulatory Visit: Payer: Medicare (Managed Care)

## 2023-12-08 LAB — UNMAPPED LAB RESULTS
Basophil # (HT): 0 10 3/uL (ref 0.0–0.2)
Basophil % (HT): 1 % (ref 0–2)
Eosinophil # (HT): 0.1 10 3/uL (ref 0.0–0.5)
Eosinophil % (HT): 1 % (ref 0–7)
Hematocrit (HT): 52 % — ABNORMAL HIGH (ref 34–47)
Hemoglobin (HGB) (HT): 17 g/dL — ABNORMAL HIGH (ref 11.5–16.0)
Lymphocyte # (HT): 1.9 10 3/uL (ref 0.9–3.8)
Lymphocyte % (HT): 36 % (ref 17–44)
MCHC (HT): 33 g/dL (ref 32.0–36.0)
MCV (HT): 86.1 fL (ref 81.0–99.0)
Mean Corpuscular Hemoglobin (MCH) (HT): 28.4 pg (ref 26.0–34.0)
Monocyte # (HT): 0.5 10 3/uL (ref 0.2–1.0)
Monocyte % (HT): 9 % (ref 4–12)
Neutrophil # (HT): 2.8 10 3/uL (ref 1.5–7.7)
Platelets (HT): 209 10 3/uL (ref 150–450)
RBC (HT): 5.98 10 6/uL — ABNORMAL HIGH (ref 3.80–5.20)
RDW (HT): 15.3 % — ABNORMAL HIGH (ref 11.5–15.0)
Seg Neut % (HT): 53 % (ref 40–75)
WBC (HT): 5.3 10 3/uL (ref 4.0–10.8)

## 2023-12-21 NOTE — Progress Notes (Signed)
 Hematology New Patient Pathway     Kylie Parker  Age: 78 y.o.  DOB: 06-Aug-1946  MRN: [id number]  [address]  [place] [address]  Preferred language: ENGLISH      REGISTRATION:    Date of referral: 12/21/2023   Reason for Referral: Establish Care   Type of referral: External   Referring provider: PCP Lorie Phenix PA   Reason for referral: Polycythemia   Assessment:   12/07/23 PCP OV note  12/20/23 PCP Tele enc  06/10/19 Heme OV note CHART REVIEW     Labs: CARE EVERYWHERE, RESULTS REVIEW     Imaging: CHART REVIEW Image      Nurse Navigator encounter type: Chart Review      Current Outpatient Medications on File Prior to Visit:  amLODIPine 2.5 mg tablet, Take 1 tablet (2.5 mg total) by mouth daily., Disp: , Rfl:   venlafaxine 37.5 mg 24 hr capsule, Take 1 capsule (37.5 mg total) by mouth daily., Disp: , Rfl:   LYRICA 100 MG capsule, Take 1 capsule (100 mg total) by mouth 3 times daily., Disp: , Rfl:   oxyCODONE-acetaminophen (PERCOCET) 10-325 mg per tablet, Take 1 tablet by mouth every 8 hours as needed for Pain., Disp: , Rfl:   ondansetron (ZOFRAN) 8 mg tablet, Take 1 tablet (8 mg total) by mouth 3 times daily., Disp: , Rfl:   lisinopril (PRINIVIL,ZESTRIL) 40 mg tablet, , Disp: , Rfl:   metoprolol (TOPROL-XL) 25 MG 24 hr tablet, Take 1 tablet (25 mg total) by mouth daily. Do not crush or chew. May be divided., Disp: , Rfl:   isosorbide mononitrate (IMDUR) 30 MG 24 hr tablet, Take 1 tablet (30 mg total) by mouth daily., Disp: , Rfl:   albuterol-ipratropium (COMBIVENT) 18-103 MCG/ACT inhaler, Inhale 2 puffs into the lungs daily. Shake well before each use., Disp: , Rfl:   melatonin 3 MG, Take 1 tablet (3 mg total) by mouth nightly as needed., Disp: , Rfl:   Ascorbic Acid (VITAMIN C) 250 MG tablet, Take 1 tablet (250 mg total) by mouth daily., Disp: , Rfl:   alprazolam (XANAX) 0.5 MG tablet, Take 1 tablet (0.5 mg total) by mouth 2 times daily as needed., Disp: , Rfl:   ezetimibe (ZETIA) 10 MG tablet, Take 1 tablet (10 mg  total) by mouth daily., Disp: , Rfl:   aspirin 81 MG EC tablet, Take 1 tablet (81 mg total) by mouth daily., Disp: , Rfl:   Calcium Carbonate-Vitamin D (CALCIUM + D) 600-200 MG-UNIT per tablet, Take 2 tablets by mouth daily., Disp: , Rfl:   Multiple Vitamin (MULTIVITAMIN) per tablet, Take 1 tablet by mouth daily., Disp: , Rfl:     No current facility-administered medications on file prior to visit.      Past Medical History:  No date: ADHD (attention deficit hyperactivity disorder)  No date: Angina  No date: Arthritis  No date: Bipolar 1 disorder  No date: Chest pain, unspecified      Comment:  pressure  No date: COPD (chronic obstructive pulmonary disease)  No date: Fibromyalgia  No date: High blood pressure  No date: Hypertension  No date: Shortness of breath    Time spent on this encounter (in minutes): 8

## 2024-01-31 ENCOUNTER — Ambulatory Visit
Admission: RE | Admit: 2024-01-31 | Discharge: 2024-01-31 | Disposition: A | Discharge status: Home / Self Care | Payer: Medicare (Managed Care) | Source: Ambulatory Visit | Attending: Physician Assistant | Admitting: Physician Assistant

## 2024-01-31 DIAGNOSIS — E042 Nontoxic multinodular goiter: Secondary | ICD-10-CM

## 2024-01-31 DIAGNOSIS — E041 Nontoxic single thyroid nodule: Secondary | ICD-10-CM | POA: Insufficient documentation

## 2024-02-21 LAB — UNMAPPED LAB RESULTS
Basophil # (HT): 0 10 3/uL (ref 0.0–0.2)
Basophil % (HT): 1 % (ref 0–2)
Eosinophil # (HT): 0.1 10 3/uL (ref 0.0–0.5)
Eosinophil % (HT): 1 % (ref 0–7)
Hematocrit (HT): 47 % (ref 34–47)
Hemoglobin (HGB) (HT): 15.3 g/dL (ref 11.5–16.0)
Lymphocyte # (HT): 1.9 10 3/uL (ref 0.9–3.8)
Lymphocyte % (HT): 35 % (ref 17–44)
MCHC (HT): 32.6 g/dL (ref 32.0–36.0)
MCV (HT): 87 fL (ref 81.0–99.0)
Mean Corpuscular Hemoglobin (MCH) (HT): 28.3 pg (ref 26.0–34.0)
Monocyte # (HT): 0.5 10 3/uL (ref 0.2–1.0)
Monocyte % (HT): 10 % (ref 4–12)
Neutrophil # (HT): 2.9 10 3/uL (ref 1.5–7.7)
Platelets (HT): 228 10 3/uL (ref 150–450)
RBC (HT): 5.4 10 6/uL — ABNORMAL HIGH (ref 3.80–5.20)
RDW (HT): 14.9 % (ref 11.5–15.0)
Seg Neut % (HT): 53 % (ref 40–75)
WBC (HT): 5.5 10 3/uL (ref 4.0–10.8)

## 2024-02-23 ENCOUNTER — Other Ambulatory Visit: Payer: Self-pay | Admitting: Physician Assistant

## 2024-02-23 DIAGNOSIS — E041 Nontoxic single thyroid nodule: Secondary | ICD-10-CM

## 2024-02-27 ENCOUNTER — Other Ambulatory Visit: Payer: Self-pay | Admitting: Physician Assistant

## 2024-02-27 DIAGNOSIS — Z122 Encounter for screening for malignant neoplasm of respiratory organs: Secondary | ICD-10-CM

## 2024-02-27 DIAGNOSIS — E279 Disorder of adrenal gland, unspecified: Secondary | ICD-10-CM

## 2024-03-05 ENCOUNTER — Other Ambulatory Visit: Payer: Self-pay | Admitting: Physician Assistant

## 2024-03-05 DIAGNOSIS — E041 Nontoxic single thyroid nodule: Secondary | ICD-10-CM

## 2024-03-20 ENCOUNTER — Ambulatory Visit: Payer: Medicare (Managed Care)

## 2024-04-02 ENCOUNTER — Ambulatory Visit: Payer: Medicare (Managed Care)

## 2024-04-15 NOTE — H&P (Signed)
 [facility]  Hematology New Patient Consult Note    Reason For Consult:     Polycythemia  Referred by: Clotilda Socks, PA       HPI:      Kylie Parker is a 78 y.o. female with PMH significant for CAD, HTN, bipolar, COPD, chronic tobacco use presenting for initial evaluation of polycythemia requested by Clotilda Socks, PA.    Notably, patient was seen by hematology in August 2020 for polycythemia, with an elevated hematocrit of 52 at that time with a hemoglobin of 16.3.  At that point was thought that her polycythemia/erythrocytosis was secondary to her longstanding smoking.  She was recommended for CT lung cancer screening, as well as continued aspirin  use.  She had epo testing which was normal at 13, with JAK2 testing, which was negative both in 2020, as well as 2024.    Most recent labs are from 02/21/2024, showing WBC of 5.5, H&H of 15.3/47, with platelet count of 222.  Notably, back in February 2025, H&H was 17.0/52.    Currently, she states she is doing okay.  She endorses no new symptoms, and is unsure why she is here.  She denies any chest pain or shortness of breath.  She states she is been told she has too much blood in the past.  She notes that she is not really interested in having treatment of any condition that involves surgery.  She notes that she is due for mammography and colonoscopy, however does not not see the value if she is not willing to have surgery to fix the issues.    On review of systems, she endorses history of cataracts, but no other headaches or blurry vision.  She denies any new rashes, no erythromelalgia.  She does endorse that she has a chronic rash, for which she seen dermatology.  She denies any chest pain.  She endorses chronic lung issues for which she takes an inhaler.  She has active tobacco use disorder, and smokes 1 pack/day for the last 60+ years.  She endorses no regular alcohol use, no recreational drug use.  She lives home alone without any  family.  She does endorse a sister who had leukemia (possibly AL L) and she died relatively rapidly.  No other family history of cancer that she knows about, no family history of myeloma or lymphoma.  She denies any history of bleeding or clotting disorders. She endorses significant weight loss, but cannot tell me how much. She notes she has increased her eating and her weight has stabilized.     Voices no other concerns at this time.   Medical history:     Past Medical History:   Diagnosis Date    ADHD (attention deficit hyperactivity disorder)     Angina     Arthritis     Bipolar 1 disorder     Chest pain, unspecified     pressure    COPD (chronic obstructive pulmonary disease)     Fibromyalgia     High blood pressure     Hypertension     Shortness of breath         Past Surgical History:   Procedure Laterality Date    COLON SURGERY      abcess    HAND SURGERY Bilateral     trigger finger bilateral    TONSILLECTOMY          Prior to Admission medications   Medication Sig Start Date  End Date Taking? Authorizing Provider   amLODIPine  2.5 mg tablet Take 1 tablet (2.5 mg total) by mouth daily. 07/14/23   [provider]   venlafaxine  37.5 mg 24 hr capsule Take 1 capsule (37.5 mg total) by mouth daily.    [provider]   LYRICA  100 MG capsule Take 1 capsule (100 mg total) by mouth 3 times daily.    [provider]   oxyCODONE -acetaminophen  (PERCOCET) 10-325 mg per tablet Take 1 tablet by mouth every 8 hours as needed for Pain. 07/21/23   [provider]   ondansetron  (ZOFRAN ) 8 mg tablet Take 1 tablet (8 mg total) by mouth 3 times daily. 08/21/20   [provider]   lisinopril  (PRINIVIL ,ZESTRIL ) 40 mg tablet  08/21/20   [provider]   metoprolol  (TOPROL -XL) 25 MG 24 hr tablet Take 1 tablet (25 mg total) by mouth daily. Do not crush or chew. May be divided.    [provider]   isosorbide  mononitrate (IMDUR ) 30 MG 24 hr tablet Take 1 tablet (30 mg total) by  mouth daily.    [provider]   albuterol -ipratropium (COMBIVENT) 18-103 MCG/ACT inhaler Inhale 2 puffs into the lungs daily. Shake well before each use.    [provider]   melatonin 3 MG Take 1 tablet (3 mg total) by mouth nightly as needed.    [provider]   Ascorbic Acid (VITAMIN C) 250 MG tablet Take 1 tablet (250 mg total) by mouth daily.    [provider]   alprazolam  (XANAX ) 0.5 MG tablet Take 1 tablet (0.5 mg total) by mouth 2 times daily as needed. 03/19/10   [provider]   ezetimibe (ZETIA) 10 MG tablet Take 1 tablet (10 mg total) by mouth daily.    [provider]   aspirin  81 MG EC tablet Take 1 tablet (81 mg total) by mouth daily.    [provider]   Calcium Carbonate-Vitamin D  (CALCIUM + D) 600-200 MG-UNIT per tablet Take 2 tablets by mouth daily.    [provider]   Multiple Vitamin (MULTIVITAMIN) per tablet Take 1 tablet by mouth daily.    [provider]        Allergies[1]    Family History   Problem Relation Age of Onset    Heart Disease Mother     Heart Disease Father     Cancer Sister         Leukemia        Social History[2]      ROS:     I have reviewed, confirmed, and made changes as appropriate.  Details include:    Review of Systems      Constitutional    [+] Weight loss (unsure how much)     [-] Fever / Chills / Malaise/Fatigue / Diaphoresis / Weakness    Skin    [+] Rash (Chronic non burning, non pruritic,seen derm)    HENT    [-] Headaches / Nosebleeds    Cardiovascular    [-] Chest pain / Claudication / Leg swelling    Respiratory    [-] Cough / Hemoptysis    Gastrointestinal    [-] Nausea / Abdominal pain / Diarrhea / Constipation / Blood in stool /   Melena / Vomiting    Genitourinary    [-] Dysuria / Hematuria    Endo/Heme/Aller    [-] Easy bruising/bleeding    Neurological    [-] Dizziness  Edited by:    Theoden Mauch Mon Apr 22, 2024 12:04 PM       Physical Exam:     Vitals:     04/22/24 1100   BP: 147/72   Pulse: 64   Temp: 36.1 C (97 F)   Weight: 53.1 kg (117 lb)   Height: 147.3 cm (4' 9.99)     Wt Readings from Last 3 Encounters:   04/22/24 53.1 kg (117 lb)   09/07/23 60.3 kg (133 lb)   09/03/23 52.4 kg (115 lb 8.3 oz)     BP Readings from Last 3 Encounters:   04/22/24 147/72   09/07/23 105/71   09/05/23 140/80       General: In no apparent distress.  LYMPH:  No cervical lymphadenopathy  Pulmonary: CTA B/L. No wheezes, rhonchi, or rales. Equal Chest rise and fall  Cardiovascular:  RRR. No murmurs, rubs, or gallops.   Abdominal: Soft. Non-distended. Non-tender. Bowel sounds present. No hepatosplenomegaly.  Extremities: No peripheral edema. Pedal pulses 2+ bilaterally. No calf tenderness  Neuro: Awake/alert answering all questions appropriately  Skin: No lesions noted, no petechia or ecchymoses seen.    Labs/Imaging:     Laboratory Data Reviewed:    02/21/24<-- 12/07/23<- 07/13/23  WBC: 5.5<-- 5.3 <-- 5.4  H/H: 15.3/47 <-- 17/52 <-- 16.2/48  Plts: 228<-- 209<-- 222    09/04/23:  Collected: 09/04/23 0447   Result status: Final   Resulting lab: [facility]   Value: NEG   Comment: The results were normal.  We found no evidence of the JAK2 V617F  mutation in this specimen within the sensitivity limits of the analysis  (approximately 0.1%).  This result does not rule out a  myeloproliferative neoplasm.     Radiology Data Reviewed:  No recent imaging.    Assessment/Plan:      PERSEPHONIE HEGWOOD is a 78 y.o. female with PMH significant for CAD, HTN, bipolar, COPD, chronic tobacco use presenting for initial evaluation of polycythemia.    Based on prior workup, erythrocytosis is likely related to a secondary etiology given her ongoing COPD and tobacco use disorder.  She has had JAK2 testing previously x 2, both of which are negative.  She has also had previous erythropoietin  levels checked which were within the normal range, further confirming likely secondary  polycythemia.    Notably on her most recent labs, her H&H is within normal limits.      Overall, her erythrocytosis is longstanding, and been secondary previously.  Will plan to recheck labs and recheck in erythropoietin  on her, however if this is truly secondary erythrocytosis, barring any symptomatic changes, would favor just close monitoring.  Likely this is driven by her ongoing tobacco use disorder and known COPD.  Additionally, patient is resistant to any additional diagnostic testing as she does not feel it would change management since she is 27 and is not very interested in screening for condition such as lung cancer, colon cancer, breast cancer.      Plan:  # Likely secondary erythrocytosis in the setting of tobacco use disorder/COPD  Most recent labs show H&H within normal limits  Will repeat labs today with CBC and erythropoietin   No additional lab testing recommended at this time.  Discussed with patient if she was to become symptomatic such as headaches, or erythemalgia, we would consider phlebotomy, however she remains symptomatic at this time    Return to clinic in 6 months.  1. Erythrocytosis (Primary)    2. Chronic obstructive pulmonary disease, unspecified COPD type    3. Tobacco use disorder      Plan:  Orders Placed This Encounter   Procedures    CBC and differential    Erythropoietin          Paublo Warshawsky, DO 04/22/2024 12:15 PM  Hematology      I spent 64 minutes this calendar day on this visit.  This time includes, but it is not limited to, pre-charting, reviewing previous notes and results, discussing with other providers if indicated, seeing the patient and documentation of the visit, independently interpreting results and communicating results to the patient/family/caregiver, and care coordination.           [1]   Allergies  Allergen Reactions    Cephalosporins Other (See Comments)     Severe confusion    Seasonal Allergies Itching    Acid Blockers Support Rash    Bee Venom  Other (See Comments)     Hornets    Proton Pump Inhibitors Rash   [2]   Social History  Socioeconomic History    Marital status: Divorced   Tobacco Use    Smoking status: Every Day     Packs/day: 1.00     Years: 50.00     Additional pack years: 0.00     Total pack years: 50.00     Types: Cigars, Cigarettes    Smokeless tobacco: Never    Tobacco comments:     Cigars 1 pack per day   Substance and Sexual Activity    Alcohol use: No    Drug use: No    Sexual activity: Not Currently

## 2024-04-22 ENCOUNTER — Ambulatory Visit: Payer: Medicare (Managed Care) | Attending: Internal Medicine | Admitting: Internal Medicine

## 2024-04-22 ENCOUNTER — Other Ambulatory Visit: Payer: Self-pay

## 2024-04-22 ENCOUNTER — Other Ambulatory Visit
Admission: RE | Admit: 2024-04-22 | Discharge: 2024-04-22 | Disposition: A | Discharge status: Home / Self Care | Payer: Medicare (Managed Care) | Source: Ambulatory Visit | Attending: Internal Medicine | Admitting: Internal Medicine

## 2024-04-22 ENCOUNTER — Encounter: Payer: Self-pay | Admitting: Internal Medicine

## 2024-04-22 VITALS — BP 147/72 | HR 64 | Temp 97.0°F | Ht <= 58 in | Wt 117.0 lb

## 2024-04-22 DIAGNOSIS — D751 Secondary polycythemia: Secondary | ICD-10-CM | POA: Insufficient documentation

## 2024-04-22 DIAGNOSIS — F17200 Nicotine dependence, unspecified, uncomplicated: Secondary | ICD-10-CM | POA: Insufficient documentation

## 2024-04-22 DIAGNOSIS — J449 Chronic obstructive pulmonary disease, unspecified: Secondary | ICD-10-CM | POA: Insufficient documentation

## 2024-04-22 LAB — CBC AND DIFFERENTIAL
Baso # K/uL: 0.1 THOU/uL (ref 0.0–0.2)
Eos # K/uL: 0.1 THOU/uL (ref 0.0–0.5)
Hematocrit: 47 % (ref 34–49)
Hemoglobin: 14.8 g/dL (ref 11.2–16.0)
IMM Granulocytes #: 0 THOU/uL
IMM Granulocytes: 0.2 %
Lymph # K/uL: 2.1 THOU/uL (ref 1.0–5.0)
MCV: 91 fL (ref 75–100)
Mono # K/uL: 0.5 THOU/uL (ref 0.1–1.0)
Neut # K/uL: 1.9 THOU/uL (ref 1.5–6.5)
Nucl RBC # K/uL: 0 THOU/uL
Nucl RBC %: 0 /100{WBCs} (ref 0.0–0.2)
Platelets: 231 THOU/uL (ref 150–450)
RBC: 5.2 MIL/uL (ref 4.0–5.5)
RDW: 14.5 % (ref 0.0–15.0)
Seg Neut %: 40.1 %
WBC: 4.7 THOU/uL (ref 3.5–11.0)

## 2024-04-24 LAB — ERYTHROPOIETIN: Erythropoietin: 9 mU/mL (ref 4–27)

## 2024-05-29 ENCOUNTER — Ambulatory Visit: Payer: Self-pay | Admitting: Internal Medicine

## 2024-07-26 ENCOUNTER — Encounter: Payer: Self-pay | Admitting: Internal Medicine

## 2024-08-23 LAB — UNMAPPED LAB RESULTS
Basophil # (HT): 0 10 3/uL (ref 0.0–0.2)
Basophil % (HT): 0 % (ref 0–2)
Eosinophil # (HT): 0.1 10 3/uL (ref 0.0–0.5)
Eosinophil % (HT): 1 % (ref 0–7)
Hematocrit (HT): 47 % (ref 34–47)
Hemoglobin (HGB) (HT): 14.8 g/dL (ref 11.5–16.0)
Lymphocyte # (HT): 1.8 10 3/uL (ref 0.9–3.8)
Lymphocyte % (HT): 26 % (ref 17–44)
MCHC (HT): 31.4 g/dL — ABNORMAL LOW (ref 32.0–36.0)
MCV (HT): 89.2 fL (ref 81.0–99.0)
Mean Corpuscular Hemoglobin (MCH) (HT): 28 pg (ref 26.0–34.0)
Monocyte # (HT): 0.6 10 3/uL (ref 0.2–1.0)
Monocyte % (HT): 9 % (ref 4–12)
Neutrophil # (HT): 4.4 10 3/uL (ref 1.5–7.7)
Platelets (HT): 227 10 3/uL (ref 150–450)
RBC (HT): 5.28 10 6/uL — ABNORMAL HIGH (ref 3.80–5.20)
RDW (HT): 14.2 % (ref 11.5–15.0)
Seg Neut % (HT): 64 % (ref 40–75)
WBC (HT): 6.9 10 3/uL (ref 4.0–10.8)

## 2024-09-06 ENCOUNTER — Other Ambulatory Visit: Payer: Self-pay | Admitting: Physician Assistant

## 2024-09-06 ENCOUNTER — Ambulatory Visit
Admission: RE | Admit: 2024-09-06 | Discharge: 2024-09-06 | Disposition: A | Discharge status: Home / Self Care | Payer: Medicare (Managed Care) | Source: Ambulatory Visit

## 2024-09-06 ENCOUNTER — Ambulatory Visit
Admission: RE | Admit: 2024-09-06 | Discharge: 2024-09-06 | Disposition: A | Discharge status: Home / Self Care | Payer: Medicare (Managed Care) | Source: Ambulatory Visit | Attending: Physician Assistant | Admitting: Physician Assistant

## 2024-09-06 DIAGNOSIS — M25511 Pain in right shoulder: Secondary | ICD-10-CM | POA: Insufficient documentation

## 2024-09-06 DIAGNOSIS — M85811 Other specified disorders of bone density and structure, right shoulder: Secondary | ICD-10-CM

## 2024-09-06 DIAGNOSIS — M25512 Pain in left shoulder: Secondary | ICD-10-CM | POA: Insufficient documentation

## 2024-09-06 DIAGNOSIS — M19011 Primary osteoarthritis, right shoulder: Secondary | ICD-10-CM

## 2024-09-06 DIAGNOSIS — M85812 Other specified disorders of bone density and structure, left shoulder: Secondary | ICD-10-CM | POA: Insufficient documentation

## 2024-09-19 ENCOUNTER — Ambulatory Visit: Payer: Medicare (Managed Care) | Admitting: Anesthesiology

## 2024-09-19 ENCOUNTER — Encounter
Admission: RE | Disposition: A | Discharge status: Home / Self Care | Payer: Self-pay | Source: Ambulatory Visit | Attending: Ophthalmology

## 2024-09-19 ENCOUNTER — Ambulatory Visit
Admission: RE | Admit: 2024-09-19 | Discharge: 2024-09-19 | Disposition: A | Payer: Medicare (Managed Care) | Source: Ambulatory Visit | Attending: Ophthalmology | Admitting: Ophthalmology

## 2024-09-19 DIAGNOSIS — H2512 Age-related nuclear cataract, left eye: Secondary | ICD-10-CM

## 2024-09-19 DIAGNOSIS — I10 Essential (primary) hypertension: Secondary | ICD-10-CM | POA: Insufficient documentation

## 2024-09-19 DIAGNOSIS — F17210 Nicotine dependence, cigarettes, uncomplicated: Secondary | ICD-10-CM | POA: Insufficient documentation

## 2024-09-19 DIAGNOSIS — J449 Chronic obstructive pulmonary disease, unspecified: Secondary | ICD-10-CM | POA: Insufficient documentation

## 2024-09-19 SURGERY — EXTRACTION, CATARACT, EXTRACAPSULAR, USING SMALL MANUAL INCISION
Anesthesia: Monitor Anesthesia Care | Site: Eye | Laterality: Left | Wound class: Clean

## 2024-09-19 MED ORDER — MOXIFLOXACIN HCL 0.5 % OP SOLN *I*
OPHTHALMIC | Status: DC | PRN
  Administered 2024-09-19: 10 [drp] via TOPICAL

## 2024-09-19 MED ORDER — TRIAMCINOLONE ACETONIDE 10 MG/ML IJ SUSP (JMH OR CATARACT)*I*
INTRAMUSCULAR | Status: DC | PRN
  Administered 2024-09-19: .4 mL

## 2024-09-19 MED ORDER — MIDAZOLAM HCL 1 MG/ML IJ SOLN *I* WRAPPED
INTRAMUSCULAR | Status: DC | PRN
  Administered 2024-09-19: 2 mg via INTRAVENOUS

## 2024-09-19 MED ORDER — SODIUM CHLORIDE 0.9 % INJ (FLUSH) WRAPPED (FOR OSM ONLY) *I*
Status: DC | PRN
  Administered 2024-09-19: 10 mL via INTRAVENOUS

## 2024-09-19 MED ORDER — PROPARACAINE HCL 0.5 % OP SOLN *I*
1.0000 [drp] | OPHTHALMIC | Status: AC
  Administered 2024-09-19 (×2): 1 [drp] via OPHTHALMIC

## 2024-09-19 MED ORDER — EPINEPHRINE 1 MG/ML IJ SOLUTION WRAPPED FOR IRRIGATION (NO HCPCS) *I*
INTRAMUSCULAR | Status: DC | PRN
  Administered 2024-09-19: .5 mL

## 2024-09-19 MED ORDER — TRIAMCINOLONE ACETONIDE 10 MG/ML IJ SUSP (JMH OR CATARACT)*I*: 4.0000 mg | Freq: Once | INTRAMUSCULAR | Status: DC

## 2024-09-19 MED ORDER — KETOROLAC TROMETHAMINE 0.5 % OP SOLN *I*
1.0000 [drp] | OPHTHALMIC | Status: AC
  Administered 2024-09-19: 1 [drp] via OPHTHALMIC

## 2024-09-19 MED ORDER — MIDAZOLAM HCL 1 MG/ML IJ SOLN *I* WRAPPED
INTRAMUSCULAR | Status: AC
  Filled 2024-09-19: qty 2

## 2024-09-19 MED ORDER — PHENYLEPHRINE HCL 2.5 % OP SOLN *I*
1.0000 [drp] | OPHTHALMIC | Status: AC
  Administered 2024-09-19: 1 [drp] via OPHTHALMIC

## 2024-09-19 MED ORDER — MOXIFLOXACIN HCL 0.5 % OP SOLN *I*
1.0000 [drp] | OPHTHALMIC | Status: AC
  Administered 2024-09-19 (×2): 1 [drp] via OPHTHALMIC

## 2024-09-19 MED ORDER — TROPICAMIDE 1 % OP SOLN *I*
1.0000 [drp] | OPHTHALMIC | Status: AC
  Administered 2024-09-19 (×2): 1 [drp] via OPHTHALMIC

## 2024-09-19 SURGICAL SUPPLY — 8 items
BETADINE SOL 4OZ 10PCT SEALED (Supply) ×1 IMPLANT
CARTRIDGE MONARCH II C (Supply) ×1 IMPLANT
GLOVE SURG PROTEXIS PI 7.5 PF SYN (Glove) ×1 IMPLANT
LENS IOL SY60WF 21.5 CLAREON MONOFOCAL BLF (Implant) IMPLANT
OPTHALMIC CUSTOM SURGICAL PACK (Pack) ×1 IMPLANT
SOL DISCOVISC INTRAOCULAR SYR 1ML (Solution) ×1 IMPLANT
SOL H2O IRRIG 500ML STERILE BTL (Solution) ×1 IMPLANT
SYRINGE SLIP TIP 1CC LF (Syringe) ×1 IMPLANT

## 2024-09-19 NOTE — Anesthesia Postprocedure Evaluation (Signed)
 Anesthesia Post-Op NotePatient: Kylie HERO AllenProcedure(s) Performed:Procedure SummaryDate:09/19/2024 Anesthesia Start: 09/19/2024 10:25 AM Anesthesia Stop: 09/19/2024 10:44 AM Room / Location:[id number] / [facility] Procedure(s):EXTRACTION, CATARACT, EXTRACAPSULAR, USING SMALL MANUAL INCISION Diagnosis:Age-related nuclear cataract of left eye [H25.12] Surgeon(s):Benham, Alverna, MD Responsible Anesthesia Provider:Shye Doty, MD Recovery VitalsBP: (!) 157/93 (09/19/2024  9:25 AM)Heart Rate: 80 (09/19/2024  9:25 AM)Resp: 18 (09/19/2024  9:25 AM)Temp: 36.2 C (97.2 F) (09/19/2024  9:25 AM)SpO2: 93 % (09/19/2024  9:25 AM)0-10  Pain Scale: 0 (09/19/2024  9:25 AM)Anesthesia type:MACComplications Noted During Procedure or in PACU:None Comment:  Patient Location:PACULevel of Consciousness:  Recovered to baseline, awake, alert and orientedPatient Participation:   Able to participateTemperature Status:  NormothermicOxygen Saturation:  Within patient's normal rangeCardiac Status: within patient's normal rangeFluid Status:  StableAirway Patency:   YesPulmonary Status:  BaselineNausea and Vomiting:None  Post Op Assessment:  Tolerated procedure wellResponsible Anesthesia Provider Attestation:All indicated post anesthesia care provided -

## 2024-09-19 NOTE — Anesthesia Case Conclusion (Signed)
 CASE CONCLUSION  Emergence  Criteria Used for Airway Removal:  Adequate Tv & RR, acceptable O2 saturation and following commands  Assessment:  Routine  Transport  Directly to: PACU  Airway:  Nasal cannula  Oxygen Delivery:  2 lpm  Monitoring:  Pulse oximetry  Position:  Supine  Patient Condition on Handoff  Level of Consciousness:  Alert/talking/calm  Patient Condition:  Stable  Handoff Report to:  RN

## 2024-09-19 NOTE — Discharge Instructions (Signed)
 Cataract Removal       WHAT YOU NEED TO KNOW:   Cataract removal is a procedure to remove a cloudy lens from your eye. An artificial lens called an intraocular lens (IOL) is put in its place. This will improve your vision.   DISCHARGE INSTRUCTIONS:   Seek care immediately if:   You suddenly see flashes or floaters, followed by partial vision loss like a curtain covered your eye.     Your eye suddenly goes blind.     You have severe eye pain with eye redness, swelling, new floaters, and more blurry vision.    Contact your ophthalmologist if:   You have changes in your vision.    Your eye pain increases.    Your eye is red, swollen, or draining fluid or pus.     You have a headache or nausea, or you are vomiting.     You have questions or concerns about your condition or care.      Follow up with your ophthalmologist within 24 hours and as directed:  You will need to have your eye checked. Write down your questions so you remember to ask them during your visits.  Eye care:   Wear your eye shield when you sleep to prevent damage.         Do not rub your eye.     Keep dust, dirt, and water  out of your eye to prevent infection.     Do not lift heavy objects or bend over. Both can increase the pressure in your eye. Ask your healthcare provider how much weight is safe for you to lift. You may be told not to lift anything heavier than 25 pounds for 3 weeks after your procedure.     Wear UVB protective sunglasses and a brimmed hat outside to help prevent sun damage to your eyes.    If you smoke, it is never too late to quit. Smoking can damage your eye and prevent it from healing after your procedure. Do not smoke or let anyone smoke around you. Ask your healthcare provider for information if you need help quitting.

## 2024-09-19 NOTE — Preop H&P (Signed)
 UPDATES TO PATIENT'S CONDITION on the DAY OF SURGERY/PROCEDUREI. Updates to Patient's Condition (to be completed by a provider privileged to complete a H&P, following reassessment of the patient by the provider):Day of Surgery/Procedure Update:HistoryHistory reviewed and no changePhysicalPhysical exam updated and no change  II. Procedure Readiness I have reviewed the patient's H&P and updated condition. By completing and signing this form, I attest that this patient is ready for surgery/procedure.III. Attestation I have reviewed the updated information regarding the patient's condition and it is appropriate to proceed with the planned surgery/procedure.I have reminded Kylie Parker that COVID-19 is still present in our community. She was advised that [facility] and its affiliates have made deliberate and widespread changes to policies and procedures, consistent with applicable directives, in order to reduce the risk of exposure in our facilities. I further explained and Kylie Parker understands that given the communicability of the SARS CoV2 coronavirus, there remains a small but real risk of contracting the disease while receiving perioperative care - even with stringent preventive measures in place. Kylie Parker understands the potential consequences of COVID-19 disease as it relates to their planned procedure and anticipated postoperative course.  The patient and I have considered and discussed the relative risks and benefits of proceeding with his/her surgery - both in terms of the procedure itself, and also in the context of the ongoing pandemic.  Kylie Parker wishes to proceed with the procedure. Kylie POISSON, MD as of 10:07 AM 09/19/2024

## 2024-09-19 NOTE — Anesthesia Preprocedure Evaluation (Addendum)
 Anesthesia Pre-operative History and Physical for Kylie HERO AllenHighlighted Issues for this Procedure:78 y.o. female with Age-related nuclear cataract of left eye [H25.12] presenting for Procedure(s) (LRB):EXTRACTION, CATARACT, EXTRACAPSULAR, USING SMALL MANUAL INCISION (Left) by Surgeon(s):Benham, Alverna, MD scheduled for 28 minutes.BMI Readings from Last 1 Encounters:04/22/24 : 24.46 kg/mStress Test/Echocardiography:11/18/2024Interpretation Summary Show Result ComparisonTechnically limited study.  Limited diagnostic imaging. Small LV volumes.  Hyperdynamic LVEF without regional wall motion abnormalities. Aortic valve and mitral annular sclerosis without significant stenosis. Grossly normal RV size and function. Estimated mild to moderately elevated RV systolic pressure. No prior study available for comparison.Electrophysiology/AICD/Pacer:11/21/2024ImpressionSinus rhythm.Anesthesia Evaluation Information Source: records, patient   ANESTHESIA HISTORY     Denies anesthesia historyPertinent(-):  No History of anesthetic complicationsGENERAL     Denies general issuesPertinent (-):  No obesity or history of anesthetic complicationsHEENT  + Visual Impairment PULMONARY  + COPD  + Shortness of BreathCARDIOVASCULAR  + Hypertension  + Angina NEURO/PSYCH/ORTHO  + Chronic pain        fibromyalgia  + Neuropsychiatric Issues        bipolar, ADD/ADHDHEMATOLOGIC  + Blood dyscrasia        hyperlipidemia  + Arthritis Physical ExamAirway          Mouth opening: normal          Mallampati: III          TM distance (fb): >3 FB          TM distance (cm): 4          Neck ROM: fullDental  Upper: edentulous Lower: edentulous Cardiovascular  Normal Exam         Rhythm: regular         Rate: normalNeurologic    Normal ExamGeneral Survey    Normal Exam Pulmonary   Normal Exam   breath sounds clear to auscultationMental Status   Normal Exam  oriented to person, place and time ________________________________________________________________________PLANASA Score  3Anesthetic Plan MAC Induction (routine IV) General Anesthesia/Sedation Maintenance Plan (IV bolus); Airway (nasal cannula); Monitoring (standard ASA); Positioning (supine); Pain (per surgical team and intraop local); PostOp (PACU)Standard AttestationInformed Consent   Risks:        Risks discussed were commensurate with the plan listed above with the following specific points: N/V, fatigue, aspiration, sore throat, hypotension, headache, infection, emergence delirium, dizziness and unsteadiness, Damage to: eyes, nerves, teeth and blood vessels, unexpected serious injury, allergic Rx, awareness and death.  Anesthetic Consent:       Anesthetic plan (and risks as noted above) were discussed with patient  Blood products Consent:      Use of blood products discussed with: patient and they consented  Plan also discussed with team members including:     attendingResponsible Anesthesia Provider Attestation:I attest that the patient or proxy understands and accepts the risks and benefits of the anesthesia plan. I also attest that I have personally performed a pre-anesthetic examination and evaluation, and prescribed the anesthetic plan for this particular location within 48 hours prior to the anesthetic as documented. Manpreet Kemmer, MD  09/19/2024, 10:21 AM

## 2024-09-19 NOTE — Anesthesia Procedure Notes (Signed)
---------------------------------------------------------------------------------------------------------------------------------------  AIRWAY GENERAL INFORMATION AND STAFF  Patient location during procedure: OR     Date of Procedure: 09/19/2024 10:31 AMCONDITION PRIOR TO MANIPULATION   Current Airway/Neck Condition:  Normal      For more airway physical exam details, see Anesthesia PreOp EvaluationAIRWAY METHOD   Patient Position:  Sniffing  Preoxygenated: yes    Maintained In-Line Stability: not needed, normal c-spine condition        To see details of medications used, see MAR  Induction: IV  Mask Difficulty Assessment:  0 - not attemptedFINAL AIRWAY DETAILS  Final Airway Type:  Nasal cannula----------------------------------------------------------------------------------------------------------------------------------------

## 2024-09-19 NOTE — Op Note (Signed)
 Operative Note    Date of Surgery: 09/19/2024   Surgeons: Surgeons and Role:   DEWAINE Luella Million, MD - Primary   Pre-op Diagnosis: Pre-Op Diagnosis Codes:    * Age-related nuclear cataract of left eye [H25.12]   Post-op Diagnosis: Post-Op Diagnosis Codes:   * Age-related nuclear cataract of left eye [H25.12]   Procedure(s) Performed: Procedure(s) (LRB):EXTRACTION, CATARACT, EXTRACAPSULAR, USING SMALL MANUAL INCISION (Left)   Anesthesia Type: Monitor Anesthesia Care    Fluid Totals: I/O this shift:12/04 0700 - 12/04 1459In: 10 (0.2 mL/kg) [IV Piggyback:10]Out: 0 (0 mL/kg) Net: 10Weight: 55.8 kg    Estimated Blood Loss: * No values recorded between 12/24/2020  7:47 AM and 12/24/2020  8:21 AM *   Specimens to Pathology:  * No specimens in log *   Temporary Implants:    Packing:           Patient Condition: good Indications: The patient had a decline in their vision secondary to their cataract such that it interfered with their activities of daily living and warranted extraction.Surgeon's Narrative:Prior to the day of surgery the patient was informed of the risks, benefits, and alternatives of cataract surgery. Written informed consent was obtained. On the day of surgery, the patient was examined in the pre-operative staging area and the correct operative eye was marked.The patient was transported to the operating suite and placed in the supine position. Cardiac monitors were placed. A time out was called and all members of the operating team confirmed the correct operative eye, and that cataract surgery was the correct procedure. The eye was prepped and draped in the usual sterile fashion for ophthalmic surgery.  A lid speculum was placed to separate the eyelids.  A 1.0 mm paracentesis was made for the surgeon's left hand. Intracameral lidocaine  was injected into the anterior chamber.  The anterior chamber was formed with viscoelastic.  A 2.5 mm keratome was used to create a limbal corneal incision. A continuous curvilinear capsulorhexis was initiated with a cystotome and completed with Utrata forceps. Hydrodissection was performed with balanced salt solution on a flat cannula and a good fluid wave was visualized posterior to the lens. The phacoemulsification handpiece was primed and placed into the anterior chamber. The lens nucleus was divided into quadrants and removed with a divide and conquer technique. Following removal of the nucleus, the cortex was removed with the irrigation/aspiration handpiece.  Viscoelastic was  used to inflate the capsular bag. The lens was placed into the capsular bag. Residual viscoelastic was removed with the irrigation and aspiration handpiece. The lens implant was confirmed to be well centered. Intracameral antibiotic was injected into the anterior chamber.  At the conclusion of the case, the wounds were hydrated with BSS.  The wounds were inspected and were found to be watertight. The anterior chamber remained formed and Weck-cel Sponge was used to ensure adequate intraocular pressure.At that point 4mg  of kenalog  was injected subconjunctival in the inferior fornix. The lid speculum was removed, then the eye was cleaned and shielded. The patient tolerated the procedure well, there were no complications, and the patient was returned to the recovery room in stable condition.

## 2024-10-03 ENCOUNTER — Encounter: Payer: Self-pay | Admitting: Certified Registered"

## 2024-10-03 ENCOUNTER — Encounter: Admission: RE | Payer: Self-pay | Source: Ambulatory Visit

## 2024-10-03 ENCOUNTER — Ambulatory Visit: Admission: RE | Admit: 2024-10-03 | Payer: Medicare (Managed Care) | Source: Ambulatory Visit | Admitting: Ophthalmology

## 2024-10-03 SURGERY — EXTRACTION, CATARACT, EXTRACAPSULAR, USING SMALL MANUAL INCISION
Anesthesia: Monitor Anesthesia Care | Site: Eye | Laterality: Right | Wound class: Clean

## 2024-10-03 MED ORDER — PROPARACAINE HCL 0.5 % OP SOLN *I*: 1.0000 [drp] | OPHTHALMIC | Status: AC

## 2024-10-03 MED ORDER — KETOROLAC TROMETHAMINE 0.5 % OP SOLN *I*: 1.0000 [drp] | OPHTHALMIC | Status: AC

## 2024-10-03 MED ORDER — TRIAMCINOLONE ACETONIDE 10 MG/ML IJ SUSP (JMH OR CATARACT)*I*: 4.0000 mg | Freq: Once | INTRAMUSCULAR | Status: AC

## 2024-10-03 MED ORDER — PHENYLEPHRINE HCL 2.5 % OP SOLN *I*: 1.0000 [drp] | OPHTHALMIC | Status: AC

## 2024-10-03 MED ORDER — TROPICAMIDE 1 % OP SOLN *I*: 1.0000 [drp] | OPHTHALMIC | Status: AC

## 2024-10-03 MED ORDER — MOXIFLOXACIN HCL 0.5 % OP SOLN *I*: 1.0000 [drp] | OPHTHALMIC | Status: AC

## 2024-10-03 NOTE — Preop H&P (Signed)
 UPDATES TO PATIENT'S CONDITION on the DAY OF SURGERY/PROCEDUREI. Updates to Patient's Condition (to be completed by a provider privileged to complete a H&P, following reassessment of the patient by the provider):Day of Surgery/Procedure Update:HistoryHistory reviewed and no changePhysicalPhysical exam updated and no change  II. Procedure Readiness I have reviewed the patient's H&P and updated condition. By completing and signing this form, I attest that this patient is ready for surgery/procedure.III. Attestation I have reviewed the updated information regarding the patient's condition and it is appropriate to proceed with the planned surgery/procedure.I have reminded Ms. Kuroda that COVID-19 is still present in our community. She was advised that [facility] and its affiliates have made deliberate and widespread changes to policies and procedures, consistent with applicable directives, in order to reduce the risk of exposure in our facilities. I further explained and Ms. Moro understands that given the communicability of the SARS CoV2 coronavirus, there remains a small but real risk of contracting the disease while receiving perioperative care - even with stringent preventive measures in place. Ms. Dedmon understands the potential consequences of COVID-19 disease as it relates to their planned procedure and anticipated postoperative course.  The patient and I have considered and discussed the relative risks and benefits of proceeding with his/her surgery - both in terms of the procedure itself, and also in the context of the ongoing pandemic.  Ms. Castilla wishes to proceed with the procedure. ALVERNA POISSON, MD as of 10:00 AM 10/03/2024

## 2024-10-03 NOTE — Anesthesia Preprocedure Evaluation (Signed)
 Anesthesia Pre-operative History and Physical for Kylie HERO AllenHighlighted Issues for this Procedure:78 y.o. female with Age-related nuclear cataract of right eye [H25.11] presenting for Procedure(s) (LRB):EXTRACTION, CATARACT, EXTRACAPSULAR, USING SMALL MANUAL INCISION (Right) by Surgeon(s):Benham, Alverna, MD scheduled for 27 minutes.BMI Readings from Last 1 Encounters:09/10/24 : 25.71 kg/mStress Test/Echocardiography:===   ===ECHO COMPLETE 09/04/2023- Interpretation Summary -Technically limited study.  Limited diagnostic imaging.Small LV volumes.  Hyperdynamic LVEF without regional wall motion abnormalities.Aortic valve and mitral annular sclerosis without significant stenosis.Grossly normal RV size and function.Estimated mild to moderately elevated RV systolic pressure.No prior study available for comparison.Electrophysiology/AICD/Pacer:ImpressionSinus rhythm.Anesthesia Evaluation HEENT  + Visual Impairment PULMONARY  + COPD  + Shortness of BreathCARDIOVASCULAR  + Hypertension  + Angina  + CAD NEURO/PSYCH/ORTHO  + Neuropsychiatric Issues        bipolarHEMATOLOGIC  + Arthritis Physical ExamAirway          Mouth opening: normal          Airway Impression: easy Cardiovascular  Normal Exam         Rhythm: regular         Rate: normalNeurologic    Normal ExamGeneral Survey    Normal Exam Pulmonary   Normal Exam  breath sounds clear to auscultationMental Status   Normal Exam  oriented to person, place and timeOperative Site    Normal Exam ________________________________________________________________________PLANASA Score  3Anesthetic Plan MAC Induction (routine IV) General Anesthesia/Sedation Maintenance Plan (IV bolus);  Airway Manipulation (none); Airway (nasal cannula); Line ( use current access); Monitoring (standard ASA); Positioning (supine); Pain  (per surgical team); PostOp (ASC)Standard AttestationInformed Consent   Risks:        Risks discussed were commensurate with the plan listed above with the following specific points: N/V, aspiration, sore throat and hypotension, Damage to: eyes, nerves, teeth and blood vessels, allergic Rx, unexpected serious injury and awareness.  Anesthetic Consent:       Anesthetic plan (and risks as noted above) were discussed with patientResponsible Anesthesia Provider Attestation:I attest that the patient or proxy understands and accepts the risks and benefits of the anesthesia plan. I also attest that I have personally performed a pre-anesthetic examination and evaluation, and prescribed the anesthetic plan for this particular location within 48 hours prior to the anesthetic as documented. Oneil Deward Sar, CRNA  10/03/2024, 7:40 AM

## 2024-10-04 ENCOUNTER — Encounter: Payer: Self-pay | Admitting: Ophthalmology

## 2024-10-24 ENCOUNTER — Other Ambulatory Visit: Payer: Self-pay | Admitting: Oncology

## 2024-10-24 ENCOUNTER — Telehealth: Payer: Self-pay | Admitting: Internal Medicine

## 2024-10-24 ENCOUNTER — Telehealth: Payer: Self-pay

## 2024-10-24 DIAGNOSIS — D751 Secondary polycythemia: Secondary | ICD-10-CM

## 2024-10-24 NOTE — Telephone Encounter (Signed)
 Left message to remind PT to get labs drawn before her appt on Monday 1/12 at 2pm

## 2024-10-24 NOTE — Telephone Encounter (Signed)
 Pt calling- thinks apt 1/12 should be cancelled as she does not have enough time to have labs drawn. Asking for call back- Verified number.

## 2024-10-28 ENCOUNTER — Ambulatory Visit: Payer: Medicare (Managed Care) | Admitting: Oncology
